# Patient Record
Sex: Male | Born: 1940 | Race: White | Hispanic: No | Marital: Married | Smoking: Former smoker
Health system: Southern US, Community
[De-identification: ages and names within clinical notes are randomized; demographics above are authoritative.]

## PROBLEM LIST (undated history)

## (undated) DIAGNOSIS — K219 Gastro-esophageal reflux disease without esophagitis: Secondary | ICD-10-CM

## (undated) DIAGNOSIS — I444 Left anterior fascicular block: Principal | ICD-10-CM

## (undated) DIAGNOSIS — E119 Type 2 diabetes mellitus without complications: Secondary | ICD-10-CM

## (undated) DIAGNOSIS — M1990 Unspecified osteoarthritis, unspecified site: Secondary | ICD-10-CM

## (undated) DIAGNOSIS — E7800 Pure hypercholesterolemia, unspecified: Secondary | ICD-10-CM

## (undated) DIAGNOSIS — M109 Gout, unspecified: Secondary | ICD-10-CM

## (undated) DIAGNOSIS — I10 Essential (primary) hypertension: Secondary | ICD-10-CM

## (undated) DIAGNOSIS — I2510 Atherosclerotic heart disease of native coronary artery without angina pectoris: Secondary | ICD-10-CM

## (undated) HISTORY — DX: Essential (primary) hypertension: I10

## (undated) HISTORY — PX: BACK SURGERY: SHX140

## (undated) HISTORY — PX: LUMBAR DISC SURGERY: SHX700

## (undated) HISTORY — DX: Gastro-esophageal reflux disease without esophagitis: K21.9

## (undated) HISTORY — DX: Pure hypercholesterolemia, unspecified: E78.00

## (undated) HISTORY — PX: INGUINAL HERNIA REPAIR: SUR1180

## (undated) HISTORY — PX: CARPAL TUNNEL RELEASE: SHX101

## (undated) HISTORY — DX: Left anterior fascicular block: I44.4

## (undated) HISTORY — PX: SHOULDER OPEN ROTATOR CUFF REPAIR: SHX2407

---

## 1999-02-27 ENCOUNTER — Observation Stay (HOSPITAL_COMMUNITY): Admission: RE | Admit: 1999-02-27 | Discharge: 1999-02-28 | Payer: Self-pay | Admitting: Orthopedic Surgery

## 2000-07-18 ENCOUNTER — Inpatient Hospital Stay (HOSPITAL_COMMUNITY): Admission: EM | Admit: 2000-07-18 | Discharge: 2000-07-19 | Payer: Self-pay | Admitting: Emergency Medicine

## 2000-07-18 ENCOUNTER — Encounter: Payer: Self-pay | Admitting: Emergency Medicine

## 2000-07-19 ENCOUNTER — Encounter: Payer: Self-pay | Admitting: Interventional Cardiology

## 2007-10-28 ENCOUNTER — Ambulatory Visit (HOSPITAL_COMMUNITY): Admission: RE | Admit: 2007-10-28 | Discharge: 2007-10-28 | Payer: Self-pay | Admitting: Surgery

## 2008-03-30 HISTORY — PX: CHOLECYSTECTOMY: SHX55

## 2010-08-15 NOTE — H&P (Signed)
[place]  Patient:    Steven Yates, Steven Yates                    MRN: [id number] Adm. Date:  [id number] Attending:  Lyn Records. Iii CC:         Darci Needle, M.D.  Brunilda Payor, M.D.   History and Physical  HISTORY OF PRESENT ILLNESS:  Mr. Dinneen is a 70 year old white gentleman with a history of gastroesophageal reflux and chest pain.  He is admitted with worsening chest pain over the past several days.  The patient has a history of chest pain in the past.  He had a heart catheterization by Dr. Katrinka Blazing in 1994.  He was found to have smooth and normal coronary arteries.  The patient has done fairly well over the years.  About one or two months ago, he started having exertional chest pain and exertional dyspnea.  These symptoms have worsened over the past several weeks.  Friday [place], [place] and had fairly significant episodes of exertional angina and exertional shortness of breath.  He had chest pain in the center of the chest radiating to his left shoulder and out his left arm. The pains resolved.  Last night, he awoke with similar episodes.  The pain persisted for an hour or so and he presented to the emergency room this morning.  He has been pain free since arriving at the emergency room.  The patient has been relatively active.  He works a lot out in his yard.  He has had some increasing shortness of breath and chest pain with his usual yard work.  CURRENT MEDICATIONS:  Prilosec 20 mg a day.  ALLERGIES:  He has no known drug allergies.  PAST MEDICAL HISTORY: 1. History of chest pain. 2. Gastroesophageal reflux.  SOCIAL HISTORY:  The patient smokes cigars occasionally.  He does not drink alcohol.  FAMILY HISTORY:  His brother has a history of coronary artery disease and is status post CABG.  REVIEW OF SYSTEMS:  Consistent with gastroesophageal reflux disease.  He has occasional episodes of  constipation.  PHYSICAL EXAMINATION:  GENERAL:  He is a middle-aged male in no acute distress.  He is alert and oriented x 3 and his mood and affect are normal.  VITAL SIGNS:  Temperature 98.3, blood pressure 152/79, heart rate 54.  HEENT:  2+ carotids.  There is no JVD.  NECK:  There is no thyromegaly and no lymphadenopathy.  LUNGS:  Clear to auscultation.  BACK:  Nontender.  HEART:  Regular rate.  S1, S2.  He has no murmurs, gallops, or rubs.  His PMI is non-displaced.  ABDOMEN:  Good bowel sounds.  He has no areas of tenderness and no hepatosplenomegaly.  EXTREMITIES:  He has no calf tenderness.  His pulses are 1-2+ and symmetric. There is no clubbing, cyanosis, or edema.  NEUROLOGIC:  Cranial nerves II-XII are intact and his motor and sensory functions are intact.  His gait was not assessed.  LABORATORY DATA:  His CPK is 187 with 4.0 MB.  His troponin is 0.01.  His CBC and his Chem-7 are within normal limits.  His EKG reveals sinus bradycardia with no ST or T wave changes.  IMPRESSION:  The patient presents with episodes of exertional chest pain. These symptoms are consistent with angina or possibly gastroesophageal reflux. He has had a normal heart catheterization back in 1994.  His EKG remains  normal.  PLAN:  At this point, we will admit him to the hospital and check serial CPKs. We will perform a stress Cardiolite study in the morning.  Dr. Katrinka Blazing will see him in the morning. DD:  07/18/00 TD:  07/19/00 Job: [id number]

## 2014-01-19 ENCOUNTER — Ambulatory Visit (INDEPENDENT_AMBULATORY_CARE_PROVIDER_SITE_OTHER): Payer: Medicare FFS | Admitting: Interventional Cardiology

## 2014-01-19 ENCOUNTER — Encounter: Payer: Self-pay | Admitting: Interventional Cardiology

## 2014-01-19 VITALS — BP 168/92 | HR 56 | Ht 71.0 in | Wt 208.8 lb

## 2014-01-19 DIAGNOSIS — R0789 Other chest pain: Secondary | ICD-10-CM

## 2014-01-19 DIAGNOSIS — R9431 Abnormal electrocardiogram [ECG] [EKG]: Secondary | ICD-10-CM

## 2014-01-19 DIAGNOSIS — I444 Left anterior fascicular block: Secondary | ICD-10-CM

## 2014-01-19 DIAGNOSIS — R0600 Dyspnea, unspecified: Secondary | ICD-10-CM | POA: Insufficient documentation

## 2014-01-19 DIAGNOSIS — R0609 Other forms of dyspnea: Secondary | ICD-10-CM

## 2014-01-19 DIAGNOSIS — K219 Gastro-esophageal reflux disease without esophagitis: Secondary | ICD-10-CM

## 2014-01-19 DIAGNOSIS — R079 Chest pain, unspecified: Secondary | ICD-10-CM

## 2014-01-19 HISTORY — DX: Left anterior fascicular block: I44.4

## 2014-01-19 HISTORY — DX: Gastro-esophageal reflux disease without esophagitis: K21.9

## 2014-01-19 NOTE — Progress Notes (Signed)
Patient ID: Steven Yates, male   DOB: Jun 25, 1940, 73 y.o.   MRN: [id number]   Date: 01/19/2014 Steven Yates, DOB Dec 21, 1940, MRN [id number] PCP: No primary provider on file.  Reason: Dyspnea on exertion  ASSESSMENT;  1. Dyspnea on exertion, progressive over the past 6 months. 2. Chronic intermittent episodes of chest pain with previous evaluations including a normal heart catheterization 1994 and a nonischemic myocardial perfusion study 2002 3. Abnormal EKG with left axis deviation compatible with left anterior hemiblock and QS pattern V1 and V2 either related to supple infarct or pseudo-infarct pattern from left anterior hemiblock. 4. Gastroesophageal reflux 5. Difficulty ambulating related to bilateral foot and ankle discomfort 6. Elevated blood pressure  PLAN:  1. Pharmacologic myocardial perfusion study 2. Patient states he is unable walk on a treadmill 3. Take an aspirin 81 mg daily 4. Consider CT of the lungs with contrast to rule out pulmonary embolism if cardiac evaluation is revealing.   SUBJECTIVE: [name] is a 73 y.o. male who is here for evaluation of dyspnea on exertion. Over the last 6 months he has noted that when he walks and his ER at and fields [place] farm, he develops dyspnea out of proportion to the level of activity. Walking up an incline is particularly bothersome. The dyspnea is not associated with discomfort, palpitations, or arm heaviness. He denies palpitations. He has not had associated orthopnea, PND, but has had right greater than left lower extremity edema. There is no history of pulmonary embolism or DVT. He denies palpitations. There is no claudication or leg discomfort with ambulation.  No Known Allergies  No current outpatient prescriptions on file prior to visit.   No current facility-administered medications on file prior to visit.    Past Medical History  Diagnosis Date  . Indigestion   . 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 Gastroesophageal reflux 01/19/2014    Past Surgical History  Procedure Laterality Date  . Rotator cuff repair    . Other surgical history  1966    Bilateral Hernia Repair  . Cardiac catheterization      Dr. Tamala Julian  . Cholecystectomy  2010    History   Social History  . Marital Status: Married    Spouse Name: N/A    Number of Children: N/A  . Years of Education: N/A   Occupational History  . Not on file.   Social History Main Topics  . Smoking status: Current Every Day Smoker    Types: Cigars  . Smokeless tobacco: Not on file  . Alcohol Use: No  . Drug Use: No  . Sexual Activity: Yes   Other Topics Concern  . Not on file   Social History Narrative  . No narrative on file    Family History  Problem Relation Age of Onset  . Emphysema Father   . Heart disease Mother   . Heart disease Brother   . Cancer Brother     ROS: No history of stroke, TIA, claudication, PND, melena, nausea, vomiting, abdominal pain, murmur, or documented heart disease to. Other systems negative for complaints.  OBJECTIVE: BP 168/92  Pulse 56  Ht 5\' 11"  (1.803 m)  Wt 208 lb 12.8 oz (94.711 kg)  BMI 29.13 kg/m2,  General: No acute distress, the patient and his stated age 37: normal without jaundice or pallor Neck: JVD flat. Carotids absent Chest: Clear Cardiac: Murmur: None. Gallop: S4. Rhythm:  Normal. Other: None Abdomen: Bruit: Absent. Pulsation: Absent Extremities: Edema: Absent. Pulses: 2+ and symmetric in the upper and lower extremities Neuro: Normal Psych: Normal/slightly anxious  ECG: Left anterior hemiblock with QS pattern V1 and V2 compatible with pseudoinfarction pattern versus infarct. Normal sinus rhythm.

## 2014-01-19 NOTE — Patient Instructions (Signed)
Your physician recommends that you continue on your current medications as directed. Please refer to the Current Medication list given to you today.  Your physician has requested that you have a lexiscan myoview. For further information please visit [email or web address]. Please follow instruction sheet, as given.  Follow up pending results

## 2014-01-26 ENCOUNTER — Encounter: Payer: Self-pay | Admitting: *Deleted

## 2014-01-26 ENCOUNTER — Encounter (HOSPITAL_COMMUNITY): Payer: Self-pay | Admitting: Pharmacy Technician

## 2014-01-26 ENCOUNTER — Ambulatory Visit (HOSPITAL_COMMUNITY): Payer: Medicare FFS | Attending: Cardiology | Admitting: Radiology

## 2014-01-26 ENCOUNTER — Telehealth: Payer: Self-pay | Admitting: *Deleted

## 2014-01-26 ENCOUNTER — Other Ambulatory Visit (INDEPENDENT_AMBULATORY_CARE_PROVIDER_SITE_OTHER): Payer: Medicare FFS | Admitting: *Deleted

## 2014-01-26 VITALS — BP 188/94 | Ht 71.0 in | Wt 206.0 lb

## 2014-01-26 DIAGNOSIS — R0600 Dyspnea, unspecified: Secondary | ICD-10-CM

## 2014-01-26 DIAGNOSIS — R079 Chest pain, unspecified: Secondary | ICD-10-CM

## 2014-01-26 DIAGNOSIS — R9439 Abnormal result of other cardiovascular function study: Secondary | ICD-10-CM | POA: Insufficient documentation

## 2014-01-26 DIAGNOSIS — R0609 Other forms of dyspnea: Secondary | ICD-10-CM

## 2014-01-26 DIAGNOSIS — Z5181 Encounter for therapeutic drug level monitoring: Secondary | ICD-10-CM

## 2014-01-26 DIAGNOSIS — I444 Left anterior fascicular block: Secondary | ICD-10-CM

## 2014-01-26 DIAGNOSIS — Z01812 Encounter for preprocedural laboratory examination: Secondary | ICD-10-CM

## 2014-01-26 DIAGNOSIS — R9431 Abnormal electrocardiogram [ECG] [EKG]: Secondary | ICD-10-CM

## 2014-01-26 LAB — BASIC METABOLIC PANEL
BUN: 14 mg/dL (ref 6–23)
CHLORIDE: 101 meq/L (ref 96–112)
CO2: 22 meq/L (ref 19–32)
CREATININE: 1 mg/dL (ref 0.4–1.5)
Calcium: 9.1 mg/dL (ref 8.4–10.5)
GFR: 78.18 mL/min (ref >60.00)
Glucose, Bld: 165 mg/dL — ABNORMAL HIGH (ref 70–99)
Potassium: 3.8 mEq/L (ref 3.5–5.1)
SODIUM: 135 meq/L (ref 135–145)

## 2014-01-26 LAB — CBC WITH DIFFERENTIAL/PLATELET
Basophils Absolute: 0 10*3/uL (ref 0.0–0.1)
Basophils Relative: 0.5 % (ref 0.0–3.0)
EOS ABS: 0.1 10*3/uL (ref 0.0–0.7)
Eosinophils Relative: 0.8 % (ref 0.0–5.0)
HCT: 42.8 % (ref 39.0–52.0)
HEMOGLOBIN: 14.2 g/dL (ref 13.0–17.0)
LYMPHS ABS: 2.2 10*3/uL (ref 0.7–4.0)
Lymphocytes Relative: 23.9 % (ref 12.0–46.0)
MCHC: 33.2 g/dL (ref 30.0–36.0)
MCV: 96.2 fl (ref 78.0–100.0)
MONO ABS: 0.6 10*3/uL (ref 0.1–1.0)
Monocytes Relative: 6.9 % (ref 3.0–12.0)
NEUTROS ABS: 6.2 10*3/uL (ref 1.4–7.7)
Neutrophils Relative %: 67.9 % (ref 43.0–77.0)
Platelets: 145 10*3/uL — ABNORMAL LOW (ref 150.0–400.0)
RBC: 4.45 Mil/uL (ref 4.22–5.81)
RDW: 13.5 % (ref 11.5–15.5)
WBC: 9.2 10*3/uL (ref 4.0–10.5)

## 2014-01-26 LAB — PROTIME-INR
INR: 1.1 ratio — ABNORMAL HIGH (ref 0.8–1.0)
Prothrombin Time: 11.9 s (ref 9.6–13.1)

## 2014-01-26 MED ORDER — REGADENOSON 0.4 MG/5ML IV SOLN
0.4000 mg | Freq: Once | INTRAVENOUS | Status: AC
  Administered 2014-01-26: 0.4 mg via INTRAVENOUS

## 2014-01-26 MED ORDER — TECHNETIUM TC 99M SESTAMIBI GENERIC - CARDIOLITE
30.0000 | Freq: Once | INTRAVENOUS | Status: AC | PRN
  Administered 2014-01-26: 30 via INTRAVENOUS

## 2014-01-26 MED ORDER — TECHNETIUM TC 99M SESTAMIBI GENERIC - CARDIOLITE
10.0000 | Freq: Once | INTRAVENOUS | Status: AC | PRN
  Administered 2014-01-26: 10 via INTRAVENOUS

## 2014-01-26 MED ORDER — ASPIRIN EC 81 MG PO TBEC: 81.0000 mg | DELAYED_RELEASE_TABLET | Freq: Every day | ORAL | Status: DC

## 2014-01-26 NOTE — Progress Notes (Addendum)
[facility] [place] [address] [place], [place] [address] [phone]    Cardiology Nuclear Med Study  Steven Yates is a 73 y.o. male     MRN : [id number]     DOB: [birth year]  Procedure Date: 01/26/2014  Nuclear Med Background Indication for Stress Test:  Evaluation for Ischemia and Abnormal EKG History:  '02 MPI: EF=61% and normal Cardiac Risk Factors: none  Symptoms:  Chest Pain and DOE   Nuclear Pre-Procedure Caffeine/Decaff Intake:  None NPO After: 7:00pm   Lungs:  clear O2 Sat: 97% on room air. IV 0.9% NS with Angio Cath:  22g  IV Site: R Hand  IV Started by:  Matilde Haymaker, RN  Chest Size (in):  46 Cup Size: n/a  Height: 5\' 11"  (1.803 m)  Weight:  206 lb (93.441 kg)  BMI:  Body mass index is 28.74 kg/(m^2). Tech Comments:  n/a    Nuclear Med Study 1 or 2 day study: 1 day  Stress Test Type:  Treadmill/Lexiscan  Reading MD: n/a  Order Authorizing Provider:  Mallie Mussel Smith,MD  Resting Radionuclide: Technetium 56m Sestamibi  Resting Radionuclide Dose: 11.0 mCi   Stress Radionuclide:  Technetium 18m Sestamibi  Stress Radionuclide Dose: 33.0 mCi           Stress Protocol Rest HR: 47 Stress HR: 68  Rest BP: 188/94 Stress BP: 201/98  Exercise Time (min): n/a METS: n/a           Dose of Adenosine (mg):  n/a Dose of Lexiscan: 0.4 mg  Dose of Atropine (mg): n/a Dose of Dobutamine: n/a mcg/kg/min (at max HR)  Stress Test Technologist: Matilde Haymaker, RN  Nuclear Technologist:  Earl Many, CNMT     Rest Procedure:  Myocardial perfusion imaging was performed at rest 45 minutes following the intravenous administration of Technetium 46m Sestamibi. Rest ECG: Normal sinus rhythm. Increased voltage with diffuse ST-T wave changes  Stress Procedure:  The patient received IV Lexiscan 0.4 mg over 15-seconds.  Technetium 29m Sestamibi injected at 30-seconds.  Quantitative spect images were obtained after a 45 minute delay. Dr. Harrington Challenger  consulted with images and hypertensive episode. Dr. Harrington Challenger spoke with patient and wife about images and heart catheterization on 01/30/14. Stress ECG: No significant change from baseline ECG  QPS Raw Data Images:  Normal; no motion artifact; normal heart/lung ratio. Stress Images:  Medium-sized area of moderately severe decreased uptake affecting the apical, apical septal segment, apical anterior segment, and the base anteroseptal segment. Rest Images:  Medium-sized area of mild decreased uptake affecting the apical, apical septal segment, apical anterior segment, and the base anteroseptal segment. Subtraction (SDS):  Quantitatively there is mild reversibility. Visually this appears more marked. Transient Ischemic Dilatation (Normal <1.22):  1.06 Lung/Heart Ratio (Normal <0.45):  0.30  Quantitative Gated Spect Images QGS EDV:  122 ml QGS ESV:  62 ml  Impression Exercise Capacity:  Lexiscan with no exercise. BP Response:  Hypertensive blood pressure response. Clinical Symptoms:  Patient had dyspnea and coughing. ECG Impression:  No significant ST segment change suggestive of ischemia. Comparison with Prior Nuclear Study: The study is compared with the report of the study from April, 2002  Overall Impression:  The study is abnormal. This is a moderate risk scan. There is suggestion of scar and ischemia in the anterior wall near the apex. The study was reviewed immediately by Dr. Harrington Challenger in the office. Catheterization has been scheduled. These findings represent a definite change from the  report of April, 2002.  LV Ejection Fraction: 49%.  LV Wall Motion:  There were no definite focal wall motion abnormalities.  Dola Argyle, MD

## 2014-01-26 NOTE — Telephone Encounter (Signed)
Patient with abnormal myoview. Reviewed by Dr. Harrington Challenger. Referred for left heart cath. Labs completed at this time. On cath schedule with Dr. Tamala Julian for Tue 11/3 at 9:00am.  Pt to arrive at 7:00am. ASA ordered by Dr. Harrington Challenger. Instruction letter printed and reviewed with patient and his wife. Patient verbalizes understanding and agreement.

## 2014-01-28 HISTORY — PX: CARDIAC CATHETERIZATION: SHX172

## 2014-01-30 ENCOUNTER — Other Ambulatory Visit: Payer: Self-pay | Admitting: Interventional Cardiology

## 2014-01-30 ENCOUNTER — Ambulatory Visit (HOSPITAL_COMMUNITY)
Admission: RE | Admit: 2014-01-30 | Discharge: 2014-01-30 | Disposition: A | Discharge status: Home / Self Care | Payer: Medicare FFS | Source: Ambulatory Visit | Attending: Interventional Cardiology | Admitting: Interventional Cardiology

## 2014-01-30 ENCOUNTER — Encounter (HOSPITAL_COMMUNITY)
Admission: RE | Disposition: A | Discharge status: Home / Self Care | Payer: Self-pay | Source: Ambulatory Visit | Attending: Interventional Cardiology

## 2014-01-30 DIAGNOSIS — R0600 Dyspnea, unspecified: Secondary | ICD-10-CM

## 2014-01-30 DIAGNOSIS — R0609 Other forms of dyspnea: Secondary | ICD-10-CM

## 2014-01-30 DIAGNOSIS — R030 Elevated blood-pressure reading, without diagnosis of hypertension: Secondary | ICD-10-CM | POA: Insufficient documentation

## 2014-01-30 DIAGNOSIS — I2510 Atherosclerotic heart disease of native coronary artery without angina pectoris: Secondary | ICD-10-CM | POA: Diagnosis not present

## 2014-01-30 DIAGNOSIS — F17210 Nicotine dependence, cigarettes, uncomplicated: Secondary | ICD-10-CM | POA: Insufficient documentation

## 2014-01-30 DIAGNOSIS — I2582 Chronic total occlusion of coronary artery: Secondary | ICD-10-CM | POA: Diagnosis not present

## 2014-01-30 DIAGNOSIS — I25119 Atherosclerotic heart disease of native coronary artery with unspecified angina pectoris: Secondary | ICD-10-CM

## 2014-01-30 DIAGNOSIS — R262 Difficulty in walking, not elsewhere classified: Secondary | ICD-10-CM | POA: Diagnosis not present

## 2014-01-30 DIAGNOSIS — K219 Gastro-esophageal reflux disease without esophagitis: Secondary | ICD-10-CM | POA: Diagnosis not present

## 2014-01-30 DIAGNOSIS — R9439 Abnormal result of other cardiovascular function study: Secondary | ICD-10-CM

## 2014-01-30 DIAGNOSIS — I25118 Atherosclerotic heart disease of native coronary artery with other forms of angina pectoris: Secondary | ICD-10-CM

## 2014-01-30 DIAGNOSIS — R079 Chest pain, unspecified: Secondary | ICD-10-CM | POA: Diagnosis present

## 2014-01-30 DIAGNOSIS — R0789 Other chest pain: Secondary | ICD-10-CM

## 2014-01-30 HISTORY — PX: PERCUTANEOUS CORONARY STENT INTERVENTION (PCI-S): SHX5485

## 2014-01-30 HISTORY — PX: LEFT HEART CATHETERIZATION WITH CORONARY ANGIOGRAM: SHX5451

## 2014-01-30 LAB — POCT ACTIVATED CLOTTING TIME
ACTIVATED CLOTTING TIME: 281 s
ACTIVATED CLOTTING TIME: 292 s

## 2014-01-30 SURGERY — LEFT HEART CATHETERIZATION WITH CORONARY ANGIOGRAM
Anesthesia: LOCAL

## 2014-01-30 MED ORDER — HEPARIN SODIUM (PORCINE) 1000 UNIT/ML IJ SOLN
INTRAMUSCULAR | Status: AC
  Filled 2014-01-30: qty 1

## 2014-01-30 MED ORDER — HEPARIN (PORCINE) IN NACL 2-0.9 UNIT/ML-% IJ SOLN
INTRAMUSCULAR | Status: AC
  Filled 2014-01-30: qty 1500

## 2014-01-30 MED ORDER — ASPIRIN 81 MG PO CHEW
81.0000 mg | CHEWABLE_TABLET | ORAL | Status: AC
  Administered 2014-01-30: 81 mg via ORAL

## 2014-01-30 MED ORDER — SODIUM CHLORIDE 0.9 % IJ SOLN: 3.0000 mL | Freq: Two times a day (BID) | INTRAMUSCULAR | Status: DC

## 2014-01-30 MED ORDER — SODIUM CHLORIDE 0.9 % IV SOLN: 250.0000 mL | INTRAVENOUS | Status: DC | PRN

## 2014-01-30 MED ORDER — MIDAZOLAM HCL 2 MG/2ML IJ SOLN
INTRAMUSCULAR | Status: AC
  Filled 2014-01-30: qty 2

## 2014-01-30 MED ORDER — SODIUM CHLORIDE 0.9 % IJ SOLN
3.0000 mL | INTRAMUSCULAR | Status: DC | PRN
  Administered 2014-01-30: 3 mL via INTRAVENOUS
  Filled 2014-01-30: qty 3

## 2014-01-30 MED ORDER — VERAPAMIL HCL 2.5 MG/ML IV SOLN
INTRAVENOUS | Status: AC
  Filled 2014-01-30: qty 2

## 2014-01-30 MED ORDER — LIDOCAINE HCL (PF) 1 % IJ SOLN
INTRAMUSCULAR | Status: AC
  Filled 2014-01-30: qty 30

## 2014-01-30 MED ORDER — ISOSORBIDE MONONITRATE ER 60 MG PO TB24: 60.0000 mg | ORAL_TABLET | Freq: Every day | ORAL | Status: DC

## 2014-01-30 MED ORDER — OXYCODONE-ACETAMINOPHEN 5-325 MG PO TABS: 1.0000 | ORAL_TABLET | ORAL | Status: DC | PRN

## 2014-01-30 MED ORDER — METOPROLOL SUCCINATE ER 50 MG PO TB24: 50.0000 mg | ORAL_TABLET | Freq: Every day | ORAL | Status: DC

## 2014-01-30 MED ORDER — SODIUM CHLORIDE 0.9 % IV SOLN: 1.0000 mL/kg/h | INTRAVENOUS | Status: DC

## 2014-01-30 MED ORDER — FENTANYL CITRATE 0.05 MG/ML IJ SOLN
INTRAMUSCULAR | Status: AC
  Filled 2014-01-30: qty 2

## 2014-01-30 MED ORDER — SODIUM CHLORIDE 0.9 % IV SOLN
INTRAVENOUS | Status: DC
Start: 2014-01-30 — End: 2014-01-30
  Administered 2014-01-30: 07:00:00 via INTRAVENOUS

## 2014-01-30 MED ORDER — NITROGLYCERIN 1 MG/10 ML FOR IR/CATH LAB
INTRA_ARTERIAL | Status: AC
  Filled 2014-01-30: qty 10

## 2014-01-30 MED ORDER — ASPIRIN 81 MG PO CHEW
CHEWABLE_TABLET | ORAL | Status: AC
  Filled 2014-01-30: qty 1

## 2014-01-30 NOTE — Discharge Instructions (Signed)
Radial Site Care °Refer to this sheet in the next few weeks. These instructions provide you with information on caring for yourself after your procedure. Your caregiver may also give you more specific instructions. Your treatment has been planned according to current medical practices, but problems sometimes occur. Call your caregiver if you have any problems or questions after your procedure. °HOME CARE INSTRUCTIONS °· You may shower the day after the procedure. Remove the bandage (dressing) and gently wash the site with plain soap and water. Gently pat the site dry. °· Do not apply powder or lotion to the site. °· Do not submerge the affected site in water for 3 to 5 days. °· Inspect the site at least twice daily. °· Do not flex or bend the affected arm for 24 hours. °· No lifting over 5 pounds (2.3 kg) for 5 days after your procedure. °· Do not drive home if you are discharged the same day of the procedure. Have someone else drive you. °· You may drive 24 hours after the procedure unless otherwise instructed by your caregiver. °· Do not operate machinery or power tools for 24 hours. °· A responsible adult should be with you for the first 24 hours after you arrive home. °What to expect: °· Any bruising will usually fade within 1 to 2 weeks. °· Blood that collects in the tissue (hematoma) may be painful to the touch. It should usually decrease in size and tenderness within 1 to 2 weeks. °SEEK IMMEDIATE MEDICAL CARE IF: °· You have unusual pain at the radial site. °· You have redness, warmth, swelling, or pain at the radial site. °· You have drainage (other than a small amount of blood on the dressing). °· You have chills. °· You have a fever or persistent symptoms for more than 72 hours. °· You have a fever and your symptoms suddenly get worse. °· Your arm becomes pale, cool, tingly, or numb. °· You have heavy bleeding from the site. Hold pressure on the site and call 911. °Document Released: 04/18/2010 Document  Revised: 06/08/2011 Document Reviewed: 04/18/2010 °ExitCare® Patient Information ©2015 [employer]. This information is not intended to replace advice given to you by your health care provider. Make sure you discuss any questions you have with your health care provider. ° °

## 2014-01-30 NOTE — CV Procedure (Signed)
Left Heart Catheterization with Coronary Angiography and PCI Report  DAILY CRATE  73 y.o.  male Apr 05, 1940  Procedure Date: 01/30/2014 Referring Physician: Valli Glance Blenda Bridegroom, M.D. Primary Cardiologist: history B Blenda Bridegroom, M.D.  INDICATIONS: Intermediate risk myocardial perfusion study, and moderate to severe exertional limitations due to dyspnea  PROCEDURE: 1. Left heart catheterization; 2. Coronary angiography; 3. Left ventriculography; 4. PTCA LAD total occlusion (unsuccessful)  CONSENT:  The risks, benefits, and details of the procedure were explained in detail to the patient. Risks including death, stroke, heart attack, kidney injury, allergy, limb ischemia, bleeding and radiation injury were discussed.  The patient verbalized understanding and wanted to proceed.  Informed written consent was obtained.  PROCEDURE TECHNIQUE:  After Xylocaine anesthesia a 5 French Slender sheath was placed in the right radial artery with an angiocath and the modified Seldinger technique.  Coronary angiography was done using a 5 F JR 4 and JL 3.5 cm diagnostic catheter.  Left ventriculography was done using the JR 4 catheter and hand injection.   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 There appeared to be a  micro-channel and there was left to left and right-to-left collaterals.  Because of the patient's symptoms we decided to attempt recanalization with wire probing. The patient was fully anticoagulated with heparin. A total of 12,000 units was administered. We then used an Ecologist guidewire within 6 [place] XB LAD to obtain guiding shots. We then directed the wire into the first diagonal that arises just proximal to the chronic occlusion. We then directed a 2.0x12 mm long Trek into the LAD over the sky wire proximal to the total occlusion. I then used a 3 g Miracle Brother Asahi  wire and probed the proximal cap of the total occlusion. Multiple passes were  made.  We will unable to establish any significant penetration into the occlusion. At this point the case was terminated with the idea being to refer the patient to the CTO team.  Hemostasis was achieved with a radial Wrist Band   CONTRAST:  Total of 190 cc.  COMPLICATIONS:  none   HEMODYNAMICS:  Aortic pressure 146/69 mmHg; LV pressure 148 over 14 mmHg; LVEDP 19 mmHg  ANGIOGRAPHIC DATA:   The left main coronary artery is widely patent.  The left anterior descending artery is moderately calcified in the proximal segment. There is 50% ostial narrowing. After the large and of the first diagonal there is total occlusion. There is a laterally directed nipple in the total occlusion and also the appearance of a microchannel that communicates approximately 8 mm with the lumen of the vessel. The LAD is collateralized from left to left and right-to-left collaterals. The totally occluded LAD accounts for an abnormality noted on scan and for the patient's dyspnea..  The left circumflex artery is patent and gives origin two small to moderate sized obtuse marginal branches. No significant obstruction is noted.  The ramus intermedius is a large vessel that branches on the lateral wall. Proximal tortuosity is noted. Luminal irregularities are noted. No significant obstruction is noted.  The right coronary artery is dominant vessel. It gives origin to a large PDA and 2 small left ventricular branches. Septal perforator collaterals to the LAD and noted although LAD filling is only faint.[name]  PCI RESULTS: attempted antegrade crossing of the proximal LAD total occlusion using a balloon catheter for support and a 3 g Miracle Brother wire was unsuccessful.  LEFT VENTRICULOGRAM:  Left ventricular  angiogram was done in the 30 RAO projection and revealed normal cavity size with EF 55-60%.   IMPRESSIONS:  1. 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 Intermediate risk  myocardial perfusion study is also noted. 2. Unsuccessful antegrade wiring of the LAD using conventional technique. 3. Widely patent circumflex, ramus, and RCA. Circumflex and ramus supply collaterals to the LAD as does the right coronary. 4. Normal LV function   RECOMMENDATION:  Start Imdur 60 mg daily Start metoprolol Succinate50 mg daily Nitroglycerin if dyspnea occurs spontaneously and is similar to that experienced during physical activity Referred to the CTO Team for consideration of advanced recanalization therapy to alleviate this patient's symptoms. [name]

## 2014-01-30 NOTE — Interval H&P Note (Signed)
Cath Lab Visit (complete for each Cath Lab visit)  Clinical Evaluation Leading to the Procedure:   ACS: No.  Non-ACS:    Anginal Classification: CCS III  Anti-ischemic medical therapy: Minimal Therapy (1 class of medications)  Non-Invasive Test Results: Intermediate-risk stress test findings: cardiac mortality 1-3%/year  Prior CABG: No previous CABG      History and Physical Interval Note:  01/30/2014 10:27 AM  Steven Yates  has presented today for surgery, with the diagnosis of abnormal myoview  The various methods of treatment have been discussed with the patient and family. After consideration of risks, benefits and other options for treatment, the patient has consented to  Procedure(s): LEFT HEART CATHETERIZATION WITH CORONARY ANGIOGRAM (N/A) as a surgical intervention .  The patient's history has been reviewed, patient examined, no change in status, stable for surgery.  I have reviewed the patient's chart and labs.  Questions were answered to the patient's satisfaction.     Sinclair Grooms

## 2014-01-30 NOTE — H&P (View-Only) (Signed)
Patient ID: Steven Yates, male   DOB: 1940/08/09, 73 y.o.   MRN: [id number]   Date: 01/19/2014 ID: Steven Yates, DOB Oct 17, 1940, MRN [id number] PCP: No primary provider on file.  Reason: Dyspnea on exertion  ASSESSMENT;  1. Dyspnea on exertion, progressive over the past 6 months. 2. Chronic intermittent episodes of chest pain with previous evaluations including a normal heart catheterization 1994 and a nonischemic myocardial perfusion study 2002 3. Abnormal EKG with left axis deviation compatible with left anterior hemiblock and QS pattern V1 and V2 either related to supple infarct or pseudo-infarct pattern from left anterior hemiblock. 4. Gastroesophageal reflux 5. Difficulty ambulating related to bilateral foot and ankle discomfort 6. Elevated blood pressure  PLAN:  1. Pharmacologic myocardial perfusion study 2. Patient states he is unable walk on a treadmill 3. Take an aspirin 81 mg daily 4. Consider CT of the lungs with contrast to rule out pulmonary embolism if cardiac evaluation is revealing.   SUBJECTIVE: Steven Yates is a 73 y.o. male who is here for evaluation of dyspnea on exertion. Over the last 6 months he has noted that when he walks and his ER at and fields [place] his [place], he develops dyspnea out of proportion to the level of activity. Walking up an incline is particularly bothersome. The dyspnea is not associated with discomfort, palpitations, or arm heaviness. He denies palpitations. He has not had associated orthopnea, PND, but has had right greater than left lower extremity edema. There is no history of pulmonary embolism or DVT. He denies palpitations. There is no claudication or leg discomfort with ambulation.  No Known Allergies  No current outpatient prescriptions on file prior to visit.   No current facility-administered medications on file prior to visit.    Past Medical History  Diagnosis Date  . Indigestion   . 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 Gastroesophageal reflux 01/19/2014    Past Surgical History  Procedure Laterality Date  . Rotator cuff repair    . Other surgical history  1966    Bilateral Hernia Repair  . Cardiac catheterization      Dr. Tamala Julian  . Cholecystectomy  2010    History   Social History  . Marital Status: Married    Spouse Name: N/A    Number of Children: N/A  . Years of Education: N/A   Occupational History  . Not on file.   Social History Main Topics  . Smoking status: Current Every Day Smoker    Types: Cigars  . Smokeless tobacco: Not on file  . Alcohol Use: No  . Drug Use: No  . Sexual Activity: Yes   Other Topics Concern  . Not on file   Social History Narrative  . No narrative on file    Family History  Problem Relation Age of Onset  . Emphysema Father   . Heart disease Mother   . Heart disease Brother   . Cancer Brother     ROS: No history of stroke, TIA, claudication, PND, melena, nausea, vomiting, abdominal pain, murmur, or documented heart disease to. Other systems negative for complaints.  OBJECTIVE: BP 168/92  Pulse 56  Ht 5\' 11"  (1.803 m)  Wt 208 lb 12.8 oz (94.711 kg)  BMI 29.13 kg/m2,  General: No acute distress, the patient and his stated age 58: normal without jaundice or pallor Neck: JVD flat. Carotids absent Chest: Clear Cardiac: Murmur: None. Gallop: S4. Rhythm:  Normal. Other: None Abdomen: Bruit: Absent. Pulsation: Absent Extremities: Edema: Absent. Pulses: 2+ and symmetric in the upper and lower extremities Neuro: Normal Psych: Normal/slightly anxious  ECG: Left anterior hemiblock with QS pattern V1 and V2 compatible with pseudoinfarction pattern versus infarct. Normal sinus rhythm.

## 2014-02-02 ENCOUNTER — Telehealth: Payer: Self-pay | Admitting: Interventional Cardiology

## 2014-02-02 NOTE — Telephone Encounter (Signed)
New message     Pt had a cath on Tuesday.   He has had a headache since then.  Could it be coming from the cath?

## 2014-02-02 NOTE — Telephone Encounter (Signed)
Returned pt wife call. She reports that pt has been having an ongoing headache since his cardiac cath on 11/3.pt was started in Isosorbide and Metoprolol. Pt does not have a fever, cough, chest pain, sob, swelling. Adv her that it is common to have  A headache when Isosorbide is taken.adv her that he should take Tylenol as needed for his headache.adv her that pt is adjusting to the medication, the headaches should last more than a 1-2 weeks.pt is to call the office if cardiac symptoms develop, or if he is unable to tolerate the headaches.pt wife verbalized understanding.

## 2014-02-06 ENCOUNTER — Telehealth: Payer: Self-pay | Admitting: Interventional Cardiology

## 2014-02-06 NOTE — Telephone Encounter (Signed)
returned pt wife call.adv her that Dr.Smith is currently out of the office. I will ask anothe r physician if they are willing to provide a jury excuse note for the pt.I will call back with an update. pt wife verbalized understanding

## 2014-02-06 NOTE — Telephone Encounter (Signed)
New message      Pt had jury duty today.  He is too sick to go.  Patient saw Dr Tamala Julian yesterday.  Please fax a note to [phone] attn: Cleotis Lema.  They need the note this am or they will issue a warrant for the patient.

## 2014-02-06 NOTE — Telephone Encounter (Signed)
pt wife aware  that Dr.Skains will provide a letter to excuse pt for jury duty. letter faxed to fax # provide by pt.fax # [phone] attn: Marcella Dubs.pt wife was thankful and verbalized understanding.

## 2014-03-01 NOTE — Progress Notes (Signed)
Patient ID: Steven Yates, male   DOB: 06/22/1940, 73 y.o.   MRN: [id number]    [address], [address] [place],   [address] Phone: [phone] Fax:  [phone]  Date:  03/02/2014   ID:  Steven Yates, DOB January 29, 1941, MRN [id number]  PCP:  Elba Barman, MD   ASSESSMENT:  1. 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 Improved on beta blocker therapy 2. Hypertension, essential 3. Mild untreated hyperlipidemia 4. Multiple medication intolerances  PLAN:  1. Start amlopine 5 mg daily 2. CTO consult with JV 3. F/U with me 3 months   SUBJECTIVE: Steven Yates is a 73 y.o. male who returns today 2-gauge clinical symptoms after being started on anti-ischemic therapy. He underwent coronary angiography after having an abnormal myocardial perfusion study. The perfusion study was done because of exertional fatigue and dyspnea. Catheterization demonstrated right total occlusion of the proximal LAD with right to left collaterals. Since starting medication mild improvement but unable to tolerate imdur   Wt Readings from Last 3 Encounters:  03/02/14 205 lb 12.8 oz (93.35 kg)  01/30/14 208 lb (94.348 kg)  01/26/14 206 lb (93.441 kg)     Past Medical History  Diagnosis Date  . Indigestion   . 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 Gastroesophageal reflux 01/19/2014    Current Outpatient Prescriptions  Medication Sig Dispense Refill  . aspirin EC 81 MG tablet Take 1 tablet (81 mg total) by mouth daily. 90 tablet 3  . atorvastatin (LIPITOR) 40 MG tablet Take 40 mg by mouth daily.     . metoprolol succinate (TOPROL-XL) 50 MG 24 hr tablet TAKE 1 TABLET BY MOUTH DAILY WITH OR IMMEDIATELY FOLLOWING A MEAL 90 tablet 3  . omeprazole (PRILOSEC) 40 MG capsule Take 40 mg by mouth daily.      No current facility-administered medications for this visit.    Allergies:   No Known  Allergies  Social History:  The patient  reports that he has been smoking Cigars.  He does not have any smokeless tobacco history on file. He reports that he does not drink alcohol or use illicit drugs.   ROS:  Please see the history of present illness.   No cath site problems. Not depressed   All other systems reviewed and negative.   OBJECTIVE: VS:  BP 148/90 mmHg  Pulse 49  Ht 5\' 11"  (1.803 m)  Wt 205 lb 12.8 oz (93.35 kg)  BMI 28.72 kg/m2 Well nourished, well developed, in no acute distress, healthy HEENT: normal Neck: JVD flat. Carotid bruit absent  Cardiac:  normal S1, S2; RRR; no murmur Lungs:  clear to auscultation bilaterally, no wheezing, rhonchi or rales Abd: soft, nontender, no hepatomegaly Ext: Edema none. Pulses 2+ Skin: warm and dry Neuro:  CNs 2-12 intact, no focal abnormalities noted  EKG:  none       Signed, Illene Labrador III, MD 03/02/2014 8:18 AM

## 2014-03-02 ENCOUNTER — Other Ambulatory Visit: Payer: Self-pay | Admitting: Interventional Cardiology

## 2014-03-02 ENCOUNTER — Encounter: Payer: Self-pay | Admitting: Interventional Cardiology

## 2014-03-02 ENCOUNTER — Ambulatory Visit (INDEPENDENT_AMBULATORY_CARE_PROVIDER_SITE_OTHER): Payer: Medicare FFS | Admitting: Interventional Cardiology

## 2014-03-02 VITALS — BP 148/90 | HR 49 | Ht 71.0 in | Wt 205.8 lb

## 2014-03-02 DIAGNOSIS — I444 Left anterior fascicular block: Secondary | ICD-10-CM

## 2014-03-02 DIAGNOSIS — R9439 Abnormal result of other cardiovascular function study: Secondary | ICD-10-CM

## 2014-03-02 DIAGNOSIS — I25118 Atherosclerotic heart disease of native coronary artery with other forms of angina pectoris: Secondary | ICD-10-CM

## 2014-03-02 DIAGNOSIS — R931 Abnormal findings on diagnostic imaging of heart and coronary circulation: Secondary | ICD-10-CM

## 2014-03-02 DIAGNOSIS — R0609 Other forms of dyspnea: Secondary | ICD-10-CM

## 2014-03-02 DIAGNOSIS — R0600 Dyspnea, unspecified: Secondary | ICD-10-CM

## 2014-03-02 MED ORDER — AMLODIPINE BESYLATE 5 MG PO TABS: 5.0000 mg | ORAL_TABLET | Freq: Every day | ORAL | Status: DC

## 2014-03-02 NOTE — Patient Instructions (Signed)
Your physician has recommended you make the following change in your medication:  1) START Amlodipine 5mg  daily. An Rx has been sent to your pharmacy  You have been referred to Dr.Varanasi for a consult for CTO evaluation  Your physician recommends that you schedule a follow-up appointment in: 3 months with Dr.Smith

## 2014-03-05 ENCOUNTER — Encounter: Payer: Self-pay | Admitting: Podiatry

## 2014-03-05 ENCOUNTER — Ambulatory Visit (INDEPENDENT_AMBULATORY_CARE_PROVIDER_SITE_OTHER): Payer: Medicare FFS | Admitting: Podiatry

## 2014-03-05 VITALS — BP 186/83 | HR 51 | Ht 71.0 in | Wt 205.0 lb

## 2014-03-05 DIAGNOSIS — M722 Plantar fascial fibromatosis: Secondary | ICD-10-CM

## 2014-03-05 DIAGNOSIS — M21969 Unspecified acquired deformity of unspecified lower leg: Secondary | ICD-10-CM

## 2014-03-05 NOTE — Progress Notes (Signed)
Subjective: Left heel pain 4-6 weeks. 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  Now the left heel pain is really bad. Especially in the morning when getting out of bed, it is really bad.   Objective: Pedal pulses are all palpable. Neurovascular status are within normal. No abnormal skin lesions. Positive of hypermobile first ray bilateral. Positive of bunion bilateral R. Positive of plantar heel pain bilateral. Tight Achilles tendon on right.  Assessment: Plantar fasciitis bilateral. Hypermobile first ray bilateral. Hallux valgus with bunion right. Ankle Equinus right. 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  Plan: Reviewed findings and available treatment options. Injection given on both heels. Injection consisted of mixture of 4 mg Dexamethasone, 4 mg Triamcinolone, and 1 cc of 0.5% Marcaine plain.  Patient tolerated well without difficulty.  Night Splint dispensed with instruction bilateral. Patient will return for custom orthotics.

## 2014-03-05 NOTE — Patient Instructions (Signed)
Seen for bilateral heel pain. Cortisone injection given on both heels. Night Splint dispensed for both feet.  Return for Administrator.

## 2014-03-08 ENCOUNTER — Encounter (HOSPITAL_COMMUNITY): Payer: Self-pay | Admitting: Interventional Cardiology

## 2014-03-09 ENCOUNTER — Telehealth: Payer: Self-pay | Admitting: Interventional Cardiology

## 2014-03-09 MED ORDER — METOPROLOL SUCCINATE ER 25 MG PO TB24: 25.0000 mg | ORAL_TABLET | Freq: Every day | ORAL | Status: DC

## 2014-03-09 NOTE — Telephone Encounter (Signed)
Pt reports his bp was 146/42 45bpm. Pt adv that Dr.Smith is currently out of the office. I will talk with another physician in the office and call back with their recommendation. Pt verbalized understanding  Pt given Jana Half recommendation to reduce Metoprolol to 25mg  daily. Pt to call the office next week if symptoms do not improve. Pt verbalized understanding  Pt adv that scheduling is aware that he needs a consult appt with Dr.Varanasi and they will call him to schedule

## 2014-03-09 NOTE — Telephone Encounter (Signed)
Returned pt call. Pt reports that he has been having dizzy spells since starting Amlodipine.pt denies any other symptoms.pt does not have a bp machine at home and does not measure his bp regularly. Pt reports that he has been taking all medications as prescribed. Asked pt if he would be able to go to his local pharmacy to get his bp checked.pt will go to his local [facility], adv pt I will call him back @ 4:30 for an update on bp reading.

## 2014-03-09 NOTE — Telephone Encounter (Signed)
New Msg    Pt would like a call back states he needs to discuss his medications. Pt having dizzy spells and tripping over things. [phone] pt number

## 2014-03-14 NOTE — Telephone Encounter (Signed)
I would agree with the recommendation by Mr. Rosalyn Gess. How is the patient doing?

## 2014-03-15 NOTE — Telephone Encounter (Signed)
Pt adv that Dr.Smith is agreement to the decrease and of Metoprolol to 25mg  daily.pt reports that he is doing well with the change, no reoccurrence of dizziness.  Per Dr.Smith pt needs a consult for CTO.  Dr.Jordan has the first availability for an office consult. Dr.Jordan nurse Malachy Mood will f/u with pt for a work in appt on 12/29 or 12/30  Pt is agreeable and thankful

## 2014-03-16 ENCOUNTER — Other Ambulatory Visit: Payer: Self-pay

## 2014-03-16 DIAGNOSIS — I25118 Atherosclerotic heart disease of native coronary artery with other forms of angina pectoris: Secondary | ICD-10-CM

## 2014-03-16 DIAGNOSIS — I444 Left anterior fascicular block: Secondary | ICD-10-CM

## 2014-03-16 NOTE — Telephone Encounter (Signed)
Spoke to patient CTO scheduled 04/04/14 at 12:00 noon.Appointment scheduled with Dr.Jordan 04/02/14 at 4:30 pm.Patient will have pre cath lab,cxr and receive CTO instructions at office visit.

## 2014-03-20 ENCOUNTER — Encounter (HOSPITAL_COMMUNITY): Payer: Self-pay | Admitting: Pharmacy Technician

## 2014-04-02 ENCOUNTER — Other Ambulatory Visit: Payer: Self-pay | Admitting: Cardiology

## 2014-04-02 ENCOUNTER — Ambulatory Visit (INDEPENDENT_AMBULATORY_CARE_PROVIDER_SITE_OTHER): Payer: PPO | Admitting: Cardiology

## 2014-04-02 ENCOUNTER — Encounter: Payer: Self-pay | Admitting: Cardiology

## 2014-04-02 VITALS — BP 156/84 | HR 60 | Ht 70.0 in | Wt 207.5 lb

## 2014-04-02 DIAGNOSIS — I209 Angina pectoris, unspecified: Secondary | ICD-10-CM

## 2014-04-02 DIAGNOSIS — R9439 Abnormal result of other cardiovascular function study: Secondary | ICD-10-CM

## 2014-04-02 DIAGNOSIS — R0600 Dyspnea, unspecified: Secondary | ICD-10-CM

## 2014-04-02 DIAGNOSIS — R931 Abnormal findings on diagnostic imaging of heart and coronary circulation: Secondary | ICD-10-CM | POA: Diagnosis not present

## 2014-04-02 DIAGNOSIS — I25118 Atherosclerotic heart disease of native coronary artery with other forms of angina pectoris: Secondary | ICD-10-CM

## 2014-04-02 DIAGNOSIS — R0609 Other forms of dyspnea: Secondary | ICD-10-CM | POA: Diagnosis not present

## 2014-04-02 MED ORDER — PANTOPRAZOLE SODIUM 40 MG PO TBEC: 40.0000 mg | DELAYED_RELEASE_TABLET | Freq: Every day | ORAL | Status: DC

## 2014-04-02 MED ORDER — CLOPIDOGREL BISULFATE 75 MG PO TABS: 75.0000 mg | ORAL_TABLET | Freq: Every day | ORAL | Status: DC

## 2014-04-02 NOTE — Progress Notes (Signed)
Steven Yates Date of Birth: Jul 09, 1940 Medical Record #[id number]  History of Present Illness: Steven Yates is seen at the request of Dr. Tamala Julian for consideration of CTO PCI. He is a pleasant 74 yo WM with symptoms of DOE and chest pain for probably 6 months. He also complains of excessive fatigue on exertion. He underwent a stress Myoview which was intermediate risk with anterior wall scar and ischemia and EF 49%. Cardiac cath showed occlusion of the LAD with right to left and left to left collaterals. Attempt was made at PCI but the lesion could not be crossed with a Miracle brothers wire with balloon back up. The patient remains symptomatic despite 2 antianginal drugs.     Medication List       This list is accurate as of: 04/02/14 10:09 PM.  Always use your most recent med list.               amLODipine 5 MG tablet  Commonly known as:  NORVASC  Take 1 tablet (5 mg total) by mouth daily.     aspirin EC 81 MG tablet  Take 1 tablet (81 mg total) by mouth daily.     atorvastatin 40 MG tablet  Commonly known as:  LIPITOR  Take 40 mg by mouth daily.     clopidogrel 75 MG tablet  Commonly known as:  PLAVIX  Take 1 tablet (75 mg total) by mouth daily.     metoprolol succinate 25 MG 24 hr tablet  Commonly known as:  TOPROL-XL  Take 1 tablet (25 mg total) by mouth daily. Take with or immediately following a meal.     NITROSTAT 0.4 MG SL tablet  Generic drug:  nitroGLYCERIN  Place 0.4 mg under the tongue every 5 (five) minutes as needed for chest pain.     pantoprazole 40 MG tablet  Commonly known as:  PROTONIX  Take 1 tablet (40 mg total) by mouth daily.        No Known Allergies  Past Medical History  Diagnosis Date  . Indigestion   . 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 Gastroesophageal reflux 01/19/2014  . HTN (hypertension)   . Hypercholesterolemia     Past Surgical History  Procedure Laterality Date  .  Rotator cuff repair    . Other surgical history  1966    Bilateral Hernia Repair  . Cardiac catheterization      Dr. Tamala Julian  . Cholecystectomy  2010  . Left heart catheterization with coronary angiogram N/A 01/30/2014    Procedure: LEFT HEART CATHETERIZATION WITH CORONARY ANGIOGRAM;  Surgeon: Sinclair Grooms, MD;  Location: [facility] CATH LAB;  Service: Cardiovascular;  Laterality: N/A;  . Percutaneous coronary stent intervention (pci-s)  01/30/2014    Procedure: PERCUTANEOUS CORONARY STENT INTERVENTION (PCI-S);  Surgeon: Sinclair Grooms, MD;  Location: [facility] CATH LAB;  Service: Cardiovascular;;    History   Social History  . Marital Status: Married    Spouse Name: N/A    Number of Children: 2  . Years of Education: N/A   Occupational History  . [employer]    Social History Main Topics  . Smoking status: Current Every Day Smoker    Types: Cigars  . Smokeless tobacco: None  . Alcohol Use: No  . Drug Use: No  . Sexual Activity: Yes   Other Topics Concern  . None   Social History Narrative  Family History  Problem Relation Age of Onset  . Emphysema Father   . Heart disease Mother   . Heart disease Brother   . Cancer Brother     Review of Systems: The review of systems is positive for symptoms of BPH with frequent urination.  All other systems were reviewed and are negative.  Physical Exam: BP 156/84 mmHg  Pulse 60  Ht 5\' 10"  (1.778 m)  Wt 207 lb 8 oz (94.121 kg)  BMI 29.77 kg/m2 Filed Weights   04/02/14 1622  Weight: 207 lb 8 oz (94.121 kg)  GENERAL:  Well appearing WM in NAD. HEENT:  PERRL, EOMI, sclera are clear. Oropharynx is clear. NECK:  No jugular venous distention, carotid upstroke brisk and symmetric, no bruits, no thyromegaly or adenopathy LUNGS:  Clear to auscultation bilaterally CHEST:  Unremarkable HEART:  RRR,  PMI not displaced or sustained,S1 and S2 within normal limits, no S3, no S4: no clicks, no rubs, no murmurs ABD:  Soft, nontender.  BS +, no masses or bruits. No hepatomegaly, no splenomegaly EXT:  2 + pulses throughout, no edema, no cyanosis no clubbing SKIN:  Warm and dry.  No rashes NEURO:  Alert and oriented x 3. Cranial nerves II through XII intact. PSYCH:  Cognitively intact    LABORATORY DATA: Lab Results  Component Value Date   WBC 9.2 01/26/2014   HGB 14.2 01/26/2014   HCT 42.8 01/26/2014   PLT 145.0* 01/26/2014   GLUCOSE 165* 01/26/2014   NA 135 01/26/2014   K 3.8 01/26/2014   CL 101 01/26/2014   CREATININE 1.0 01/26/2014   BUN 14 01/26/2014   CO2 22 01/26/2014   INR 1.1* 01/26/2014     Assessment / Plan: 1. CAD with CTO of the LAD. Still with class 3 angina despite optimal anginal therapy. Intermediate risk myoview. I have personally reviewed his cath data. I think his LAD is a reasonable candidate for attempt at CTO PCI. It is a fairly short occlusion with mild calcification. We discussed CTO PCI procedure including bilateral groin access. Given the length of procedure I would recommend a foley catheter. Will load with Plavix. Risk of the procedure were discussed at length including MI, CVA, perforation, need for emergent surgery, death, and bleeding. We also discussed that typically higher radiation and contrast use are in order but that this is monitored carefully. After discussion with the patient and his wife he is agreeable to proceed.  2. Dyslipidemia. On statin. 3. HTN

## 2014-04-02 NOTE — Patient Instructions (Signed)
Start Plavix- take 4 tablets (300 mg)  today then 75 mg daily  Stop Prilosec   Take Protonix 40 mg daily instead

## 2014-04-03 ENCOUNTER — Ambulatory Visit
Admission: RE | Admit: 2014-04-03 | Discharge: 2014-04-03 | Disposition: A | Discharge status: Home / Self Care | Payer: PPO | Source: Ambulatory Visit | Attending: Cardiology | Admitting: Cardiology

## 2014-04-03 DIAGNOSIS — R0600 Dyspnea, unspecified: Secondary | ICD-10-CM

## 2014-04-03 DIAGNOSIS — R9439 Abnormal result of other cardiovascular function study: Secondary | ICD-10-CM

## 2014-04-03 DIAGNOSIS — R0609 Other forms of dyspnea: Secondary | ICD-10-CM

## 2014-04-03 DIAGNOSIS — I25118 Atherosclerotic heart disease of native coronary artery with other forms of angina pectoris: Secondary | ICD-10-CM

## 2014-04-03 LAB — PROTIME-INR
INR: 1.07 (ref <1.50)
PROTHROMBIN TIME: 13.9 s (ref 11.6–15.2)

## 2014-04-04 ENCOUNTER — Encounter (HOSPITAL_COMMUNITY)
Admission: RE | Disposition: A | Discharge status: Home / Self Care | Payer: Self-pay | Source: Ambulatory Visit | Attending: Cardiology

## 2014-04-04 ENCOUNTER — Ambulatory Visit (HOSPITAL_COMMUNITY)
Admission: RE | Admit: 2014-04-04 | Discharge: 2014-04-05 | Disposition: A | Discharge status: Home / Self Care | Payer: PPO | Source: Ambulatory Visit | Attending: Cardiology | Admitting: Cardiology

## 2014-04-04 ENCOUNTER — Encounter (HOSPITAL_COMMUNITY): Payer: Self-pay | Admitting: General Practice

## 2014-04-04 DIAGNOSIS — F17210 Nicotine dependence, cigarettes, uncomplicated: Secondary | ICD-10-CM | POA: Diagnosis not present

## 2014-04-04 DIAGNOSIS — K219 Gastro-esophageal reflux disease without esophagitis: Secondary | ICD-10-CM | POA: Insufficient documentation

## 2014-04-04 DIAGNOSIS — E780 Pure hypercholesterolemia: Secondary | ICD-10-CM | POA: Diagnosis not present

## 2014-04-04 DIAGNOSIS — I25119 Atherosclerotic heart disease of native coronary artery with unspecified angina pectoris: Secondary | ICD-10-CM | POA: Diagnosis present

## 2014-04-04 DIAGNOSIS — I2582 Chronic total occlusion of coronary artery: Secondary | ICD-10-CM | POA: Insufficient documentation

## 2014-04-04 DIAGNOSIS — I10 Essential (primary) hypertension: Secondary | ICD-10-CM | POA: Diagnosis not present

## 2014-04-04 DIAGNOSIS — R9439 Abnormal result of other cardiovascular function study: Secondary | ICD-10-CM | POA: Diagnosis not present

## 2014-04-04 DIAGNOSIS — I25118 Atherosclerotic heart disease of native coronary artery with other forms of angina pectoris: Secondary | ICD-10-CM

## 2014-04-04 DIAGNOSIS — E785 Hyperlipidemia, unspecified: Secondary | ICD-10-CM | POA: Diagnosis not present

## 2014-04-04 DIAGNOSIS — Z7982 Long term (current) use of aspirin: Secondary | ICD-10-CM | POA: Insufficient documentation

## 2014-04-04 DIAGNOSIS — I209 Angina pectoris, unspecified: Secondary | ICD-10-CM | POA: Diagnosis present

## 2014-04-04 HISTORY — PX: CORONARY ANGIOPLASTY WITH STENT PLACEMENT: SHX49

## 2014-04-04 HISTORY — PX: CARDIAC CATHETERIZATION: SHX172

## 2014-04-04 HISTORY — DX: Atherosclerotic heart disease of native coronary artery without angina pectoris: I25.10

## 2014-04-04 LAB — CBC WITH DIFFERENTIAL/PLATELET
BASOS PCT: 0 % (ref 0–1)
Basophils Absolute: 0 10*3/uL (ref 0.0–0.1)
EOS ABS: 0.1 10*3/uL (ref 0.0–0.7)
EOS PCT: 1 % (ref 0–5)
HEMATOCRIT: 41.1 % (ref 39.0–52.0)
HEMOGLOBIN: 14.6 g/dL (ref 13.0–17.0)
LYMPHS ABS: 2.7 10*3/uL (ref 0.7–4.0)
Lymphocytes Relative: 31 % (ref 12–46)
MCH: 33.2 pg (ref 26.0–34.0)
MCHC: 35.5 g/dL (ref 30.0–36.0)
MCV: 93.4 fL (ref 78.0–100.0)
MPV: 9.3 fL — ABNORMAL LOW (ref 9.4–12.4)
Monocytes Absolute: 0.8 10*3/uL (ref 0.1–1.0)
Monocytes Relative: 9 % (ref 3–12)
NEUTROS ABS: 5.2 10*3/uL (ref 1.7–7.7)
NEUTROS PCT: 59 % (ref 43–77)
Platelets: 143 10*3/uL — ABNORMAL LOW (ref 150–400)
RBC: 4.4 MIL/uL (ref 4.22–5.81)
RDW: 14.3 % (ref 11.5–15.5)
WBC: 8.8 10*3/uL (ref 4.0–10.5)

## 2014-04-04 LAB — POCT ACTIVATED CLOTTING TIME
ACTIVATED CLOTTING TIME: 257 s
Activated Clotting Time: 626 seconds

## 2014-04-04 LAB — BASIC METABOLIC PANEL
BUN: 14 mg/dL (ref 6–23)
CALCIUM: 9.3 mg/dL (ref 8.4–10.5)
CHLORIDE: 99 meq/L (ref 96–112)
CO2: 29 mEq/L (ref 19–32)
Creat: 0.97 mg/dL (ref 0.50–1.35)
Glucose, Bld: 203 mg/dL — ABNORMAL HIGH (ref 70–99)
Potassium: 4.5 mEq/L (ref 3.5–5.3)
Sodium: 135 mEq/L (ref 135–145)

## 2014-04-04 SURGERY — CORONARY CTO INTERVENTION

## 2014-04-04 MED ORDER — NITROGLYCERIN 0.4 MG SL SUBL: 0.4000 mg | SUBLINGUAL_TABLET | SUBLINGUAL | Status: DC | PRN

## 2014-04-04 MED ORDER — HEPARIN SODIUM (PORCINE) 1000 UNIT/ML IJ SOLN
INTRAMUSCULAR | Status: AC
  Filled 2014-04-04: qty 1

## 2014-04-04 MED ORDER — NITROGLYCERIN 1 MG/10 ML FOR IR/CATH LAB
INTRA_ARTERIAL | Status: AC
  Filled 2014-04-04: qty 10

## 2014-04-04 MED ORDER — HEPARIN (PORCINE) IN NACL 2-0.9 UNIT/ML-% IJ SOLN
INTRAMUSCULAR | Status: AC
  Filled 2014-04-04: qty 1500

## 2014-04-04 MED ORDER — PANTOPRAZOLE SODIUM 40 MG PO TBEC
40.0000 mg | DELAYED_RELEASE_TABLET | Freq: Every day | ORAL | Status: DC
  Administered 2014-04-05: 40 mg via ORAL
  Filled 2014-04-04: qty 1

## 2014-04-04 MED ORDER — MIDAZOLAM HCL 2 MG/2ML IJ SOLN
INTRAMUSCULAR | Status: AC
  Filled 2014-04-04: qty 2

## 2014-04-04 MED ORDER — METOPROLOL SUCCINATE 12.5 MG HALF TABLET
12.5000 mg | ORAL_TABLET | Freq: Every day | ORAL | Status: DC
  Administered 2014-04-05: 12.5 mg via ORAL
  Filled 2014-04-04: qty 1

## 2014-04-04 MED ORDER — SODIUM CHLORIDE 0.9 % IV SOLN: 250.0000 mL | INTRAVENOUS | Status: DC | PRN

## 2014-04-04 MED ORDER — DIAZEPAM 5 MG PO TABS
5.0000 mg | ORAL_TABLET | Freq: Once | ORAL | Status: AC
  Administered 2014-04-04: 5 mg via ORAL

## 2014-04-04 MED ORDER — ATORVASTATIN CALCIUM 40 MG PO TABS
40.0000 mg | ORAL_TABLET | Freq: Every day | ORAL | Status: DC
  Administered 2014-04-05: 09:00:00 40 mg via ORAL
  Filled 2014-04-04: qty 1

## 2014-04-04 MED ORDER — ASPIRIN 81 MG PO CHEW
81.0000 mg | CHEWABLE_TABLET | Freq: Every day | ORAL | Status: DC
  Administered 2014-04-05: 09:00:00 81 mg via ORAL
  Filled 2014-04-04 (×2): qty 1

## 2014-04-04 MED ORDER — DIAZEPAM 5 MG/ML IJ SOLN: 5.0000 mg | Freq: Once | INTRAMUSCULAR | Status: AC

## 2014-04-04 MED ORDER — DIAZEPAM 5 MG PO TABS
ORAL_TABLET | ORAL | Status: AC
  Filled 2014-04-04: qty 1

## 2014-04-04 MED ORDER — ZOLPIDEM TARTRATE 5 MG PO TABS
5.0000 mg | ORAL_TABLET | Freq: Once | ORAL | Status: AC
  Administered 2014-04-04: 5 mg via ORAL
  Filled 2014-04-04: qty 1

## 2014-04-04 MED ORDER — SODIUM CHLORIDE 0.9 % IV SOLN: 1.0000 mL/kg/h | INTRAVENOUS | Status: AC

## 2014-04-04 MED ORDER — SODIUM CHLORIDE 0.9 % IJ SOLN: 3.0000 mL | INTRAMUSCULAR | Status: DC | PRN

## 2014-04-04 MED ORDER — SODIUM CHLORIDE 0.9 % IV SOLN
INTRAVENOUS | Status: DC
  Administered 2014-04-04: 11:00:00 via INTRAVENOUS

## 2014-04-04 MED ORDER — ASPIRIN 81 MG PO CHEW
81.0000 mg | CHEWABLE_TABLET | ORAL | Status: DC
  Filled 2014-04-04: qty 1

## 2014-04-04 MED ORDER — ACETAMINOPHEN 325 MG PO TABS: 650.0000 mg | ORAL_TABLET | ORAL | Status: DC | PRN

## 2014-04-04 MED ORDER — CLOPIDOGREL BISULFATE 75 MG PO TABS
75.0000 mg | ORAL_TABLET | ORAL | Status: DC
  Filled 2014-04-04: qty 1

## 2014-04-04 MED ORDER — CLOPIDOGREL BISULFATE 75 MG PO TABS
75.0000 mg | ORAL_TABLET | Freq: Every day | ORAL | Status: DC
  Administered 2014-04-05: 09:00:00 75 mg via ORAL
  Filled 2014-04-04: qty 1

## 2014-04-04 MED ORDER — FENTANYL CITRATE 0.05 MG/ML IJ SOLN
INTRAMUSCULAR | Status: AC
  Filled 2014-04-04: qty 2

## 2014-04-04 MED ORDER — LIDOCAINE HCL (PF) 1 % IJ SOLN
INTRAMUSCULAR | Status: AC
  Filled 2014-04-04: qty 60

## 2014-04-04 MED ORDER — ONDANSETRON HCL 4 MG/2ML IJ SOLN: 4.0000 mg | Freq: Four times a day (QID) | INTRAMUSCULAR | Status: DC | PRN

## 2014-04-04 MED ORDER — SODIUM CHLORIDE 0.9 % IJ SOLN: 3.0000 mL | Freq: Two times a day (BID) | INTRAMUSCULAR | Status: DC

## 2014-04-04 MED ORDER — AMLODIPINE BESYLATE 5 MG PO TABS
5.0000 mg | ORAL_TABLET | Freq: Every day | ORAL | Status: DC
  Administered 2014-04-05: 09:00:00 5 mg via ORAL
  Filled 2014-04-04: qty 1

## 2014-04-04 NOTE — H&P (View-Only) (Signed)
Vernon Prey Date of Birth: 02/03/1941 Medical Record #[id number]  History of Present Illness: Mr. Knoth is seen at the request of Dr. Tamala Julian for consideration of CTO PCI. He is a pleasant 74 yo WM with symptoms of DOE and chest pain for probably 6 months. He also complains of excessive fatigue on exertion. He underwent a stress Myoview which was intermediate risk with anterior wall scar and ischemia and EF 49%. Cardiac cath showed occlusion of the LAD with right to left and left to left collaterals. Attempt was made at PCI but the lesion could not be crossed with a Miracle brothers wire with balloon back up. The patient remains symptomatic despite 2 antianginal drugs.     Medication List       This list is accurate as of: 04/02/14 10:09 PM.  Always use your most recent med list.               amLODipine 5 MG tablet  Commonly known as:  NORVASC  Take 1 tablet (5 mg total) by mouth daily.     aspirin EC 81 MG tablet  Take 1 tablet (81 mg total) by mouth daily.     atorvastatin 40 MG tablet  Commonly known as:  LIPITOR  Take 40 mg by mouth daily.     clopidogrel 75 MG tablet  Commonly known as:  PLAVIX  Take 1 tablet (75 mg total) by mouth daily.     metoprolol succinate 25 MG 24 hr tablet  Commonly known as:  TOPROL-XL  Take 1 tablet (25 mg total) by mouth daily. Take with or immediately following a meal.     NITROSTAT 0.4 MG SL tablet  Generic drug:  nitroGLYCERIN  Place 0.4 mg under the tongue every 5 (five) minutes as needed for chest pain.     pantoprazole 40 MG tablet  Commonly known as:  PROTONIX  Take 1 tablet (40 mg total) by mouth daily.        No Known Allergies  Past Medical History  Diagnosis Date  . Indigestion   . 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 Gastroesophageal reflux 01/19/2014  . HTN (hypertension)   . Hypercholesterolemia     Past Surgical History  Procedure Laterality Date  .  Rotator cuff repair    . Other surgical history  1966    Bilateral Hernia Repair  . Cardiac catheterization      Dr. Tamala Julian  . Cholecystectomy  2010  . Left heart catheterization with coronary angiogram N/A 01/30/2014    Procedure: LEFT HEART CATHETERIZATION WITH CORONARY ANGIOGRAM;  Surgeon: Sinclair Grooms, MD;  Location: [facility] CATH LAB;  Service: Cardiovascular;  Laterality: N/A;  . Percutaneous coronary stent intervention (pci-s)  01/30/2014    Procedure: PERCUTANEOUS CORONARY STENT INTERVENTION (PCI-S);  Surgeon: Sinclair Grooms, MD;  Location: [facility] CATH LAB;  Service: Cardiovascular;;    History   Social History  . Marital Status: Married    Spouse Name: N/A    Number of Children: 2  . Years of Education: N/A   Occupational History  . [employer] company    Social History Main Topics  . Smoking status: Current Every Day Smoker    Types: Cigars  . Smokeless tobacco: None  . Alcohol Use: No  . Drug Use: No  . Sexual Activity: Yes   Other Topics Concern  . None   Social History Narrative  Family History  Problem Relation Age of Onset  . Emphysema Father   . Heart disease Mother   . Heart disease Brother   . Cancer Brother     Review of Systems: The review of systems is positive for symptoms of BPH with frequent urination.  All other systems were reviewed and are negative.  Physical Exam: BP 156/84 mmHg  Pulse 60  Ht 5\' 10"  (1.778 m)  Wt 207 lb 8 oz (94.121 kg)  BMI 29.77 kg/m2 Filed Weights   04/02/14 1622  Weight: 207 lb 8 oz (94.121 kg)  GENERAL:  Well appearing WM in NAD. HEENT:  PERRL, EOMI, sclera are clear. Oropharynx is clear. NECK:  No jugular venous distention, carotid upstroke brisk and symmetric, no bruits, no thyromegaly or adenopathy LUNGS:  Clear to auscultation bilaterally CHEST:  Unremarkable HEART:  RRR,  PMI not displaced or sustained,S1 and S2 within normal limits, no S3, no S4: no clicks, no rubs, no murmurs ABD:  Soft, nontender.  BS +, no masses or bruits. No hepatomegaly, no splenomegaly EXT:  2 + pulses throughout, no edema, no cyanosis no clubbing SKIN:  Warm and dry.  No rashes NEURO:  Alert and oriented x 3. Cranial nerves II through XII intact. PSYCH:  Cognitively intact    LABORATORY DATA: Lab Results  Component Value Date   WBC 9.2 01/26/2014   HGB 14.2 01/26/2014   HCT 42.8 01/26/2014   PLT 145.0* 01/26/2014   GLUCOSE 165* 01/26/2014   NA 135 01/26/2014   K 3.8 01/26/2014   CL 101 01/26/2014   CREATININE 1.0 01/26/2014   BUN 14 01/26/2014   CO2 22 01/26/2014   INR 1.1* 01/26/2014     Assessment / Plan: 1. CAD with CTO of the LAD. Still with class 3 angina despite optimal anginal therapy. Intermediate risk myoview. I have personally reviewed his cath data. I think his LAD is a reasonable candidate for attempt at CTO PCI. It is a fairly short occlusion with mild calcification. We discussed CTO PCI procedure including bilateral groin access. Given the length of procedure I would recommend a foley catheter. Will load with Plavix. Risk of the procedure were discussed at length including MI, CVA, perforation, need for emergent surgery, death, and bleeding. We also discussed that typically higher radiation and contrast use are in order but that this is monitored carefully. After discussion with the patient and his wife he is agreeable to proceed.  2. Dyslipidemia. On statin. 3. HTN

## 2014-04-04 NOTE — Interval H&P Note (Signed)
History and Physical Interval Note:  04/04/2014 3:02 PM  Vernon Prey  has presented today for surgery, with the diagnosis of cad  The various methods of treatment have been discussed with the patient and family. After consideration of risks, benefits and other options for treatment, the patient has consented to  Procedure(s): PERCUTANEOUS CORONARY STENT INTERVENTION (PCI-S) (N/A) as a surgical intervention .  The patient's history has been reviewed, patient examined, no change in status, stable for surgery.  I have reviewed the patient's chart and labs.  Questions were answered to the patient's satisfaction.    Cath Lab Visit (complete for each Cath Lab visit)  Clinical Evaluation Leading to the Procedure:   ACS: No.  Non-ACS:    Anginal Classification: CCS III  Anti-ischemic medical therapy: Maximal Therapy (2 or more classes of medications)  Non-Invasive Test Results: Intermediate-risk stress test findings: cardiac mortality 1-3%/year  Prior CABG: No previous CABG       Collier Salina [facility] 04/04/2014 3:02 PM

## 2014-04-04 NOTE — CV Procedure (Signed)
    CARDIAC CATH NOTE  Name: Steven Yates MRN: [id number] DOB: June 10, 1940  Procedure: CTO PCI and stenting of the LAD  Indication: 74 yo WM with CTO of the LAD following the first diagonal. He has refractory class 3 angina despite optimal medical therapy. Stress myoview study was intermediate risk with anterior wall ischemia.   Procedural Details: The both groins were prepped, draped, and anesthetized with 1% lidocaine. Using the modified Seldinger technique, an 8 Fr long sheath was introduced into the right femoral artery.  A 6 Fr sheath was introduced into the left femoral artery. Weight-based heparin was given for anticoagulation. Once a therapeutic ACT was achieved, a 6 [place] LA 0.75 guide catheter was inserted into the RCA.  An 8 Fr CLS 4 guide was inserted in the LCA. The LAD occlusion was immediately following the first diagonal. A Turnpike catheter was brought down to the proximal cap with a prowater wire. A Fielder XT coronary guidewire was used to cross the lesion in the subintimal space. The Turnpike catheter was exchanged for a CrossBoss catheter which was used to cross the lesion in the subintimal space. A Miracle Bros 6 wire was then placed through the CrossBoss and entered the true lumen distally. The CrossBoss was removed and the lesion was predilated with a 2.5 mm balloon.  The lesion was then stented with a 2.5 x 38 mm Promus  Stent covering the distal portion.  A second 3.0 x 16 mm Promus stent was used in an overlapping fashion for the proximal portion. The stent was postdilated with a 3.25 mm noncompliant balloon proximally.  Following PCI, there was 0% residual stenosis and TIMI-3 flow. Final angiography confirmed an excellent result. The patient tolerated the procedure well. There were no immediate procedural complications. Bilateral Femoral hemostasis was achieved with Angioseal devices x 2 with good hemostasis. The patient was transferred to the post catheterization recovery  area for further monitoring. 155 cc of contrast was used.  Lesion Data: Vessel: LAD Percent stenosis (pre): 100% CTO TIMI-flow (pre):  0 Stent:  3.0 x 16 and 2.5 x 38 mm Promus stents.  Percent stenosis (post): 0% TIMI-flow (post): 3  Conclusions: Successful CTO PCI of the LAD with DES x 2.   Recommendations: DAPT for at least one year. Anticipate DC in am if stable.   Peter Martinique, [place]  04/04/2014, 4:32 PM

## 2014-04-05 ENCOUNTER — Telehealth: Payer: Self-pay

## 2014-04-05 DIAGNOSIS — I2582 Chronic total occlusion of coronary artery: Secondary | ICD-10-CM | POA: Diagnosis not present

## 2014-04-05 DIAGNOSIS — I209 Angina pectoris, unspecified: Secondary | ICD-10-CM

## 2014-04-05 DIAGNOSIS — R931 Abnormal findings on diagnostic imaging of heart and coronary circulation: Secondary | ICD-10-CM

## 2014-04-05 DIAGNOSIS — I25118 Atherosclerotic heart disease of native coronary artery with other forms of angina pectoris: Secondary | ICD-10-CM

## 2014-04-05 DIAGNOSIS — R9439 Abnormal result of other cardiovascular function study: Secondary | ICD-10-CM | POA: Diagnosis not present

## 2014-04-05 DIAGNOSIS — K219 Gastro-esophageal reflux disease without esophagitis: Secondary | ICD-10-CM | POA: Diagnosis not present

## 2014-04-05 DIAGNOSIS — I10 Essential (primary) hypertension: Secondary | ICD-10-CM | POA: Diagnosis not present

## 2014-04-05 LAB — BASIC METABOLIC PANEL
ANION GAP: 6 (ref 5–15)
BUN: 13 mg/dL (ref 6–23)
CALCIUM: 9.1 mg/dL (ref 8.4–10.5)
CHLORIDE: 102 meq/L (ref 96–112)
CO2: 28 mmol/L (ref 19–32)
Creatinine, Ser: 0.95 mg/dL (ref 0.50–1.35)
GFR calc non Af Amer: 82 mL/min — ABNORMAL LOW (ref >90)
Glucose, Bld: 196 mg/dL — ABNORMAL HIGH (ref 70–99)
Potassium: 4.2 mmol/L (ref 3.5–5.1)
Sodium: 136 mmol/L (ref 135–145)

## 2014-04-05 LAB — CBC
HCT: 39.9 % (ref 39.0–52.0)
HEMOGLOBIN: 13.9 g/dL (ref 13.0–17.0)
MCH: 32.6 pg (ref 26.0–34.0)
MCHC: 34.8 g/dL (ref 30.0–36.0)
MCV: 93.7 fL (ref 78.0–100.0)
PLATELETS: 133 10*3/uL — AB (ref 150–400)
RBC: 4.26 MIL/uL (ref 4.22–5.81)
RDW: 13.5 % (ref 11.5–15.5)
WBC: 10.6 10*3/uL — ABNORMAL HIGH (ref 4.0–10.5)

## 2014-04-05 MED ORDER — METOPROLOL SUCCINATE ER 25 MG PO TB24: 12.5000 mg | ORAL_TABLET | Freq: Every day | ORAL | Status: DC

## 2014-04-05 NOTE — Progress Notes (Signed)
CARDIAC REHAB PHASE I   PRE:  Rate/Rhythm: 10 SR  BP:  Supine: 169/75  Sitting:   Standing:    SaO2:   MODE:  Ambulation: 1000 ft   POST:  Rate/Rhythm: 83 SR  BP:  Supine:   Sitting: 171/102 recheck 159/87  Standing:    SaO2:  0755-0910 Pt tolerated ambulation well without c/o of cp or SOB.  BP elevated before and after walk, but worse after walk. Rechecked BP after pt rested 159/87. Completed stent discharge education with pt. He voices understanding. Pt agrees to [place]. [facility] in [place], will send referral.We discussed smoking cessation. Pt seems committed to quitting. I gave him tips for quitting, coaching contact number and quit smart class. He seems committed to making lifestyle changes.  Rodney Langton RN 04/05/2014 9:11 AM

## 2014-04-05 NOTE — Discharge Summary (Signed)
Physician Discharge Summary     Cardiologist:  Tamala Julian Patient ID: COBI DELPH MRN: [id number] DOB/AGE: 72/05/1940 72 y.o.  Admit date: 04/04/2014 Discharge date: 04/05/2014  Admission Diagnoses:  Abnormal nuclear stress test, Angina pectoris  Discharge Diagnoses:  Active Problems:   Abnormal nuclear stress test   CAD (coronary artery disease), native coronary artery   Angina pectoris   Discharged Condition: stable  Hospital Course:   Mr. Gibler was seen by Dr. Patsy Lager at the request of Dr. Tamala Julian for consideration of CTO PCI. He is a pleasant 74 yo WMWM with symptoms of DOE and chest pain for probably 6 months. He also complains of excessive fatigue on exertion. He underwent a stress Myoview which was intermediate risk with anterior wall scar and ischemia and EF 49%. Cardiac cath showed occlusion of the LAD with right to left and left to left collaterals. Attempt was made at PCI but the lesion could not be crossed with a Miracle brothers wire with balloon back up.  The patient was brought back for CTO and underwent stenting of the LAD after the first diag with two Promus stents. ASA, plavix, metoprolol 12.5bid, lipitor 40, amlodipine 5. Left groin oozed for awhile last night. It is soft and nontender.  BP not well controlled but he has not had any meds. Follow up in the office.   He ambulated very well with CR. Smoking cessation was discussed.  The patient was seen by Dr. Tamala Julian who felt he was stable for DC home.   Consults: None  Significant Diagnostic Studies:  CARDIAC CATH NOTE  Name: SHAUGHN THOMLEY MRN: [id number] DOB: Apr 07, 1940  Procedure: CTO PCI and stenting of the LAD  Indication: 74 yo WM with CTO of the LAD following the first diagonal. He has refractory class 3 angina despite optimal medical therapy. Stress myoview study was intermediate risk with anterior wall ischemia.   Procedural Details: The both groins were prepped, draped, and anesthetized with 1%  lidocaine. Using the modified Seldinger technique, an 8 Fr long sheath was introduced into the right femoral artery. A 6 Fr sheath was introduced into the left femoral artery. Weight-based heparin was given for anticoagulation. Once a therapeutic ACT was achieved, a 6 [place] LA 0.75 guide catheter was inserted into the RCA. An 8 Fr CLS 4 guide was inserted in the LCA. The LAD occlusion was immediately following the first diagonal. A Turnpike catheter was brought down to the proximal cap with a prowater wire. A Fielder XT coronary guidewire was used to cross the lesion in the subintimal space. The Turnpike catheter was exchanged for a CrossBoss catheter which was used to cross the lesion in the subintimal space. A Miracle Bros 6 wire was then placed through the CrossBoss and entered the true lumen distally. The CrossBoss was removed and the lesion was predilated with a 2.5 mm balloon. The lesion was then stented with a 2.5 x 38 mm Promus Stent covering the distal portion. A second 3.0 x 16 mm Promus stent was used in an overlapping fashion for the proximal portion. The stent was postdilated with a 3.25 mm noncompliant balloon proximally. Following PCI, there was 0% residual stenosis and TIMI-3 flow. Final angiography confirmed an excellent result. The patient tolerated the procedure well. There were no immediate procedural complications. Bilateral Femoral hemostasis was achieved with Angioseal devices x 2 with good hemostasis. The patient was transferred to the post catheterization recovery area for further monitoring. 155 cc of contrast was used.  Lesion Data:  Vessel: LAD Percent stenosis (pre): 100% CTO TIMI-flow (pre): 0 Stent: 3.0 x 16 and 2.5 x 38 mm Promus stents.  Percent stenosis (post): 0% TIMI-flow (post): 3  Conclusions: Successful CTO PCI of the LAD with DES x 2.   Recommendations: DAPT for at least one year. Anticipate DC in am if stable.   Peter Martinique,  [place]  04/04/2014,  Treatments: See above  Discharge Exam: Blood pressure 169/75, pulse 67, temperature 97.6 F (36.4 C), temperature source Oral, resp. rate 20, height 5\' 10"  (1.778 m), weight 207 lb 7.3 oz (94.1 kg), SpO2 95 %.   Disposition: 01-Home or Self Care      Discharge Instructions    Amb Referral to Cardiac Rehabilitation    Complete by:  As directed      Diet - low sodium heart healthy    Complete by:  As directed      Discharge instructions    Complete by:  As directed   No lifting more than a half gallon of milk or driving for three days.     Increase activity slowly    Complete by:  As directed             Medication List    TAKE these medications        amLODipine 5 MG tablet  Commonly known as:  NORVASC  Take 1 tablet (5 mg total) by mouth daily.     aspirin EC 81 MG tablet  Take 1 tablet (81 mg total) by mouth daily.     atorvastatin 40 MG tablet  Commonly known as:  LIPITOR  Take 40 mg by mouth daily.     clopidogrel 75 MG tablet  Commonly known as:  PLAVIX  Take 1 tablet (75 mg total) by mouth daily.     metoprolol succinate 25 MG 24 hr tablet  Commonly known as:  TOPROL-XL  Take 0.5 tablets (12.5 mg total) by mouth daily. Take with or immediately following a meal.     NITROSTAT 0.4 MG SL tablet  Generic drug:  nitroGLYCERIN  Place 0.4 mg under the tongue every 5 (five) minutes as needed for chest pain.     pantoprazole 40 MG tablet  Commonly known as:  PROTONIX  Take 1 tablet (40 mg total) by mouth daily.       Follow-up Information    Follow up with Peter Martinique, MD On 04/19/2014.   Specialty:  Cardiology   Why:  2:45 PM   Contact information:   [place] [address] [phone]      Greater than 30 minutes was spent completing the patient's discharge.    SignedTarri Fuller, [place] 04/05/2014, 9:39 AM

## 2014-04-05 NOTE — Progress Notes (Signed)
    Subjective: Did not get much sleep.  No other complaints.  Objective: Vital signs in last 24 hours: Temp:  [97.5 F (36.4 C)-99.4 F (37.4 C)] 97.5 F (36.4 C) (01/07 0618) Pulse Rate:  [51-62] 60 (01/07 0618) Resp:  [16-20] 20 (01/07 0618) BP: (131-186)/(53-131) 156/79 mmHg (01/07 0618) SpO2:  [97 %-100 %] 97 % (01/07 0618) Weight:  [207 lb (93.895 kg)-207 lb 7.3 oz (94.1 kg)] 207 lb 7.3 oz (94.1 kg) (01/07 0100) Last BM Date: 04/04/14  Intake/Output from previous day: 01/06 0701 - 01/07 0700 In: 1314.4 [P.O.:240; I.V.:1074.4] Out: 1300 [Urine:1300] Intake/Output this shift:    Medications Current Facility-Administered Medications  Medication Dose Route Frequency Provider Last Rate Last Dose  . acetaminophen (TYLENOL) tablet 650 mg  650 mg Oral Q4H PRN Peter M Martinique, MD      . amLODipine (NORVASC) tablet 5 mg  5 mg Oral Daily Peter M Martinique, MD      . aspirin chewable tablet 81 mg  81 mg Oral Daily Peter M Martinique, MD      . atorvastatin (LIPITOR) tablet 40 mg  40 mg Oral Daily Peter M Martinique, MD      . clopidogrel (PLAVIX) tablet 75 mg  75 mg Oral Q breakfast Peter M Martinique, MD      . metoprolol succinate (TOPROL-XL) 24 hr tablet 12.5 mg  12.5 mg Oral Daily Peter M Martinique, MD      . nitroGLYCERIN (NITROSTAT) SL tablet 0.4 mg  0.4 mg Sublingual Q5 min PRN Peter M Martinique, MD      . ondansetron [facility]) injection 4 mg  4 mg Intravenous Q6H PRN Peter M Martinique, MD      . pantoprazole (PROTONIX) EC tablet 40 mg  40 mg Oral Daily Peter M Martinique, MD        PE: General appearance: alert, cooperative and no distress Lungs: clear to auscultation bilaterally Heart: regular rate and rhythm, S1, S2 normal, no murmur, click, rub or gallop Extremities: No LEE Pulses: 2+ and symmetric Skin: Warm and dry.  Right and left groins:  Both are soft.  Mild ecchymosis.  Nontender Neurologic: Grossly normal  Lab Results:   Recent Labs  04/03/14 0912 04/05/14 0525  WBC 8.8 10.6*    HGB 14.6 13.9  HCT 41.1 39.9  PLT 143* 133*   BMET  Recent Labs  04/03/14 0917 04/05/14 0525  NA 135 136  K 4.5 4.2  CL 99 102  CO2 29 28  GLUCOSE 203* 196*  BUN 14 13  CREATININE 0.97 0.95  CALCIUM 9.3 9.1   PT/INR  Recent Labs  04/03/14 0906  LABPROT 13.9  INR 1.07    Assessment/Plan   Active Problems:   Abnormal nuclear stress test   CAD (coronary artery disease), native coronary artery   Angina pectoris   Plan:   SP LHC and CTO and stenting of the LAD after the first diag with two Promus stents.  ASA, plavix, metoprolol 12.5bid, lipitor 40, amlodipine 5.  Left groin oozed for awhile last night.  It is soft and nontender.  Ambulate and DC home.  BP not well controlled but he has not had any meds.  Follow up in the office.    LOS: 1 day    Laasia Arcos PA-C 04/05/2014 7:16 AM

## 2014-04-05 NOTE — Progress Notes (Addendum)
Patient post cath bilateral groin sites level 1 soft mild bruising. Left groin oozed for awhile pressure held and patient kept on bedrest thru the night. No bleeding yet noticed this morning.

## 2014-04-05 NOTE — Telephone Encounter (Signed)
Received call from patient he stated he was discharged from hospital this morning.Stated he has had to change band aid twice.Stated left groin cath site slightly bleeding bright red blood.Spoke to DOD Dr.Kelly he advised to lie flat and have someone apply constant pressure to left groin for 20 to 30 mins.Advised to go to ER if continues to bleed.

## 2014-04-06 ENCOUNTER — Ambulatory Visit (INDEPENDENT_AMBULATORY_CARE_PROVIDER_SITE_OTHER): Payer: PPO | Admitting: Interventional Cardiology

## 2014-04-06 ENCOUNTER — Encounter: Payer: Self-pay | Admitting: Interventional Cardiology

## 2014-04-06 VITALS — BP 148/92 | HR 63 | Ht 70.0 in | Wt 203.0 lb

## 2014-04-06 DIAGNOSIS — R0600 Dyspnea, unspecified: Secondary | ICD-10-CM

## 2014-04-06 DIAGNOSIS — R0609 Other forms of dyspnea: Secondary | ICD-10-CM

## 2014-04-06 DIAGNOSIS — S301XXA Contusion of abdominal wall, initial encounter: Secondary | ICD-10-CM

## 2014-04-06 DIAGNOSIS — I25118 Atherosclerotic heart disease of native coronary artery with other forms of angina pectoris: Secondary | ICD-10-CM

## 2014-04-06 MED ORDER — METOPROLOL SUCCINATE ER 25 MG PO TB24: 25.0000 mg | ORAL_TABLET | Freq: Every day | ORAL | Status: DC

## 2014-04-06 NOTE — Patient Instructions (Addendum)
Your physician has recommended you make the following change in your medication:  1) INCREASE Metoprolol back to 25mg  daily Take all other medications as prescribed  Minimize activity for the next 24 hours. Avoid stairs, Try to keep the bandage off, so that the wound can begin healing.  You have a follow up appointment scheduled on 05/08/14 @ 11:45am

## 2014-04-06 NOTE — Progress Notes (Signed)
Patient ID: Steven Yates, male   DOB: [birth year], 74 y.o.   MRN: [id number]    [address], [address] [place], [place]  [address] Phone: [phone] Fax:  [phone]  Date:  04/06/2014   ID:  Steven Yates, DOB 30-May-1940, MRN [id number]  PCP:  Elba Barman, MD   ASSESSMENT:  1. Oozing left femoral cath site with ecchymosis. Right cath site ecchymosis. Minimal if any hematoma at either site 2. CAD status post LAD CTO recanalization by Dr. Martinique 3. Essential hypertension, poorly controlled  PLAN:  1. Minimize physical activity for another 24 hours 2. Clinical follow-up with me in 4 weeks 3. Increase metoprolol succinate back to 25 mg daily   SUBJECTIVE: Steven Yates is a 74 y.o. male who came in with his wife today who is being follow-up for atrial fibrillation. He underwent LAD CTO on Wednesday with success. Last evening he noticed oozing from the left groin cath site. There is no significant swelling. He does note bruising in both the left and right femoral region. He denies angina. He denies dyspnea.   Wt Readings from Last 3 Encounters:  04/06/14 203 lb (92.08 kg)  04/05/14 207 lb 7.3 oz (94.1 kg)  04/02/14 207 lb 8 oz (94.121 kg)     Past Medical History  Diagnosis Date  . 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 Gastroesophageal reflux 01/19/2014  . HTN (hypertension)   . Hypercholesterolemia   . Coronary artery disease     Current Outpatient Prescriptions  Medication Sig Dispense Refill  . amLODipine (NORVASC) 5 MG tablet Take 1 tablet (5 mg total) by mouth daily. 30 tablet 11  . aspirin EC 81 MG tablet Take 1 tablet (81 mg total) by mouth daily. 90 tablet 3  . atorvastatin (LIPITOR) 40 MG tablet Take 40 mg by mouth daily.     . clopidogrel (PLAVIX) 75 MG tablet Take 1 tablet (75 mg total) by mouth daily. 90 tablet 3  . metoprolol succinate (TOPROL-XL) 25 MG 24 hr tablet Take 1 tablet (25 mg  total) by mouth daily. Take with or immediately following a meal.    . NITROSTAT 0.4 MG SL tablet Place 0.4 mg under the tongue every 5 (five) minutes as needed for chest pain.   0  . pantoprazole (PROTONIX) 40 MG tablet Take 1 tablet (40 mg total) by mouth daily. 30 tablet 11   No current facility-administered medications for this visit.    Allergies:   No Known Allergies  Social History:  The patient  reports that he has been smoking Cigars.  He has never used smokeless tobacco. He reports that he does not drink alcohol or use illicit drugs.   ROS:  Please see the history of present illness.   No bleeding or other complaints   All other systems reviewed and negative.   OBJECTIVE: VS:  BP 148/92 mmHg  Pulse 63  Ht 5\' 10"  (1.778 m)  Wt 203 lb (92.08 kg)  BMI 29.13 kg/m2 Well nourished, well developed, in no acute distress, anxious appearing HEENT: normal Neck: JVD flat. Carotid bruit absent  Cardiac:  normal S1, S2; RRR; no murmur Lungs:  clear to auscultation bilaterally, no wheezing, rhonchi or rales Abd: soft, nontender, no hepatomegaly Ext: Edema absent. Pulses 2+. Large bilateral ecchymoses. Losing from the left cath site arteriotomy entry. No significant hematoma in either groin. Skin: warm and dry Neuro:  CNs 2-12  intact, no focal abnormalities noted  EKG:  Not performed       Signed, Illene Labrador III, MD 04/06/2014 9:27 AM

## 2014-04-11 ENCOUNTER — Telehealth: Payer: Self-pay

## 2014-04-11 NOTE — Telephone Encounter (Signed)
The patient is taking 5 mg of amlodipine daily. I went over the medications with him. Please ignore this question from the patient, as he had confusion about the medications.

## 2014-04-11 NOTE — Telephone Encounter (Signed)
Pt called in. He needs clarification of his medications. Amlodipine was prescribed by Dr.Smith on 03/03/14. Pt sts that he was nos aware, he never started this medication and would like to know if Dr.Smith still thinks he needs it. Adv him I will fwd a message to Dr.Smith and call back with his recommendation

## 2014-04-19 ENCOUNTER — Ambulatory Visit: Payer: Medicare FFS | Admitting: Cardiology

## 2014-05-08 ENCOUNTER — Encounter: Payer: Self-pay | Admitting: Interventional Cardiology

## 2014-05-08 ENCOUNTER — Ambulatory Visit (INDEPENDENT_AMBULATORY_CARE_PROVIDER_SITE_OTHER): Payer: PPO | Admitting: Interventional Cardiology

## 2014-05-08 VITALS — BP 158/86 | HR 56 | Ht 70.0 in | Wt 208.0 lb

## 2014-05-08 DIAGNOSIS — I444 Left anterior fascicular block: Secondary | ICD-10-CM

## 2014-05-08 DIAGNOSIS — I209 Angina pectoris, unspecified: Secondary | ICD-10-CM

## 2014-05-08 DIAGNOSIS — I25118 Atherosclerotic heart disease of native coronary artery with other forms of angina pectoris: Secondary | ICD-10-CM

## 2014-05-08 DIAGNOSIS — R0609 Other forms of dyspnea: Secondary | ICD-10-CM

## 2014-05-08 DIAGNOSIS — R0600 Dyspnea, unspecified: Secondary | ICD-10-CM

## 2014-05-08 MED ORDER — AMLODIPINE BESYLATE 10 MG PO TABS
10.0000 mg | ORAL_TABLET | Freq: Every day | ORAL | Status: DC
Start: 2014-05-08 — End: 2014-09-07

## 2014-05-08 NOTE — Patient Instructions (Signed)
Your physician has recommended you make the following change in your medication:  1) INCREASE Amlodipine to 10mg  daily. An Rx has been sent to your pharmacy  Your physician discussed the importance of regular exercise and recommended that you start or continue a regular exercise program for good health.  Limit your sodium intake to no more than 2,000 mg daily   Your physician recommends that you schedule a follow-up appointment in: 3 months with Dr.Smith

## 2014-05-08 NOTE — Progress Notes (Signed)
Cardiology Office Note   Date:  05/08/2014   ID:  Steven Yates, DOB 1940/06/01, MRN [id number]  PCP:  Elba Barman, MD  Cardiologist:   Sinclair Grooms, MD   No chief complaint on file.     History of Present Illness: Steven Yates is a 74 y.o. male who presents for  breathing has improved.  Stabbing chest pain. Diet is unlimited. He smokes cigars. He is not being physically active. He has not needed to use nitroglycerin.   Past Medical History  Diagnosis Date  . 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 Gastroesophageal reflux 01/19/2014  . HTN (hypertension)   . Hypercholesterolemia   . Coronary artery disease     Past Surgical History  Procedure Laterality Date  . Shoulder open rotator cuff repair Right 1990's  . Cholecystectomy  2010  . Left heart catheterization with coronary angiogram N/A 01/30/2014    Procedure: LEFT HEART CATHETERIZATION WITH CORONARY ANGIOGRAM;  Surgeon: Sinclair Grooms, MD;  Location: [facility] CATH LAB;  Service: Cardiovascular;  Laterality: N/A;  . Percutaneous coronary stent intervention (pci-s)  01/30/2014    Procedure: PERCUTANEOUS CORONARY STENT INTERVENTION (PCI-S);  Surgeon: Sinclair Grooms, MD;  Location: [facility] CATH LAB;  Service: Cardiovascular;;  . Inguinal hernia repair Bilateral 1966?  [name] Back surgery    . Lumbar disc surgery  1980's X 2    "ruptured disc"  . Cardiac catheterization  01/2014    Dr. Tamala Julian  . Coronary angioplasty with stent placement  04/04/2014    "2"  . Cardiac catheterization  04/04/2014    Procedure: CORONARY/BYPASS GRAFT CTO INTERVENTION;  Surgeon: Peter M Martinique, MD;  Location: [facility] CATH LAB;  Service: Cardiovascular;;     Current Outpatient Prescriptions  Medication Sig Dispense Refill  . amLODipine (NORVASC) 5 MG tablet Take 1 tablet (5 mg total) by mouth daily. 30 tablet 11  . aspirin EC 81 MG tablet Take 1 tablet (81 mg total) by mouth daily. 90 tablet  3  . atorvastatin (LIPITOR) 40 MG tablet Take 40 mg by mouth daily.     . clopidogrel (PLAVIX) 75 MG tablet Take 1 tablet (75 mg total) by mouth daily. 90 tablet 3  . metoprolol succinate (TOPROL-XL) 25 MG 24 hr tablet Take 1 tablet (25 mg total) by mouth daily. Take with or immediately following a meal.    . NITROSTAT 0.4 MG SL tablet Place 0.4 mg under the tongue every 5 (five) minutes as needed for chest pain.   0  . pantoprazole (PROTONIX) 40 MG tablet Take 1 tablet (40 mg total) by mouth daily. 30 tablet 11   No current facility-administered medications for this visit.    Allergies:   Review of patient's allergies indicates no known allergies.    Social History:  The patient  reports that he has been smoking Cigars.  He has never used smokeless tobacco. He reports that he does not drink alcohol or use illicit drugs.   Family History:  The patient's family history includes Cancer in his brother; Emphysema in his father; Heart disease in his brother and mother.    ROS:  Please see the history of present illness.   Otherwise, review of systems are positive for none.   All other systems are reviewed and negative.    PHYSICAL EXAM: VS:  BP 158/86 mmHg  Pulse 56  Ht 5\' 10"  (1.778 m)  Wt 208 lb (  94.348 kg)  BMI 29.84 kg/m2 , BMI Body mass index is 29.84 kg/(m^2). GEN: Well nourished, well developed, in no acute distress HEENT: normal Neck: no JVD, carotid bruits, or masses Cardiac: RRR; no murmurs, rubs, or gallops,no edema  Respiratory:  clear to auscultation bilaterally, normal work of breathing GI: soft, nontender, nondistended, + BS MS: no deformity or atrophy Skin: warm and dry, no rash Neuro:  Strength and sensation are intact Psych: euthymic mood, full affect   EKG:  EKG is not ordered today. The ekg ordered today demonstrates    Recent Labs: 04/05/2014: BUN 13; Creatinine 0.95; Hemoglobin 13.9; Platelets 133*; Potassium 4.2; Sodium 136    Lipid Panel No results  found for: CHOL, TRIG, HDL, CHOLHDL, VLDL, LDLCALC, LDLDIRECT    Wt Readings from Last 3 Encounters:  05/08/14 208 lb (94.348 kg)  04/06/14 203 lb (92.08 kg)  04/02/14 207 lb 8 oz (94.121 kg)      Other studies Reviewed: Additional studies/ records that were reviewed today include: . Review of the above records demonstrates:    ASSESSMENT AND PLAN:  1. CAD with recent CTO of LAD 2. Blood pressure is poorly controlled. 3. Hyperlipidemia   Current medicines are reviewed at length with the patient today.  The patient has concerns regarding medicines.  The following changes have been made:  Low salt diet and aerobic activity.  Labs/ tests ordered today include: None  No orders of the defined types were placed in this encounter.     Disposition:   FU with Linard Millers in 3 months   Signed, Sinclair Grooms, MD  05/08/2014 12:55 PM    [address], [place], [place]  [address] Phone: [phone]; Fax: [phone]

## 2014-05-24 ENCOUNTER — Ambulatory Visit (HOSPITAL_COMMUNITY): Payer: PPO

## 2014-09-07 ENCOUNTER — Telehealth: Payer: Self-pay

## 2014-09-07 ENCOUNTER — Ambulatory Visit (INDEPENDENT_AMBULATORY_CARE_PROVIDER_SITE_OTHER): Payer: PPO | Admitting: Interventional Cardiology

## 2014-09-07 ENCOUNTER — Encounter: Payer: Self-pay | Admitting: Interventional Cardiology

## 2014-09-07 VITALS — BP 164/80 | HR 50 | Ht 71.0 in | Wt 208.2 lb

## 2014-09-07 DIAGNOSIS — E782 Mixed hyperlipidemia: Secondary | ICD-10-CM | POA: Insufficient documentation

## 2014-09-07 DIAGNOSIS — I209 Angina pectoris, unspecified: Secondary | ICD-10-CM

## 2014-09-07 DIAGNOSIS — I25118 Atherosclerotic heart disease of native coronary artery with other forms of angina pectoris: Secondary | ICD-10-CM | POA: Diagnosis not present

## 2014-09-07 DIAGNOSIS — I444 Left anterior fascicular block: Secondary | ICD-10-CM | POA: Diagnosis not present

## 2014-09-07 DIAGNOSIS — I10 Essential (primary) hypertension: Secondary | ICD-10-CM

## 2014-09-07 DIAGNOSIS — E785 Hyperlipidemia, unspecified: Secondary | ICD-10-CM

## 2014-09-07 MED ORDER — TRIAMTERENE-HCTZ 37.5-25 MG PO TABS: 1.0000 | ORAL_TABLET | Freq: Every day | ORAL | Status: DC

## 2014-09-07 NOTE — Progress Notes (Signed)
Cardiology Office Note   Date:  09/07/2014   ID:  [name], DOB 1941-01-13, MRN [id number]  PCP:  Elba Barman, MD  Cardiologist:  Sinclair Grooms, MD   Chief Complaint  Patient presents with  . Coronary Artery Disease      History of Present Illness: Steven Yates is a 74 y.o. male who presents for CAD, successful LAD CTO 2015 PCI, central hypertension, hyperlipidemia, left anterior hemiblock.  He complains of exertional dyspnea. This is been present for approximately 3 years. He wonders why it is not improved. Chest discomfort he was having prior to CTO PCI has completely resolved. He stopped taking amlodipine because of lower extremity swelling. He states that he feels well other than dyspnea on exertion.    Past Medical History  Diagnosis Date  . 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 Gastroesophageal reflux 01/19/2014  . HTN (hypertension)   . Hypercholesterolemia   . Coronary artery disease     Past Surgical History  Procedure Laterality Date  . Shoulder open rotator cuff repair Right 1990's  . Cholecystectomy  2010  . Left heart catheterization with coronary angiogram N/A 01/30/2014    Procedure: LEFT HEART CATHETERIZATION WITH CORONARY ANGIOGRAM;  Surgeon: Sinclair Grooms, MD;  Location: [facility] CATH LAB;  Service: Cardiovascular;  Laterality: N/A;  . Percutaneous coronary stent intervention (pci-s)  01/30/2014    Procedure: PERCUTANEOUS CORONARY STENT INTERVENTION (PCI-S);  Surgeon: Sinclair Grooms, MD;  Location: [facility] CATH LAB;  Service: Cardiovascular;;  . Inguinal hernia repair Bilateral 1966?  [name] Back surgery    . Lumbar disc surgery  1980's X 2    "ruptured disc"  . Cardiac catheterization  01/2014    Dr. Tamala Julian  . Coronary angioplasty with stent placement  04/04/2014    "2"  . Cardiac catheterization  04/04/2014    Procedure: CORONARY/BYPASS GRAFT CTO INTERVENTION;  Surgeon: Peter M Martinique,  MD;  Location: [facility] CATH LAB;  Service: Cardiovascular;;     Current Outpatient Prescriptions  Medication Sig Dispense Refill  . aspirin EC 81 MG tablet Take 1 tablet (81 mg total) by mouth daily. 90 tablet 3  . atorvastatin (LIPITOR) 40 MG tablet Take 40 mg by mouth daily.     . clopidogrel (PLAVIX) 75 MG tablet Take 1 tablet (75 mg total) by mouth daily. 90 tablet 3  . metoprolol succinate (TOPROL-XL) 25 MG 24 hr tablet Take 1 tablet (25 mg total) by mouth daily. Take with or immediately following a meal.    . NITROSTAT 0.4 MG SL tablet Place 0.4 mg under the tongue every 5 (five) minutes as needed for chest pain.   0  . pantoprazole (PROTONIX) 40 MG tablet Take 1 tablet (40 mg total) by mouth daily. 30 tablet 11  . triamterene-hydrochlorothiazide (MAXZIDE-25) 37.5-25 MG per tablet Take 1 tablet by mouth daily. 30 tablet 11   No current facility-administered medications for this visit.    Allergies:   Review of patient's allergies indicates no known allergies.    Social History:  The patient  reports that he has been smoking Cigars.  He has never used smokeless tobacco. He reports that he does not drink alcohol or use illicit drugs.   Family History:  The patient's family history includes Cancer in his brother; Emphysema in his father; Heart disease in his brother and mother; Other in his sister.    ROS:  Please  see the history of present illness.   Otherwise, review of systems are positive for lower extremity edema resolved off amlodipine..   All other systems are reviewed and negative.    PHYSICAL EXAM: VS:  BP 164/80 mmHg  Pulse 50  Ht 5\' 11"  (1.803 m)  Wt 94.439 kg (208 lb 3.2 oz)  BMI 29.05 kg/m2  SpO2 96% , BMI Body mass index is 29.05 kg/(m^2). GEN: Well nourished, well developed, in no acute distress HEENT: normal Neck: no JVD, carotid bruits, or masses Cardiac: RRR; no murmurs, rubs, or gallops,no edema  Respiratory:  clear to auscultation bilaterally, normal work of  breathing GI: soft, nontender, nondistended, + BS MS: no deformity or atrophy Skin: warm and dry, no rash Neuro:  Strength and sensation are intact Psych: euthymic mood, full affect   EKG:  EKG is not ordered today.    Recent Labs: 04/05/2014: BUN 13; Creatinine, Ser 0.95; Hemoglobin 13.9; Platelets 133*; Potassium 4.2; Sodium 136    Lipid Panel No results found for: CHOL, TRIG, HDL, CHOLHDL, VLDL, LDLCALC, LDLDIRECT    Wt Readings from Last 3 Encounters:  09/07/14 94.439 kg (208 lb 3.2 oz)  05/08/14 94.348 kg (208 lb)  04/06/14 92.08 kg (203 lb)      Other studies Reviewed: Additional studies/ records that were reviewed today include: . Review of the above records demonstrates: No new data   ASSESSMENT AND PLAN:  Coronary artery disease involving native coronary artery with other forms of angina pectoris -denies angina post CTO therapy of chronically occluded LAD  Hyperlipidemia, on atorvastatin therapy.  Left anterior fascicular hemiblock  Essential hypertension- poorly controlled blood pressure. He has discontinued amlodipine because of lower extremity swelling     Current medicines are reviewed at length with the patient today.  The patient does not have concerns regarding medicines.  The following changes have been made:  Elevated blood pressure requires that we start Maxide 25 mg per day. He will need to have a basic metabolic panel in 2 weeks.  Labs/ tests ordered today include:   Orders Placed This Encounter  Procedures  . Basic metabolic panel     Disposition:   FU with HS in 6 months  Signed, Sinclair Grooms, MD  09/07/2014 1:33 PM    [facility] [place], [place], [place]  [address] Phone: [phone]; Fax: [phone]

## 2014-09-07 NOTE — Patient Instructions (Signed)
Medication Instructions:  Your physician has recommended you make the following change in your medication:  ) START Maxzide 37.5-25mg  daily. An Rx has been sent to your pharmacy  Labwork: Your physician recommends that you return for lab work in: 2 weeks  Testing/Procedures: None   Follow-Up: Your physician recommends that you schedule a follow-up appointment in: 2 weeks for a Nurse BP check  Your physician wants you to follow-up in: 6 months with Dr.Smith You will receive a reminder letter in the mail two months in advance. If you don't receive a letter, please call our office to schedule the follow-up appointment.    Any Other Special Instructions Will Be Listed Below (If Applicable).

## 2014-09-07 NOTE — Telephone Encounter (Signed)
Okay to use Viagra 50 mg  Cannot use NTG within 24 hours of use.

## 2014-09-07 NOTE — Telephone Encounter (Signed)
Pt was seen the office today. He is requesting a prescription for PDE5 inhibitor,pt rqst fwd to Dr.Smith to advise

## 2014-09-24 ENCOUNTER — Other Ambulatory Visit: Payer: Self-pay

## 2014-09-25 MED ORDER — SILDENAFIL CITRATE 50 MG PO TABS: 50.0000 mg | ORAL_TABLET | Freq: Every day | ORAL | Status: DC | PRN

## 2014-09-25 NOTE — Telephone Encounter (Signed)
Pt picked up written Rx for Viagra

## 2014-09-25 NOTE — Addendum Note (Signed)
Addended by: Lamar Laundry on: 09/25/2014 02:06 PM   Modules accepted: Orders

## 2014-09-25 NOTE — Addendum Note (Signed)
Addended by: Lamar Laundry on: 09/25/2014 02:08 PM   Modules accepted: Orders

## 2014-09-26 ENCOUNTER — Ambulatory Visit (INDEPENDENT_AMBULATORY_CARE_PROVIDER_SITE_OTHER): Payer: PPO | Admitting: *Deleted

## 2014-09-26 ENCOUNTER — Other Ambulatory Visit (INDEPENDENT_AMBULATORY_CARE_PROVIDER_SITE_OTHER): Payer: PPO | Admitting: *Deleted

## 2014-09-26 VITALS — BP 173/87 | HR 55 | Ht 70.0 in | Wt 204.8 lb

## 2014-09-26 DIAGNOSIS — I25118 Atherosclerotic heart disease of native coronary artery with other forms of angina pectoris: Secondary | ICD-10-CM | POA: Diagnosis not present

## 2014-09-26 DIAGNOSIS — I10 Essential (primary) hypertension: Secondary | ICD-10-CM | POA: Diagnosis not present

## 2014-09-26 LAB — BASIC METABOLIC PANEL
BUN: 14 mg/dL (ref 6–23)
CO2: 26 mEq/L (ref 19–32)
Calcium: 9.5 mg/dL (ref 8.4–10.5)
Chloride: 99 mEq/L (ref 96–112)
Creatinine, Ser: 0.96 mg/dL (ref 0.40–1.50)
GFR: 81.79 mL/min (ref >60.00)
GLUCOSE: 432 mg/dL — AB (ref 70–99)
Potassium: 4.4 mEq/L (ref 3.5–5.1)
Sodium: 133 mEq/L — ABNORMAL LOW (ref 135–145)

## 2014-09-26 MED ORDER — LISINOPRIL 5 MG PO TABS: 5.0000 mg | ORAL_TABLET | Freq: Every day | ORAL | Status: DC

## 2014-09-26 NOTE — Addendum Note (Signed)
Addended by: Lynann Bologna on: 09/26/2014 01:37 PM   Modules accepted: Level of Service

## 2014-09-26 NOTE — Progress Notes (Signed)
Left him know I agree with the recommendation and if he continues to have high blood pressure we will need to further increase the dose of lisinopril. The blood work will help [place] decide if he can tolerate further dose increases.

## 2014-09-26 NOTE — Addendum Note (Signed)
Addended by: Eulis Foster on: 09/26/2014 12:07 PM   Modules accepted: Orders

## 2014-09-26 NOTE — Progress Notes (Signed)
1.) Reason for visit: B/P check. B/P now BP  is 173/87 5 minutes later 160/74. Pt did not take the medication Maxzide -25 this AM, because it makes him to void frequently, and he has to be here in the office with his wife for an U/S.  2.) Name of MD requesting visit: Dr Daneen Schick MD  3.) H&P: CAD,  Central hypertension.  4.) ROS related to problem:  Pt's BP was 164/80 on O/V 09/07/14 AB/P medication Maxzide 37.5-25 mg daily was added the same day.  Assessment and plan per MD: Pt states that on 09/20/14 he took  his medication at 0700 AM and checked his BP in a drug store at noon same day . His BP was 172/79 10 minutes later 153/80.       Dr. Mare Ferrari DOD aware and recommends for pt to start Lisinopril 5 mg once a day, have BMET blood work in a week. Pt is aware.

## 2014-09-26 NOTE — Patient Instructions (Signed)
Pt is aware to start Lisinopril 5 mg once  Day in addition to Maxzide -25 /37.5-25 mg per tablet.  Have BMET labs in one week. Pt has an appointment for labs on Thursday 7 th 2016. Pt is aware.

## 2014-10-04 ENCOUNTER — Other Ambulatory Visit (INDEPENDENT_AMBULATORY_CARE_PROVIDER_SITE_OTHER): Payer: PPO | Admitting: *Deleted

## 2014-10-04 DIAGNOSIS — I10 Essential (primary) hypertension: Secondary | ICD-10-CM

## 2014-10-04 LAB — BASIC METABOLIC PANEL
BUN: 32 mg/dL — AB (ref 6–23)
CHLORIDE: 94 meq/L — AB (ref 96–112)
CO2: 26 meq/L (ref 19–32)
Calcium: 10.3 mg/dL (ref 8.4–10.5)
Creatinine, Ser: 1.51 mg/dL — ABNORMAL HIGH (ref 0.40–1.50)
GFR: 48.5 mL/min — ABNORMAL LOW (ref >60.00)
Glucose, Bld: 359 mg/dL — ABNORMAL HIGH (ref 70–99)
POTASSIUM: 4.4 meq/L (ref 3.5–5.1)
SODIUM: 130 meq/L — AB (ref 135–145)

## 2014-11-21 ENCOUNTER — Telehealth: Payer: Self-pay

## 2014-11-21 DIAGNOSIS — I10 Essential (primary) hypertension: Secondary | ICD-10-CM

## 2014-11-21 MED ORDER — TRIAMTERENE-HCTZ 37.5-25 MG PO TABS: 0.5000 | ORAL_TABLET | Freq: Every day | ORAL | Status: DC

## 2014-11-21 NOTE — Telephone Encounter (Signed)
Pt aware of lab results and Dr.Smith's recommendation Just getting this result. He needs to decrease the Maxzide to 1/2 tab daily. He then needs BMET 1 week later. Lab appt scheduled for 9/2. Pt verbalized understanding

## 2014-11-21 NOTE — Telephone Encounter (Signed)
-----   Message from Lynann Bologna, RN sent at 11/20/2014  4:03 PM EDT -----   ----- Message -----    From: Darlin Coco, MD    Sent: 10/04/2014   2:48 PM      To: Lynann Bologna, RN  This appears to be Dr. Thompson Caul patient.

## 2014-11-30 ENCOUNTER — Other Ambulatory Visit (INDEPENDENT_AMBULATORY_CARE_PROVIDER_SITE_OTHER): Payer: PPO

## 2014-11-30 DIAGNOSIS — I10 Essential (primary) hypertension: Secondary | ICD-10-CM | POA: Diagnosis not present

## 2014-11-30 LAB — BASIC METABOLIC PANEL
BUN: 16 mg/dL (ref 6–23)
CO2: 28 mEq/L (ref 19–32)
Calcium: 9.4 mg/dL (ref 8.4–10.5)
Chloride: 106 mEq/L (ref 96–112)
Creatinine, Ser: 1.07 mg/dL (ref 0.40–1.50)
GFR: 72.13 mL/min (ref >60.00)
Glucose, Bld: 111 mg/dL — ABNORMAL HIGH (ref 70–99)
POTASSIUM: 4 meq/L (ref 3.5–5.1)
Sodium: 142 mEq/L (ref 135–145)

## 2014-12-06 ENCOUNTER — Telehealth: Payer: Self-pay

## 2014-12-06 NOTE — Telephone Encounter (Signed)
-----   Message from Belva Crome, MD sent at 12/04/2014  5:50 PM EDT ----- Sodium and potassium are normal on low-dose diuretic

## 2014-12-06 NOTE — Telephone Encounter (Signed)
Pt aware of lab results Sodium and potassium are normal on low-dose diuretic Pt verbalized understanding.

## 2015-03-18 ENCOUNTER — Telehealth: Payer: Self-pay | Admitting: Interventional Cardiology

## 2015-03-18 NOTE — Telephone Encounter (Signed)
error 

## 2015-03-27 ENCOUNTER — Other Ambulatory Visit: Payer: Self-pay | Admitting: Cardiology

## 2015-03-27 NOTE — Telephone Encounter (Signed)
Rx request sent to pharmacy.  

## 2015-04-12 DIAGNOSIS — R195 Other fecal abnormalities: Secondary | ICD-10-CM | POA: Diagnosis not present

## 2015-04-12 DIAGNOSIS — K921 Melena: Secondary | ICD-10-CM | POA: Diagnosis not present

## 2015-04-12 DIAGNOSIS — M109 Gout, unspecified: Secondary | ICD-10-CM | POA: Diagnosis not present

## 2015-04-12 DIAGNOSIS — M25521 Pain in right elbow: Secondary | ICD-10-CM | POA: Diagnosis not present

## 2015-04-12 DIAGNOSIS — Z86010 Personal history of colonic polyps: Secondary | ICD-10-CM | POA: Diagnosis not present

## 2015-04-18 DIAGNOSIS — Z85828 Personal history of other malignant neoplasm of skin: Secondary | ICD-10-CM | POA: Diagnosis not present

## 2015-04-18 DIAGNOSIS — D225 Melanocytic nevi of trunk: Secondary | ICD-10-CM | POA: Diagnosis not present

## 2015-04-18 DIAGNOSIS — L821 Other seborrheic keratosis: Secondary | ICD-10-CM | POA: Diagnosis not present

## 2015-04-18 DIAGNOSIS — D485 Neoplasm of uncertain behavior of skin: Secondary | ICD-10-CM | POA: Diagnosis not present

## 2015-04-18 DIAGNOSIS — D1801 Hemangioma of skin and subcutaneous tissue: Secondary | ICD-10-CM | POA: Diagnosis not present

## 2015-04-18 DIAGNOSIS — C44321 Squamous cell carcinoma of skin of nose: Secondary | ICD-10-CM | POA: Diagnosis not present

## 2015-04-18 DIAGNOSIS — L820 Inflamed seborrheic keratosis: Secondary | ICD-10-CM | POA: Diagnosis not present

## 2015-04-29 DIAGNOSIS — E119 Type 2 diabetes mellitus without complications: Secondary | ICD-10-CM | POA: Diagnosis not present

## 2015-04-29 DIAGNOSIS — I2510 Atherosclerotic heart disease of native coronary artery without angina pectoris: Secondary | ICD-10-CM | POA: Diagnosis not present

## 2015-04-29 DIAGNOSIS — M109 Gout, unspecified: Secondary | ICD-10-CM | POA: Diagnosis not present

## 2015-04-29 DIAGNOSIS — I10 Essential (primary) hypertension: Secondary | ICD-10-CM | POA: Diagnosis not present

## 2015-05-13 ENCOUNTER — Encounter: Payer: Self-pay | Admitting: Interventional Cardiology

## 2015-05-23 ENCOUNTER — Encounter: Payer: Self-pay | Admitting: Interventional Cardiology

## 2015-05-23 ENCOUNTER — Ambulatory Visit (INDEPENDENT_AMBULATORY_CARE_PROVIDER_SITE_OTHER): Payer: PPO | Admitting: Interventional Cardiology

## 2015-05-23 VITALS — BP 162/84 | HR 51 | Ht 70.5 in | Wt 205.8 lb

## 2015-05-23 DIAGNOSIS — I10 Essential (primary) hypertension: Secondary | ICD-10-CM

## 2015-05-23 DIAGNOSIS — R001 Bradycardia, unspecified: Secondary | ICD-10-CM

## 2015-05-23 DIAGNOSIS — E785 Hyperlipidemia, unspecified: Secondary | ICD-10-CM

## 2015-05-23 DIAGNOSIS — I25118 Atherosclerotic heart disease of native coronary artery with other forms of angina pectoris: Secondary | ICD-10-CM | POA: Diagnosis not present

## 2015-05-23 MED ORDER — ATORVASTATIN CALCIUM 40 MG PO TABS: 40.0000 mg | ORAL_TABLET | Freq: Every day | ORAL | Status: DC

## 2015-05-23 MED ORDER — LOSARTAN POTASSIUM-HCTZ 50-12.5 MG PO TABS: 1.0000 | ORAL_TABLET | Freq: Every day | ORAL | Status: DC

## 2015-05-23 NOTE — Patient Instructions (Signed)
Medication Instructions:  Your physician has recommended you make the following change in your medication:  1) STOP Plavix 2) STOP Lisinopril 3) STOP Triamterene HCTZ 4) STOP Metoprolol  4) RESUME Atorvastatin 40mg  daily. An Rx has been sent to your pharnacy 5) START Hyzaar 50-12.5mg  daily. An Rx has been sent to your pharmacy   Labwork: Your physician recommends that you return for lab work in: 1 week Artist)  Your physician recommends that you return for a FASTING lipid profile and lft in 8 weeks    Testing/Procedures: None ordered  Follow-Up: Your physician wants you to follow-up in: 6 months  receive a reminder letter in the mail two months in advance. If you don't receive a letter, please call our office to schedule the follow-up appointment.   Any Other Special Instructions Will Be Listed Below (If Applicable). Please bring your medication bottles with you to each visit     If you need a refill on your cardiac medications before your next appointment, please call your pharmacy.

## 2015-05-23 NOTE — Progress Notes (Signed)
Cardiology Office Note   Date:  05/23/2015   ID:  [name], DOB [birth year], MRN [id number]  PCP:  [name], MD  Cardiologist:  Sinclair Grooms, MD   Chief Complaint  Patient presents with  . Coronary Artery Disease      History of Present Illness: Steven Yates is a 75 y.o. male who presents for  CAD, untreated hypertension, hyperlipidemia, and multiple medication intolerances. Also has erectile dysfunction.   No cardiopulmonary complaints. He says  Dr.Kim recently started him on 12 at half milligrams of losartan per day.  No side effects to this medication. He has not had angina. And gone over his medication list, he is not taking atorvastatin , discontinue triamterene and HCTZ , and never took lisinopril. Occasional headache. No angina. No dyspnea.    Past Medical History  Diagnosis Date  . 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 Gastroesophageal reflux 01/19/2014  . HTN (hypertension)   . Hypercholesterolemia   . Coronary artery disease     Past Surgical History  Procedure Laterality Date  . Shoulder open rotator cuff repair Right 1990's  . Cholecystectomy  2010  . Left heart catheterization with coronary angiogram N/A 01/30/2014    Procedure: LEFT HEART CATHETERIZATION WITH CORONARY ANGIOGRAM;  Surgeon: Sinclair Grooms, MD;  Location: [facility] CATH LAB;  Service: Cardiovascular;  Laterality: N/A;  . Percutaneous coronary stent intervention (pci-s)  01/30/2014    Procedure: PERCUTANEOUS CORONARY STENT INTERVENTION (PCI-S);  Surgeon: Sinclair Grooms, MD;  Location: [facility] CATH LAB;  Service: Cardiovascular;;  . Inguinal hernia repair Bilateral 1966?  [name] Back surgery    . Lumbar disc surgery  1980's X 2    "ruptured disc"  . Cardiac catheterization  01/2014    Dr. Tamala Julian  . Coronary angioplasty with stent placement  04/04/2014    "2"  . Cardiac catheterization  04/04/2014    Procedure: CORONARY/BYPASS GRAFT CTO  INTERVENTION;  Surgeon: Peter M Martinique, MD;  Location: [facility] CATH LAB;  Service: Cardiovascular;;     Current Outpatient Prescriptions  Medication Sig Dispense Refill  . aspirin EC 81 MG tablet Take 1 tablet (81 mg total) by mouth daily. 90 tablet 3  . atorvastatin (LIPITOR) 40 MG tablet Take 40 mg by mouth daily.     . clopidogrel (PLAVIX) 75 MG tablet TAKE 1 TABLET BY MOUTH EVERY DAY 90 tablet 0  . colchicine 0.6 MG tablet Take 0.6 mg by mouth daily.    [name] lisinopril (PRINIVIL,ZESTRIL) 5 MG tablet Take 1 tablet (5 mg total) by mouth daily. 30 tablet 5  . losartan (COZAAR) 25 MG tablet Take 25 mg by mouth daily.    . metFORMIN (GLUCOPHAGE) 500 MG tablet Take 500 mg by mouth daily.    . metoprolol succinate (TOPROL-XL) 25 MG 24 hr tablet Take 1 tablet (25 mg total) by mouth daily. Take with or immediately following a meal.    . NITROSTAT 0.4 MG SL tablet Place 0.4 mg under the tongue every 5 (five) minutes as needed for chest pain.   0  . pantoprazole (PROTONIX) 40 MG tablet Take 1 tablet (40 mg total) by mouth daily. 30 tablet 11  . triamterene-hydrochlorothiazide (MAXZIDE-25) 37.5-25 MG per tablet Take 0.5 tablets by mouth daily.     No current facility-administered medications for this visit.    Allergies:   Review of patient's allergies indicates no known allergies.  Social History:  The patient  reports that he has been smoking Cigars.  He has never used smokeless tobacco. He reports that he does not drink alcohol or use illicit drugs.   Family History:  The patient's family history includes Cancer in his brother; Emphysema in his father; Heart disease in his brother and mother; Other in his sister.    ROS:  Please see the history of present illness.   Otherwise, review of systems are positive for  Multiple medication intolerances as noted. Erectile dysfunction..   All other systems are reviewed and negative.    PHYSICAL EXAM: VS:  BP 162/84 mmHg  Pulse 51  Ht 5' 10.5" (1.791 m)   Wt 205 lb 12.8 oz (93.35 kg)  BMI 29.10 kg/m2 , BMI Body mass index is 29.1 kg/(m^2). GEN: Well nourished, well developed, in no acute distress HEENT: normal Neck: no JVD, carotid bruits, or masses Cardiac: RRR.  There is no murmur, rub, or gallop. There is no edema. Respiratory:  clear to auscultation bilaterally, normal work of breathing. GI: soft, nontender, nondistended, + BS MS: no deformity or atrophy Skin: warm and dry, no rash Neuro:  Strength and sensation are intact Psych: euthymic mood, full affect   EKG:  EKG is ordered today. The ekg reveals  Sinus bradycardia with leftward axis, QRS the one in V2, an ST-T wave abnormality.   Recent Labs: 11/30/2014: BUN 16; Creatinine, Ser 1.07; Potassium 4.0; Sodium 142    Lipid Panel No results found for: CHOL, TRIG, HDL, CHOLHDL, VLDL, LDLCALC, LDLDIRECT    Wt Readings from Last 3 Encounters:  05/23/15 205 lb 12.8 oz (93.35 kg)  09/26/14 204 lb 12 oz (92.874 kg)  09/07/14 208 lb 3.2 oz (94.439 kg)      Other studies Reviewed: Additional studies/ records that were reviewed today include:  Review laboratory data available in Epic.. The findings include  While on Maxide there was a slight bump in creatinine. Potassium remained normal. He only took a few days of therapy before he discontinued the medication. Perhaps he has a sulfur allergy..    ASSESSMENT AND PLAN:  1. Coronary artery disease involving native coronary artery with other forms of angina pectoris ([place])  asymptomatic. Now greater than one year out from stenting and will discontinue Plavix.  2. Essential hypertension  very poorly controlled especially on the systolic side where to recheck blood pressures were 180/90 mmHg  3. Hyperlipidemia  not being actively treated   4. Sinus bradycardia preventing optimal use of beta blocker therapy  Current medicines are reviewed at length with the patient today.  The patient has the following concerns regarding  medicines:  Poor medication compliance and or intolerance preventing adequate therapy.  The following changes/actions have been instituted:     discontinue Plavix   Discontinue metoprolol because of bradycardia   Discontinue  Losartan and losartan HCT 50/12.5 mg   Basic metabolic panel in 0000000 days   hepatic panel and liver panel in 8 weeks   Start atorvastatin 40  Milligrams daily  Labs/ tests ordered today include:  No orders of the defined types were placed in this encounter.     Disposition:   FU with HS in 6 months  Signed, Sinclair Grooms, MD  05/23/2015 10:17 AM    [facility], [place], [place]  [address] Phone: [phone]; Fax: [phone]

## 2015-05-24 ENCOUNTER — Ambulatory Visit: Payer: PPO | Admitting: Interventional Cardiology

## 2015-05-29 ENCOUNTER — Other Ambulatory Visit (INDEPENDENT_AMBULATORY_CARE_PROVIDER_SITE_OTHER): Payer: PPO | Admitting: *Deleted

## 2015-05-29 DIAGNOSIS — I10 Essential (primary) hypertension: Secondary | ICD-10-CM | POA: Diagnosis not present

## 2015-05-29 LAB — BASIC METABOLIC PANEL
BUN: 16 mg/dL (ref 7–25)
CALCIUM: 9.6 mg/dL (ref 8.6–10.3)
CO2: 28 mmol/L (ref 20–31)
Chloride: 98 mmol/L (ref 98–110)
Creat: 1.14 mg/dL (ref 0.70–1.18)
GLUCOSE: 158 mg/dL — AB (ref 65–99)
Potassium: 4.4 mmol/L (ref 3.5–5.3)
SODIUM: 137 mmol/L (ref 135–146)

## 2015-05-30 DIAGNOSIS — C44321 Squamous cell carcinoma of skin of nose: Secondary | ICD-10-CM | POA: Diagnosis not present

## 2015-06-07 ENCOUNTER — Telehealth: Payer: Self-pay | Admitting: Interventional Cardiology

## 2015-06-07 NOTE — Telephone Encounter (Signed)
Request for surgical clearance:  1. What type of surgery is being performed? Colonoscopy   2. When is this surgery scheduled? 06-14-15   3. Are there any medications that need to be held prior to surgery and how long? Plavix-3 days before and 2 days after   4. Name of physician performing surgery? Dr Lizbeth Bark   5. What is your office phone and fax number? [phone]-[phone] AttPamala Hurry  6.

## 2015-06-07 NOTE — Telephone Encounter (Signed)
Will forward to Dr. Smith for review and advisement 

## 2015-06-09 NOTE — Telephone Encounter (Signed)
Okay for colonoscopy and to hold Plavix 3-5 days before

## 2015-06-10 NOTE — Telephone Encounter (Signed)
Clearance placed in nurse fax box

## 2015-07-02 DIAGNOSIS — I10 Essential (primary) hypertension: Secondary | ICD-10-CM | POA: Diagnosis not present

## 2015-07-02 DIAGNOSIS — E119 Type 2 diabetes mellitus without complications: Secondary | ICD-10-CM | POA: Diagnosis not present

## 2015-07-02 DIAGNOSIS — E785 Hyperlipidemia, unspecified: Secondary | ICD-10-CM | POA: Diagnosis not present

## 2015-07-02 DIAGNOSIS — N529 Male erectile dysfunction, unspecified: Secondary | ICD-10-CM | POA: Diagnosis not present

## 2015-07-18 ENCOUNTER — Other Ambulatory Visit (INDEPENDENT_AMBULATORY_CARE_PROVIDER_SITE_OTHER): Payer: PPO | Admitting: *Deleted

## 2015-07-18 DIAGNOSIS — E785 Hyperlipidemia, unspecified: Secondary | ICD-10-CM | POA: Diagnosis not present

## 2015-07-18 LAB — LIPID PANEL
Cholesterol: 90 mg/dL — ABNORMAL LOW (ref 125–200)
HDL: 26 mg/dL — ABNORMAL LOW (ref >=40)
LDL CALC: 34 mg/dL (ref <130)
Total CHOL/HDL Ratio: 3.5 Ratio (ref <=5.0)
Triglycerides: 149 mg/dL (ref <150)
VLDL: 30 mg/dL (ref <30)

## 2015-07-18 LAB — HEPATIC FUNCTION PANEL
ALT: 45 U/L (ref 9–46)
AST: 24 U/L (ref 10–35)
Albumin: 4.3 g/dL (ref 3.6–5.1)
Alkaline Phosphatase: 47 U/L (ref 40–115)
BILIRUBIN INDIRECT: 0.7 mg/dL (ref 0.2–1.2)
Bilirubin, Direct: 0.2 mg/dL (ref <=0.2)
TOTAL PROTEIN: 6.6 g/dL (ref 6.1–8.1)
Total Bilirubin: 0.9 mg/dL (ref 0.2–1.2)

## 2015-07-18 NOTE — Addendum Note (Signed)
Addended by: Eulis Foster on: 07/18/2015 07:23 AM   Modules accepted: Orders

## 2015-07-23 ENCOUNTER — Telehealth: Payer: Self-pay

## 2015-07-23 DIAGNOSIS — E785 Hyperlipidemia, unspecified: Secondary | ICD-10-CM

## 2015-07-23 MED ORDER — ATORVASTATIN CALCIUM 10 MG PO TABS: 10.0000 mg | ORAL_TABLET | Freq: Every day | ORAL | Status: DC

## 2015-07-23 NOTE — Telephone Encounter (Signed)
Pt aware of lab results and Dr.Smith's recommendation. Current therapy is overtreating cholesterol. Decrease atorvastatin to 10 mg daily. Repeat liver and lipid panel in 6 months.  Rx sent to pt pharmacy Recall in epic for 6 mo fasting lab. Pt verbalized understanding.

## 2015-07-23 NOTE — Telephone Encounter (Signed)
-----   Message from Belva Crome, MD sent at 07/20/2015 11:13 AM EDT ----- Current therapy is overtreating cholesterol. Decrease atorvastatin to 10 mg daily. Repeat liver and lipid panel in 6 months.

## 2015-08-19 DIAGNOSIS — G5602 Carpal tunnel syndrome, left upper limb: Secondary | ICD-10-CM | POA: Diagnosis not present

## 2015-08-19 DIAGNOSIS — M79641 Pain in right hand: Secondary | ICD-10-CM | POA: Diagnosis not present

## 2015-08-19 DIAGNOSIS — M79642 Pain in left hand: Secondary | ICD-10-CM | POA: Diagnosis not present

## 2015-09-30 DIAGNOSIS — I10 Essential (primary) hypertension: Secondary | ICD-10-CM | POA: Diagnosis not present

## 2015-09-30 DIAGNOSIS — E119 Type 2 diabetes mellitus without complications: Secondary | ICD-10-CM | POA: Diagnosis not present

## 2015-09-30 DIAGNOSIS — M79642 Pain in left hand: Secondary | ICD-10-CM | POA: Diagnosis not present

## 2015-09-30 DIAGNOSIS — M65312 Trigger thumb, left thumb: Secondary | ICD-10-CM | POA: Diagnosis not present

## 2015-10-04 DIAGNOSIS — I2510 Atherosclerotic heart disease of native coronary artery without angina pectoris: Secondary | ICD-10-CM | POA: Diagnosis not present

## 2015-10-04 DIAGNOSIS — E119 Type 2 diabetes mellitus without complications: Secondary | ICD-10-CM | POA: Diagnosis not present

## 2015-10-04 DIAGNOSIS — E785 Hyperlipidemia, unspecified: Secondary | ICD-10-CM | POA: Diagnosis not present

## 2015-10-04 DIAGNOSIS — I10 Essential (primary) hypertension: Secondary | ICD-10-CM | POA: Diagnosis not present

## 2015-10-07 ENCOUNTER — Encounter: Payer: Self-pay | Admitting: Interventional Cardiology

## 2015-10-11 DIAGNOSIS — D123 Benign neoplasm of transverse colon: Secondary | ICD-10-CM | POA: Diagnosis not present

## 2015-10-11 DIAGNOSIS — D122 Benign neoplasm of ascending colon: Secondary | ICD-10-CM | POA: Diagnosis not present

## 2015-10-11 DIAGNOSIS — D124 Benign neoplasm of descending colon: Secondary | ICD-10-CM | POA: Diagnosis not present

## 2015-10-11 DIAGNOSIS — D126 Benign neoplasm of colon, unspecified: Secondary | ICD-10-CM | POA: Diagnosis not present

## 2015-10-11 DIAGNOSIS — Z86010 Personal history of colonic polyps: Secondary | ICD-10-CM | POA: Diagnosis not present

## 2015-11-11 DIAGNOSIS — M65312 Trigger thumb, left thumb: Secondary | ICD-10-CM | POA: Diagnosis not present

## 2015-11-28 DIAGNOSIS — M65312 Trigger thumb, left thumb: Secondary | ICD-10-CM | POA: Diagnosis not present

## 2015-12-05 ENCOUNTER — Ambulatory Visit (INDEPENDENT_AMBULATORY_CARE_PROVIDER_SITE_OTHER): Payer: PPO | Admitting: Interventional Cardiology

## 2015-12-05 ENCOUNTER — Encounter: Payer: Self-pay | Admitting: Interventional Cardiology

## 2015-12-05 ENCOUNTER — Other Ambulatory Visit: Payer: Self-pay | Admitting: *Deleted

## 2015-12-05 VITALS — BP 162/90 | HR 54 | Ht 70.0 in | Wt 209.4 lb

## 2015-12-05 DIAGNOSIS — I2510 Atherosclerotic heart disease of native coronary artery without angina pectoris: Secondary | ICD-10-CM | POA: Diagnosis not present

## 2015-12-05 DIAGNOSIS — I444 Left anterior fascicular block: Secondary | ICD-10-CM | POA: Diagnosis not present

## 2015-12-05 DIAGNOSIS — I10 Essential (primary) hypertension: Secondary | ICD-10-CM

## 2015-12-05 DIAGNOSIS — E785 Hyperlipidemia, unspecified: Secondary | ICD-10-CM | POA: Diagnosis not present

## 2015-12-05 MED ORDER — LOSARTAN POTASSIUM-HCTZ 50-12.5 MG PO TABS: 1.0000 | ORAL_TABLET | Freq: Every day | ORAL | 3 refills | Status: DC

## 2015-12-05 NOTE — Patient Instructions (Signed)
Please call the office later today to review home medications.  At that time we will determine if any medicine changes are needed.  Your physician recommends that you schedule a follow-up appointment in: 1 months with APP  Your physician recommends that you return for lab work in: 1 month (BMET)

## 2015-12-05 NOTE — Progress Notes (Signed)
Cardiology Office Note    Date:  12/05/2015   ID:  ELBURN Yates, DOB 25-Jun-1940, MRN [id number]  PCP:  [name], MD  Cardiologist: Sinclair Grooms, MD   Chief Complaint  Patient presents with  . Coronary Artery Disease    History of Present Illness:  Steven Yates is a 75 y.o. male who presents for CAD, untreated hypertension, hyperlipidemia, and multiple medication intolerances. Also has erectile dysfunction  He is doing okay. He denies dyspnea and chest pain. He is confused about his blood pressure medication regimen. He believes he is on losartan 25 mg per day. I records suggest that should be Hyzaar 50/12.5 mg daily.   Past Medical History:  Diagnosis Date  . Coronary artery disease   . Gastroesophageal reflux 01/19/2014  . HTN (hypertension)   . Hypercholesterolemia   . 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 BACK SURGERY    . CARDIAC CATHETERIZATION  01/2014   Dr. Tamala Julian  . CARDIAC CATHETERIZATION  04/04/2014   Procedure: CORONARY/BYPASS GRAFT CTO INTERVENTION;  Surgeon: Peter M Martinique, MD;  Location: [facility] CATH LAB;  Service: Cardiovascular;;  . CHOLECYSTECTOMY  2010  . CORONARY ANGIOPLASTY WITH STENT PLACEMENT  04/04/2014   "2"  . INGUINAL HERNIA REPAIR Bilateral 1966?  [name] LEFT HEART CATHETERIZATION WITH CORONARY ANGIOGRAM N/A 01/30/2014   Procedure: LEFT HEART CATHETERIZATION WITH CORONARY ANGIOGRAM;  Surgeon: Sinclair Grooms, MD;  Location: [facility] CATH LAB;  Service: Cardiovascular;  Laterality: N/A;  . LUMBAR [place] SURGERY  1980's X 2   "ruptured disc"  . PERCUTANEOUS CORONARY STENT INTERVENTION (PCI-S)  01/30/2014   Procedure: PERCUTANEOUS CORONARY STENT INTERVENTION (PCI-S);  Surgeon: Sinclair Grooms, MD;  Location: [facility] C[facility] LAB;  Service: Cardiovascular;;  . SHOULDER OPEN ROTATOR CUFF REPAIR Right 1990's    Current Medications: Outpatient Medications  Prior to Visit  Medication Sig Dispense Refill  . aspirin EC 81 MG tablet Take 1 tablet (81 mg total) by mouth daily. 90 tablet 3  . atorvastatin (LIPITOR) 10 MG tablet Take 1 tablet (10 mg total) by mouth daily. 30 tablet 11  . colchicine 0.6 MG tablet Take 0.6 mg by mouth daily as needed (gout).     . metFORMIN (GLUCOPHAGE) 500 MG tablet Take 500 mg by mouth daily.    [name] NITROSTAT 0.4 MG SL tablet Place 0.4 mg under the tongue every 5 (five) minutes as needed for chest pain.   0  . pantoprazole (PROTONIX) 40 MG tablet Take 1 tablet (40 mg total) by mouth daily. 30 tablet 11  . losartan-hydrochlorothiazide (HYZAAR) 50-12.5 MG tablet Take 1 tablet by mouth daily. 30 tablet 11  . metoprolol succinate (TOPROL-XL) 25 MG 24 hr tablet Take 1 tablet (25 mg total) by mouth daily. Take with or immediately following a meal. (Patient not taking: Reported on 12/05/2015)     No facility-administered medications prior to visit.      Allergies:   Review of patient's allergies indicates no known allergies.   Social History   Social History  . Marital status: Married    Spouse name: N/A  . Number of children: 2  . 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 Smoking status: Current Every Day Smoker    Years: 35.00    Types: Cigars  .  Smokeless tobacco: Never Used  . Alcohol use No  . Drug use: No  . Sexual activity: Yes   Other Topics Concern  . None   Social History Narrative  . None     Family History:  The patient's family history includes Cancer in his brother; Emphysema in his father; Heart disease in his brother and mother; Other in his sister.   ROS:   Please see the history of present illness.    Multiple medication intolerances. History of easy bruising  All other systems reviewed and are negative.   PHYSICAL EXAM:   VS:  BP (!) 162/90   Pulse (!) 54   Ht 5\' 10"  (1.778 m)   Wt 209 lb 6.4 oz (95 kg)   BMI 30.05  kg/m    GEN: Well nourished, well developed, in no acute distress  HEENT: normal  Neck: no JVD, carotid bruits, or masses Cardiac: RRR; no murmurs, rubs, or gallops,no edema  Respiratory:  clear to auscultation bilaterally, normal work of breathing GI: soft, nontender, nondistended, + BS MS: no deformity or atrophy  Skin: warm and dry, no rash Neuro:  Alert and Oriented x 3, Strength and sensation are intact Psych: euthymic mood, full affect  Wt Readings from Last 3 Encounters:  12/05/15 209 lb 6.4 oz (95 kg)  05/23/15 205 lb 12.8 oz (93.4 kg)  09/26/14 204 lb 12 oz (92.9 kg)      Studies/Labs Reviewed:   EKG:  EKG  Not performed.  Recent Labs: 05/29/2015: BUN 16; Creat 1.14; Potassium 4.4; Sodium 137 07/18/2015: ALT 45   Lipid Panel    Component Value Date/Time   CHOL 90 (L) 07/18/2015 0724   TRIG 149 07/18/2015 0724   HDL 26 (L) 07/18/2015 0724   CHOLHDL 3.5 07/18/2015 0724   VLDL 30 07/18/2015 0724   LDLCALC 34 07/18/2015 0724    Additional studies/ records that were reviewed today include:  No recent laboratory data.    ASSESSMENT:    1. Coronary artery disease involving native coronary artery of native heart without angina pectoris   2. Essential hypertension   3. Left anterior fascicular hemiblock   4. Hyperlipidemia      PLAN:  In order of problems listed above:  1. Asymptomatic with reference to angina. Recommended aerobic activity. 2. 2 g sodium diet. He will phone [place] with the exact name of the medication and the dose. We will then make changes based upon what he is actually taking. He will then be seen in one month by APP and have blood work done at that time. He's he is indeed on Hyzaar 50/12.5 mg we will increase to 100/12.5 mg. 3. Not addressed 4. Plant-based diet and statin therapy as listed. Last LDL was 34 in April he thousand 17.  This appointment was very lengthy based on the patient's misunderstanding of his current medical regimen. He has  been asked to call us from home with his medications [place] so that we can document what he takes exactly. I currently fear that we are making some mistakes in his management based upon his history of medication use.  Medication Adjustments/Labs and Tests Ordered: Current medicines are reviewed at length with the patient today.  Concerns regarding medicines are outlined above.  Medication changes, Labs and Tests ordered today are listed in the Patient Instructions below. There are no Patient Instructions on file for this visit.   Signed, Sinclair Grooms, MD  12/05/2015 9:07 AM  [facility] [place], [place], [place]  [address] Phone: [phone]; Fax: [phone]

## 2015-12-06 DIAGNOSIS — Z4789 Encounter for other orthopedic aftercare: Secondary | ICD-10-CM | POA: Diagnosis not present

## 2015-12-19 ENCOUNTER — Encounter: Payer: Self-pay | Admitting: Physician Assistant

## 2015-12-23 ENCOUNTER — Encounter: Payer: Self-pay | Admitting: Interventional Cardiology

## 2015-12-30 ENCOUNTER — Encounter: Payer: Self-pay | Admitting: Interventional Cardiology

## 2015-12-30 DIAGNOSIS — E119 Type 2 diabetes mellitus without complications: Secondary | ICD-10-CM | POA: Diagnosis not present

## 2015-12-30 DIAGNOSIS — R5383 Other fatigue: Secondary | ICD-10-CM | POA: Diagnosis not present

## 2015-12-30 DIAGNOSIS — I10 Essential (primary) hypertension: Secondary | ICD-10-CM | POA: Diagnosis not present

## 2016-01-02 NOTE — Progress Notes (Addendum)
Cardiology Office Note    Date:  01/06/2016   ID:  Steven Yates, DOB [birth year], MRN [id number]  PCP:  [name], MD  Cardiologist:  Dr. Tamala Julian  CC: 1 mo follow up  History of Present Illness:  Steven Yates is a 75 y.o. male with a history of ED, DMT2, CAD s/p CTO PCI to LAD with DESx2 (03/2014), untreated hypertension, HLD, LAFB and multiple medication intolerances who presents to clinic for follow up.   He saw Dr. Tamala Julian on 12/05/15 for follow up. There was some confusion about his BP regimen. His BP was elevated to 169/90. He was continued on Hyzaar 50-12.5mg  daily, but Losartan was increase from 25mg --> 50mg  daily.   He went to his medical doctor last week and his BP was well controlled. Also had a lot of blood work that was sent over here. Everything was good except HgA1c was a little elevated at 7.1  Today he presents to clinic for follow up. Everything has been good . BP looks much better today. No chest pain or SOB. No Le edema, orthopnea or PND. No dizziness or syncope. No blood in his stool or urine. No palpitations.    Past Medical History:  Diagnosis Date  . Coronary artery disease   . Gastroesophageal reflux 01/19/2014  . HTN (hypertension)   . Hypercholesterolemia   . 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 BACK SURGERY    . CARDIAC CATHETERIZATION  01/2014   Dr. Tamala Julian  . CARDIAC CATHETERIZATION  04/04/2014   Procedure: CORONARY/BYPASS GRAFT CTO INTERVENTION;  Surgeon: Peter M Martinique, MD;  Location: [facility] CATH LAB;  Service: Cardiovascular;;  . CHOLECYSTECTOMY  2010  . CORONARY ANGIOPLASTY WITH STENT PLACEMENT  04/04/2014   "2"  . INGUINAL HERNIA REPAIR Bilateral 1966?  [name] LEFT HEART CATHETERIZATION WITH CORONARY ANGIOGRAM N/A 01/30/2014   Procedure: LEFT HEART CATHETERIZATION WITH CORONARY ANGIOGRAM;  Surgeon: Sinclair Grooms, MD;  Location: [facility] CATH LAB;   Service: Cardiovascular;  Laterality: N/A;  . LUMBAR [place] SURGERY  1980's X 2   "ruptured disc"  . PERCUTANEOUS CORONARY STENT INTERVENTION (PCI-S)  01/30/2014   Procedure: PERCUTANEOUS CORONARY STENT INTERVENTION (PCI-S);  Surgeon: Sinclair Grooms, MD;  Location: [facility] CATH LAB;  Service: Cardiovascular;;  . SHOULDER OPEN ROTATOR CUFF REPAIR Right 1990's    Current Medications: Outpatient Medications Prior to Visit  Medication Sig Dispense Refill  . aspirin EC 81 MG tablet Take 1 tablet (81 mg total) by mouth daily. 90 tablet 3  . atorvastatin (LIPITOR) 10 MG tablet Take 1 tablet (10 mg total) by mouth daily. 30 tablet 11  . colchicine 0.6 MG tablet Take 0.6 mg by mouth daily as needed (gout).     [name] losartan-hydrochlorothiazide (HYZAAR) 50-12.5 MG tablet Take 1 tablet by mouth daily. 90 tablet 3  . metFORMIN (GLUCOPHAGE) 500 MG tablet Take 500 mg by mouth daily.    [name] NITROSTAT 0.4 MG SL tablet Place 0.4 mg under the tongue every 5 (five) minutes as needed for chest pain.   0  . pantoprazole (PROTONIX) 40 MG tablet Take 1 tablet (40 mg total) by mouth daily. 30 tablet 11   No facility-administered medications prior to visit.      Allergies:   Review of patient's allergies indicates no known allergies.   Social History   Social History  . Marital status: Married  Spouse name: N/A  . Number of children: 2  . 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 Smoking status: Current Every Day Smoker    Years: 35.00    Types: Cigars  . Smokeless tobacco: Never Used  . Alcohol use No  . Drug use: No  . Sexual activity: Yes   Other Topics Concern  . None   Social History Narrative  . None     Family History:  The patient's family history includes Cancer in his brother; Emphysema in his father; Heart disease in his brother and mother; Other in his sister.     ROS:   Please see the history of present illness.    ROS  All other systems reviewed and are negative.   PHYSICAL EXAM:   VS:  BP 140/80   Pulse 62   Ht 5\' 10"  (1.778 m)   Wt 205 lb 12.8 oz (93.4 kg)   BMI 29.53 kg/m    GEN: Well nourished, well developed, in no acute distress  HEENT: normal  Neck: no JVD, carotid bruits, or masses Cardiac: RRR; no murmurs, rubs, or gallops,no edema  Respiratory:  clear to auscultation bilaterally, normal work of breathing GI: soft, nontender, nondistended, + BS MS: no deformity or atrophy  Skin: warm and dry, no rash Neuro:  Alert and Oriented x 3, Strength and sensation are intact Psych: euthymic mood, full affect  Wt Readings from Last 3 Encounters:  01/06/16 205 lb 12.8 oz (93.4 kg)  12/05/15 209 lb 6.4 oz (95 kg)  05/23/15 205 lb 12.8 oz (93.4 kg)      Studies/Labs Reviewed:   EKG:  EKG is NOT ordered today.    Recent Labs: 05/29/2015: BUN 16; Creat 1.14; Potassium 4.4; Sodium 137 07/18/2015: ALT 45   Lipid Panel    Component Value Date/Time   CHOL 90 (L) 07/18/2015 0724   TRIG 149 07/18/2015 0724   HDL 26 (L) 07/18/2015 0724   CHOLHDL 3.5 07/18/2015 0724   VLDL 30 07/18/2015 0724   LDLCALC 34 07/18/2015 0724    Additional studies/ records that were reviewed today include:  Nuclear stress test 12/2013 Overall Impression:  The study is abnormal. This is a moderate risk scan. There is suggestion of scar and ischemia in the anterior wall near the apex. The study was reviewed immediately by Dr. Harrington Challenger in the office. Catheterization has been scheduled. These findings represent a definite change from the report of April, 2002. LV Ejection Fraction: 49%.  LV Wall Motion:  There were no definite focal wall motion abnormalities.   CATH  01/2014 IMPRESSIONS:  1. 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 Intermediate risk myocardial perfusion study is also noted. 2. Unsuccessful antegrade wiring of the LAD using conventional technique. 3.  Widely patent circumflex, ramus, and RCA. Circumflex and ramus supply collaterals to the LAD as does the right coronary. 4. Normal LV function  RECOMMENDATION:  Start Imdur 60 mg daily Start metoprolol Succinate50 mg daily Nitroglycerin if dyspnea occurs spontaneously and is similar to that experienced during physical activity Referred to the CTO Team for consideration of advanced recanalization therapy to alleviate this patient's symptoms.   CATH 03/2014 Conclusions: Successful CTO PCI of the LAD with DES x 2.  Recommendations: DAPT for at least one year. Anticipate DC in am if stable.     ASSESSMENT & PLAN:  Steven Yates is a 74 y.o. male with a history of ED, DMT2, CAD s/p CTO PCI to LAD with DESx2 (03/2014), untreated hypertension, HLD, LAFB and multiple medication intolerances who presents to clinic for follow up.   CAD: no angina. Continue ASA and statin. No BB due to bradycardia  HTN: BP with moderate on current regimen. His BP was 152/80 on my recheck. He thinks its because he is at the Dr. Gabriel Carina. He said it was much better controlled at recent PCP office last week. Labs were done there and reportedly okay. Will have him monitor BP at home and call us if over >140/90. For now he would not like to add more medications.   DMT2: continue Metformin.   HLD: continue statin  Medication Adjustments/Labs and Tests Ordered: Current medicines are reviewed at length with the patient today.  Concerns regarding medicines are outlined above.  Medication changes, Labs and Tests ordered today are listed in the Patient Instructions below. Patient Instructions  Medication Instructions:  Your physician recommends that you continue on your current medications as directed. Please refer to the Current Medication list given to you today.  1.  Monitor your blood pressure at home.  If it runs over 140/90 call our office [phone]  Labwork: None ordered  Testing/Procedures: None  ordered  Follow-Up: Your physician wants you to follow-up in: [place] will receive a reminder letter in the mail two months in advance. If you don't receive a letter, please call our office to schedule the follow-up appointment.    Any Other Special Instructions Will Be Listed Below (If Applicable).     If you need a refill on your cardiac medications before your next appointment, please call your pharmacy.      Signed, Angelena Form, PA-C  01/06/2016 8:22 AM    [facility] [place], [place], [place]  [address] Phone: [phone]; Fax: [phone]

## 2016-01-03 DIAGNOSIS — I2510 Atherosclerotic heart disease of native coronary artery without angina pectoris: Secondary | ICD-10-CM | POA: Diagnosis not present

## 2016-01-03 DIAGNOSIS — E785 Hyperlipidemia, unspecified: Secondary | ICD-10-CM | POA: Diagnosis not present

## 2016-01-03 DIAGNOSIS — I10 Essential (primary) hypertension: Secondary | ICD-10-CM | POA: Diagnosis not present

## 2016-01-03 DIAGNOSIS — E119 Type 2 diabetes mellitus without complications: Secondary | ICD-10-CM | POA: Diagnosis not present

## 2016-01-06 ENCOUNTER — Ambulatory Visit (INDEPENDENT_AMBULATORY_CARE_PROVIDER_SITE_OTHER): Payer: PPO | Admitting: Physician Assistant

## 2016-01-06 ENCOUNTER — Encounter: Payer: Self-pay | Admitting: Physician Assistant

## 2016-01-06 ENCOUNTER — Other Ambulatory Visit: Payer: PPO

## 2016-01-06 VITALS — BP 140/80 | HR 62 | Ht 70.0 in | Wt 205.8 lb

## 2016-01-06 DIAGNOSIS — E118 Type 2 diabetes mellitus with unspecified complications: Secondary | ICD-10-CM

## 2016-01-06 DIAGNOSIS — I10 Essential (primary) hypertension: Secondary | ICD-10-CM

## 2016-01-06 DIAGNOSIS — E785 Hyperlipidemia, unspecified: Secondary | ICD-10-CM | POA: Diagnosis not present

## 2016-01-06 DIAGNOSIS — I2510 Atherosclerotic heart disease of native coronary artery without angina pectoris: Secondary | ICD-10-CM | POA: Diagnosis not present

## 2016-01-06 NOTE — Patient Instructions (Addendum)
Medication Instructions:  Your physician recommends that you continue on your current medications as directed. Please refer to the Current Medication list given to you today.  1.  Monitor your blood pressure at home.  If it runs over 140/90 call our office [phone]  Labwork: None ordered  Testing/Procedures: None ordered  Follow-Up: Your physician wants you to follow-up in: [place] will receive a reminder letter in the mail two months in advance. If you don't receive a letter, please call our office to schedule the follow-up appointment.    Any Other Special Instructions Will Be Listed Below (If Applicable).     If you need a refill on your cardiac medications before your next appointment, please call your pharmacy.

## 2016-04-01 DIAGNOSIS — M25562 Pain in left knee: Secondary | ICD-10-CM | POA: Diagnosis not present

## 2016-04-01 DIAGNOSIS — E119 Type 2 diabetes mellitus without complications: Secondary | ICD-10-CM | POA: Diagnosis not present

## 2016-04-01 DIAGNOSIS — I10 Essential (primary) hypertension: Secondary | ICD-10-CM | POA: Diagnosis not present

## 2016-04-01 DIAGNOSIS — M109 Gout, unspecified: Secondary | ICD-10-CM | POA: Diagnosis not present

## 2016-04-03 DIAGNOSIS — M25462 Effusion, left knee: Secondary | ICD-10-CM | POA: Diagnosis not present

## 2016-04-03 DIAGNOSIS — M25562 Pain in left knee: Secondary | ICD-10-CM | POA: Diagnosis not present

## 2016-04-03 DIAGNOSIS — E790 Hyperuricemia without signs of inflammatory arthritis and tophaceous disease: Secondary | ICD-10-CM | POA: Diagnosis not present

## 2016-04-03 DIAGNOSIS — M109 Gout, unspecified: Secondary | ICD-10-CM | POA: Diagnosis not present

## 2016-04-10 DIAGNOSIS — E119 Type 2 diabetes mellitus without complications: Secondary | ICD-10-CM | POA: Diagnosis not present

## 2016-04-10 DIAGNOSIS — Z125 Encounter for screening for malignant neoplasm of prostate: Secondary | ICD-10-CM | POA: Diagnosis not present

## 2016-04-10 DIAGNOSIS — I10 Essential (primary) hypertension: Secondary | ICD-10-CM | POA: Diagnosis not present

## 2016-05-01 DIAGNOSIS — M25562 Pain in left knee: Secondary | ICD-10-CM | POA: Diagnosis not present

## 2016-05-01 DIAGNOSIS — M25561 Pain in right knee: Secondary | ICD-10-CM | POA: Diagnosis not present

## 2016-05-01 DIAGNOSIS — M179 Osteoarthritis of knee, unspecified: Secondary | ICD-10-CM | POA: Diagnosis not present

## 2016-05-01 DIAGNOSIS — Z79899 Other long term (current) drug therapy: Secondary | ICD-10-CM | POA: Diagnosis not present

## 2016-05-01 DIAGNOSIS — E790 Hyperuricemia without signs of inflammatory arthritis and tophaceous disease: Secondary | ICD-10-CM | POA: Diagnosis not present

## 2016-05-01 DIAGNOSIS — M109 Gout, unspecified: Secondary | ICD-10-CM | POA: Diagnosis not present

## 2016-06-19 ENCOUNTER — Other Ambulatory Visit: Payer: Self-pay | Admitting: Interventional Cardiology

## 2016-06-19 DIAGNOSIS — E785 Hyperlipidemia, unspecified: Secondary | ICD-10-CM

## 2016-08-13 NOTE — Progress Notes (Signed)
Cardiology Office Note    Date:  08/14/2016   ID:  Steven Yates, DOB [birth year], MRN [id number]  PCP:  [name], MD  Cardiologist: Sinclair Grooms, MD   Chief Complaint  Patient presents with  . Follow-up  . Shortness of Breath    History of Present Illness:  Steven Yates is a 76 y.o. male  with a history of ED, DMT2, CAD s/p CTO PCI to LAD with DESx2 (03/2014), untreated hypertension, HLD, LAFB and multiple medication intolerances who presents to clinic for follow up.  He has had exertional dyspnea progressive over the past month. This is very similar to pre PCI of the chronic total occlusion of the LAD. Mild to moderate activity causes significant breathlessness that resolves with rest. He is not short of breath at rest. There is no orthopnea PND. No episodes of chest pain.   Past Medical History:  Diagnosis Date  . Coronary artery disease   . Gastroesophageal reflux 01/19/2014  . HTN (hypertension)   . Hypercholesterolemia   . 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 BACK SURGERY    . CARDIAC CATHETERIZATION  01/2014   Dr. Tamala Julian  . CARDIAC CATHETERIZATION  04/04/2014   Procedure: CORONARY/BYPASS GRAFT CTO INTERVENTION;  Surgeon: Peter M Martinique, MD;  Location: [facility] CATH LAB;  Service: Cardiovascular;;  . CHOLECYSTECTOMY  2010  . CORONARY ANGIOPLASTY WITH STENT PLACEMENT  04/04/2014   "2"  . INGUINAL HERNIA REPAIR Bilateral 1966?  [name] LEFT HEART CATHETERIZATION WITH CORONARY ANGIOGRAM N/A 01/30/2014   Procedure: LEFT HEART CATHETERIZATION WITH CORONARY ANGIOGRAM;  Surgeon: Sinclair Grooms, MD;  Location: [facility] CATH LAB;  Service: Cardiovascular;  Laterality: N/A;  . LUMBAR [place] SURGERY  1980's X 2   "ruptured disc"  . PERCUTANEOUS CORONARY STENT INTERVENTION (PCI-S)  01/30/2014   Procedure: PERCUTANEOUS CORONARY STENT INTERVENTION (PCI-S);  Surgeon: Sinclair Grooms,  MD;  Location: [facility] CATH LAB;  Service: Cardiovascular;;  . SHOULDER OPEN ROTATOR CUFF REPAIR Right 1990's    Current Medications: Outpatient Medications Prior to Visit  Medication Sig Dispense Refill  . aspirin EC 81 MG tablet Take 1 tablet (81 mg total) by mouth daily. 90 tablet 3  . atorvastatin (LIPITOR) 10 MG tablet TAKE 1 TABLET BY MOUTH EVERY DAY 90 tablet 1  . colchicine 0.6 MG tablet Take 0.6 mg by mouth daily as needed (gout).     [name] losartan-hydrochlorothiazide (HYZAAR) 50-12.5 MG tablet Take 1 tablet by mouth daily. 90 tablet 3  . metFORMIN (GLUCOPHAGE) 500 MG tablet Take 500 mg by mouth daily.    [name] NITROSTAT 0.4 MG SL tablet Place 0.4 mg under the tongue every 5 (five) minutes as needed for chest pain.   0  . pantoprazole (PROTONIX) 40 MG tablet Take 1 tablet (40 mg total) by mouth daily. 30 tablet 11   No facility-administered medications prior to visit.      Allergies:   Patient has no known allergies.   Social History   Social History  . Marital status: Married    Spouse name: N/A  . Number of children: 2  . 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 Smoking status: Current Every Day Smoker    Years: 35.00    Types: Cigars  . Smokeless tobacco: Never Used  .  Alcohol use No  . Drug use: No  . Sexual activity: Yes   Other Topics Concern  . None   Social History Narrative  . None     Family History:  The patient's family history includes Cancer in his brother; Emphysema in his father; Heart disease in his brother and mother; Other in his sister.   ROS:   Please see the history of present illness.    Cough, occasional PND, easy bruising, no phlegm production  All other systems reviewed and are negative.   PHYSICAL EXAM:   VS:  BP (!) 150/80   Pulse (!) 54   Ht 5\' 10"  (1.778 m)   Wt 208 lb 6.4 oz (94.5 kg)   BMI 29.90 kg/m    GEN: Well nourished, well developed, in no acute distress    HEENT: normal  Neck: no JVD, carotid bruits, or masses Cardiac: RRR; no murmurs, rubs, or gallops,no edema  Respiratory:  clear to auscultation bilaterally, normal work of breathing GI: soft, nontender, nondistended, + BS MS: no deformity or atrophy  Skin: warm and dry, no rash Neuro:  Alert and Oriented x 3, Strength and sensation are intact Psych: euthymic mood, full affect  Wt Readings from Last 3 Encounters:  08/14/16 208 lb 6.4 oz (94.5 kg)  01/06/16 205 lb 12.8 oz (93.4 kg)  12/05/15 209 lb 6.4 oz (95 kg)      Studies/Labs Reviewed:   EKG:  EKG  Normal sinus rhythm, left axis deviation,, poor R-wave progression, and nonspecific ST-T wave abnormality. No significant change when compared to prior.  Recent Labs: No results found for requested labs within last 8760 hours.   Lipid Panel    Component Value Date/Time   CHOL 90 (L) 07/18/2015 0724   TRIG 149 07/18/2015 0724   HDL 26 (L) 07/18/2015 0724   CHOLHDL 3.5 07/18/2015 0724   VLDL 30 07/18/2015 0724   LDLCALC 34 07/18/2015 0724    Additional studies/ records that were reviewed today include:   LAD CTO per P. Martinique 2016. January-- > Overlapping DES   ASSESSMENT:    1. Dyspnea on exertion   2. Coronary artery disease involving native coronary artery of native heart with angina pectoris ([place])   3. Essential hypertension   4. Left anterior fascicular hemiblock   5. Other hyperlipidemia      PLAN:  In order of problems listed above:  1. Complaint is very similar to pre-PCI on the LAD total occlusion. We need to rule out restenosis. I have recommended coronary angiography. 2. Stable without angina although his presenting complaint prior to CTO recanalization of LAD was dyspnea. He had overlapping stents in the LAD performed by Dr. Martinique in January 2016. We need to rule out reocclusion. 3. Blood pressures a little high. We will make adjustments in medication after the coronary angiogram. 4. Unchanged 5. LDL  target should be less than 70.    The patient was counseled to undergo left heart catheterization, coronary angiography, and possible percutaneous coronary intervention with stent implantation. The procedural risks and benefits were discussed in detail. The risks discussed included death, stroke, myocardial infarction, life-threatening bleeding, limb ischemia, kidney injury, allergy, and possible emergency cardiac surgery. The risk of these significant complications were estimated to occur less than 1% of the time. After discussion, the patient has agreed to proceed.   Medication Adjustments/Labs and Tests Ordered: Current medicines are reviewed at length with the patient today.  Concerns regarding medicines are outlined above.  Medication changes, Labs and Tests ordered today are listed in the Patient Instructions below. There are no Patient Instructions on file for this visit.   Signed, Sinclair Grooms, MD  08/14/2016 8:20 AM    [facility] [place], [place], [place]  [address] Phone: [phone]; Fax: [phone]

## 2016-08-14 ENCOUNTER — Encounter: Payer: Self-pay | Admitting: Interventional Cardiology

## 2016-08-14 ENCOUNTER — Encounter: Payer: Self-pay | Admitting: *Deleted

## 2016-08-14 ENCOUNTER — Ambulatory Visit (INDEPENDENT_AMBULATORY_CARE_PROVIDER_SITE_OTHER): Payer: PPO | Admitting: Interventional Cardiology

## 2016-08-14 VITALS — BP 150/80 | HR 54 | Ht 70.0 in | Wt 208.4 lb

## 2016-08-14 DIAGNOSIS — I25119 Atherosclerotic heart disease of native coronary artery with unspecified angina pectoris: Secondary | ICD-10-CM | POA: Diagnosis not present

## 2016-08-14 DIAGNOSIS — I444 Left anterior fascicular block: Secondary | ICD-10-CM

## 2016-08-14 DIAGNOSIS — R0600 Dyspnea, unspecified: Secondary | ICD-10-CM

## 2016-08-14 DIAGNOSIS — R0609 Other forms of dyspnea: Secondary | ICD-10-CM

## 2016-08-14 DIAGNOSIS — E784 Other hyperlipidemia: Secondary | ICD-10-CM | POA: Diagnosis not present

## 2016-08-14 DIAGNOSIS — I10 Essential (primary) hypertension: Secondary | ICD-10-CM | POA: Diagnosis not present

## 2016-08-14 DIAGNOSIS — E7849 Other hyperlipidemia: Secondary | ICD-10-CM

## 2016-08-14 NOTE — Patient Instructions (Signed)
Medication Instructions:  None  Labwork: Your physician recommends that you return for lab work prior to your heart catheterization on 09/01/16.   Testing/Procedures: Your physician has requested that you have a cardiac catheterization. Cardiac catheterization is used to diagnose and/or treat various heart conditions. Doctors may recommend this procedure for a number of different reasons. The most common reason is to evaluate chest pain. Chest pain can be a symptom of coronary artery disease (CAD), and cardiac catheterization can show whether plaque is narrowing or blocking your heart's arteries. This procedure is also used to evaluate the valves, as well as measure the blood flow and oxygen levels in different parts of your heart. For further information please visit [email or web address]. Please follow instruction sheet, as given.   Follow-Up: Your physician recommends that you schedule a follow-up appointment in: 2 weeks after your cath with Dr. Tamala Julian or a PA or NP.    Any Other Special Instructions Will Be Listed Below (If Applicable).     If you need a refill on your cardiac medications before your next appointment, please call your pharmacy.

## 2016-08-28 ENCOUNTER — Other Ambulatory Visit: Payer: PPO | Admitting: *Deleted

## 2016-08-28 DIAGNOSIS — I25119 Atherosclerotic heart disease of native coronary artery with unspecified angina pectoris: Secondary | ICD-10-CM | POA: Diagnosis not present

## 2016-08-28 DIAGNOSIS — R0600 Dyspnea, unspecified: Secondary | ICD-10-CM

## 2016-08-28 DIAGNOSIS — R0609 Other forms of dyspnea: Principal | ICD-10-CM

## 2016-08-29 LAB — PROTIME-INR
INR: 1 (ref 0.8–1.2)
PROTHROMBIN TIME: 11 s (ref 9.1–12.0)

## 2016-08-29 LAB — CBC
Hematocrit: 39.3 % (ref 37.5–51.0)
Hemoglobin: 14.1 g/dL (ref 13.0–17.7)
MCH: 33.7 pg — ABNORMAL HIGH (ref 26.6–33.0)
MCHC: 35.9 g/dL — ABNORMAL HIGH (ref 31.5–35.7)
MCV: 94 fL (ref 79–97)
Platelets: 151 10*3/uL (ref 150–379)
RBC: 4.19 x10E6/uL (ref 4.14–5.80)
RDW: 13.3 % (ref 12.3–15.4)
WBC: 8.3 10*3/uL (ref 3.4–10.8)

## 2016-08-29 LAB — BASIC METABOLIC PANEL
BUN/Creatinine Ratio: 15 (ref 10–24)
BUN: 14 mg/dL (ref 8–27)
CALCIUM: 9.6 mg/dL (ref 8.6–10.2)
CO2: 24 mmol/L (ref 18–29)
CREATININE: 0.96 mg/dL (ref 0.76–1.27)
Chloride: 95 mmol/L — ABNORMAL LOW (ref 96–106)
GFR calc Af Amer: 90 mL/min/{1.73_m2} (ref >59)
GFR, EST NON AFRICAN AMERICAN: 78 mL/min/{1.73_m2} (ref >59)
GLUCOSE: 175 mg/dL — AB (ref 65–99)
Potassium: 3.9 mmol/L (ref 3.5–5.2)
Sodium: 136 mmol/L (ref 134–144)

## 2016-08-31 ENCOUNTER — Telehealth: Payer: Self-pay

## 2016-08-31 NOTE — Telephone Encounter (Signed)
Patient contacted pre-catheterization at [facility] scheduled for:  09/01/2016 @ 0730 Verified arrival time and place: yes Confirmed AM meds to be taken pre-cath with sip of water: Advised Pt to take baby ASA with sip of water  Confirmed patient has responsible person to drive home post procedure and observe patient for 24 hours:  Pt states his wife will accompany him  Addl concerns: Pt states he will hold metformin and losartan/hctz.   Pt denies any addl needs.

## 2016-09-01 ENCOUNTER — Encounter (HOSPITAL_COMMUNITY)
Admission: RE | Disposition: A | Discharge status: Home / Self Care | Payer: Self-pay | Source: Ambulatory Visit | Attending: Interventional Cardiology

## 2016-09-01 ENCOUNTER — Ambulatory Visit (HOSPITAL_COMMUNITY)
Admission: RE | Admit: 2016-09-01 | Discharge: 2016-09-01 | Disposition: A | Discharge status: Home / Self Care | Payer: PPO | Source: Ambulatory Visit | Attending: Interventional Cardiology | Admitting: Interventional Cardiology

## 2016-09-01 DIAGNOSIS — E119 Type 2 diabetes mellitus without complications: Secondary | ICD-10-CM | POA: Insufficient documentation

## 2016-09-01 DIAGNOSIS — I10 Essential (primary) hypertension: Secondary | ICD-10-CM | POA: Diagnosis not present

## 2016-09-01 DIAGNOSIS — Z7984 Long term (current) use of oral hypoglycemic drugs: Secondary | ICD-10-CM | POA: Diagnosis not present

## 2016-09-01 DIAGNOSIS — F17210 Nicotine dependence, cigarettes, uncomplicated: Secondary | ICD-10-CM | POA: Insufficient documentation

## 2016-09-01 DIAGNOSIS — Z7982 Long term (current) use of aspirin: Secondary | ICD-10-CM | POA: Diagnosis not present

## 2016-09-01 DIAGNOSIS — R0609 Other forms of dyspnea: Secondary | ICD-10-CM | POA: Diagnosis not present

## 2016-09-01 DIAGNOSIS — K219 Gastro-esophageal reflux disease without esophagitis: Secondary | ICD-10-CM | POA: Insufficient documentation

## 2016-09-01 DIAGNOSIS — T82855A Stenosis of coronary artery stent, initial encounter: Secondary | ICD-10-CM | POA: Diagnosis not present

## 2016-09-01 DIAGNOSIS — Y831 Surgical operation with implant of artificial internal device as the cause of abnormal reaction of the patient, or of later complication, without mention of misadventure at the time of the procedure: Secondary | ICD-10-CM | POA: Diagnosis not present

## 2016-09-01 DIAGNOSIS — I2510 Atherosclerotic heart disease of native coronary artery without angina pectoris: Secondary | ICD-10-CM

## 2016-09-01 DIAGNOSIS — I444 Left anterior fascicular block: Secondary | ICD-10-CM | POA: Diagnosis present

## 2016-09-01 DIAGNOSIS — R0600 Dyspnea, unspecified: Secondary | ICD-10-CM | POA: Diagnosis present

## 2016-09-01 DIAGNOSIS — E7800 Pure hypercholesterolemia, unspecified: Secondary | ICD-10-CM | POA: Insufficient documentation

## 2016-09-01 DIAGNOSIS — I25119 Atherosclerotic heart disease of native coronary artery with unspecified angina pectoris: Secondary | ICD-10-CM | POA: Diagnosis present

## 2016-09-01 HISTORY — PX: ULTRASOUND GUIDANCE FOR VASCULAR ACCESS: SHX6516

## 2016-09-01 HISTORY — PX: INTRAVASCULAR PRESSURE WIRE/FFR STUDY: CATH118243

## 2016-09-01 HISTORY — PX: LEFT HEART CATH AND CORONARY ANGIOGRAPHY: CATH118249

## 2016-09-01 LAB — POCT ACTIVATED CLOTTING TIME
ACTIVATED CLOTTING TIME: 252 s
Activated Clotting Time: 274 seconds

## 2016-09-01 SURGERY — LEFT HEART CATH AND CORONARY ANGIOGRAPHY
Anesthesia: LOCAL

## 2016-09-01 MED ORDER — MIDAZOLAM HCL 2 MG/2ML IJ SOLN
INTRAMUSCULAR | Status: DC | PRN
  Administered 2016-09-01 (×3): 1 mg via INTRAVENOUS

## 2016-09-01 MED ORDER — HEPARIN (PORCINE) IN NACL 2-0.9 UNIT/ML-% IJ SOLN
INTRAMUSCULAR | Status: AC | PRN
  Administered 2016-09-01: 1000 mL

## 2016-09-01 MED ORDER — ASPIRIN 81 MG PO CHEW: 81.0000 mg | CHEWABLE_TABLET | Freq: Every day | ORAL | Status: DC

## 2016-09-01 MED ORDER — VERAPAMIL HCL 2.5 MG/ML IV SOLN
INTRAVENOUS | Status: AC
  Filled 2016-09-01: qty 2

## 2016-09-01 MED ORDER — IOPAMIDOL (ISOVUE-370) INJECTION 76%
INTRAVENOUS | Status: AC
  Filled 2016-09-01: qty 100

## 2016-09-01 MED ORDER — ONDANSETRON HCL 4 MG/2ML IJ SOLN: 4.0000 mg | Freq: Four times a day (QID) | INTRAMUSCULAR | Status: DC | PRN

## 2016-09-01 MED ORDER — IOPAMIDOL (ISOVUE-370) INJECTION 76%
INTRAVENOUS | Status: AC
  Filled 2016-09-01: qty 50

## 2016-09-01 MED ORDER — SODIUM CHLORIDE 0.9 % WEIGHT BASED INFUSION: 1.0000 mL/kg/h | INTRAVENOUS | Status: DC

## 2016-09-01 MED ORDER — SODIUM CHLORIDE 0.9% FLUSH: 3.0000 mL | INTRAVENOUS | Status: DC | PRN

## 2016-09-01 MED ORDER — ADENOSINE (DIAGNOSTIC) 140MCG/KG/MIN
INTRAVENOUS | Status: DC | PRN
  Administered 2016-09-01: 140 ug/kg/min via INTRAVENOUS

## 2016-09-01 MED ORDER — VERAPAMIL HCL 2.5 MG/ML IV SOLN
INTRAVENOUS | Status: DC | PRN
  Administered 2016-09-01: 10 mL via INTRA_ARTERIAL

## 2016-09-01 MED ORDER — ASPIRIN 81 MG PO CHEW
CHEWABLE_TABLET | ORAL | Status: AC
  Filled 2016-09-01: qty 1

## 2016-09-01 MED ORDER — FENTANYL CITRATE (PF) 100 MCG/2ML IJ SOLN
INTRAMUSCULAR | Status: AC
  Filled 2016-09-01: qty 2

## 2016-09-01 MED ORDER — SODIUM CHLORIDE 0.9% FLUSH: 3.0000 mL | Freq: Two times a day (BID) | INTRAVENOUS | Status: DC

## 2016-09-01 MED ORDER — HEPARIN (PORCINE) IN NACL 2-0.9 UNIT/ML-% IJ SOLN
INTRAMUSCULAR | Status: AC
Start: 2016-09-01 — End: ?
  Filled 2016-09-01: qty 1000

## 2016-09-01 MED ORDER — LIDOCAINE HCL (PF) 1 % IJ SOLN
INTRAMUSCULAR | Status: AC
  Filled 2016-09-01: qty 30

## 2016-09-01 MED ORDER — ACETAMINOPHEN 325 MG PO TABS: 650.0000 mg | ORAL_TABLET | ORAL | Status: DC | PRN

## 2016-09-01 MED ORDER — MIDAZOLAM HCL 2 MG/2ML IJ SOLN
INTRAMUSCULAR | Status: AC
  Filled 2016-09-01: qty 2

## 2016-09-01 MED ORDER — SODIUM CHLORIDE 0.9 % IV SOLN: 250.0000 mL | INTRAVENOUS | Status: DC | PRN

## 2016-09-01 MED ORDER — HEPARIN SODIUM (PORCINE) 1000 UNIT/ML IJ SOLN
INTRAMUSCULAR | Status: DC | PRN
  Administered 2016-09-01: 4500 [IU] via INTRAVENOUS
  Administered 2016-09-01: 2000 [IU] via INTRAVENOUS
  Administered 2016-09-01: 4500 [IU] via INTRAVENOUS

## 2016-09-01 MED ORDER — LIDOCAINE HCL (PF) 1 % IJ SOLN
INTRAMUSCULAR | Status: DC | PRN
  Administered 2016-09-01: 2 mL

## 2016-09-01 MED ORDER — OXYCODONE-ACETAMINOPHEN 5-325 MG PO TABS: 1.0000 | ORAL_TABLET | ORAL | Status: DC | PRN

## 2016-09-01 MED ORDER — IOPAMIDOL (ISOVUE-370) INJECTION 76%
INTRAVENOUS | Status: DC | PRN
  Administered 2016-09-01: 150 mL via INTRAVENOUS

## 2016-09-01 MED ORDER — HEPARIN SODIUM (PORCINE) 1000 UNIT/ML IJ SOLN
INTRAMUSCULAR | Status: AC
  Filled 2016-09-01: qty 1

## 2016-09-01 MED ORDER — ASPIRIN 81 MG PO CHEW: 81.0000 mg | CHEWABLE_TABLET | ORAL | Status: DC

## 2016-09-01 MED ORDER — SODIUM CHLORIDE 0.9 % WEIGHT BASED INFUSION
3.0000 mL/kg/h | INTRAVENOUS | Status: AC
  Administered 2016-09-01: 3 mL/kg/h via INTRAVENOUS

## 2016-09-01 MED ORDER — FENTANYL CITRATE (PF) 100 MCG/2ML IJ SOLN
INTRAMUSCULAR | Status: DC | PRN
Start: 2016-09-01 — End: 2016-09-01
  Administered 2016-09-01: 50 ug via INTRAVENOUS

## 2016-09-01 SURGICAL SUPPLY — 16 items
CATH INFINITI 5 FR JL3.5 (CATHETERS) ×1 IMPLANT
CATH INFINITI JR4 5F (CATHETERS) ×1 IMPLANT
CATH VISTA GUIDE 6FR XBLAD3.5 (CATHETERS) ×1 IMPLANT
COVER PRB 48X5XTLSCP FOLD TPE (BAG) IMPLANT
COVER PROBE 5X48 (BAG) ×3
DEVICE RAD COMP TR BAND LRG (VASCULAR PRODUCTS) ×1 IMPLANT
GLIDESHEATH SLEND A-KIT 6F 22G (SHEATH) ×1 IMPLANT
GUIDEWIRE INQWIRE 1.5J.035X260 (WIRE) IMPLANT
GUIDEWIRE PRESSURE COMET II (WIRE) ×1 IMPLANT
INQWIRE 1.5J .035X260CM (WIRE) ×3
KIT ENCORE 26 ADVANTAGE (KITS) ×1 IMPLANT
KIT ESSENTIALS PG (KITS) ×1 IMPLANT
KIT HEART LEFT (KITS) ×3 IMPLANT
PACK CARDIAC CATHETERIZATION (CUSTOM PROCEDURE TRAY) ×3 IMPLANT
TRANSDUCER W/STOPCOCK (MISCELLANEOUS) ×3 IMPLANT
TUBING CIL FLEX 10 FLL-RA (TUBING) ×3 IMPLANT

## 2016-09-01 NOTE — Discharge Instructions (Signed)
Hold metformin until 09/04/16   Radial Site Care Refer to this sheet in the next few weeks. These instructions provide you with information about caring for yourself after your procedure. Your health care provider may also give you more specific instructions. Your treatment has been planned according to current medical practices, but problems sometimes occur. Call your health care provider if you have any problems or questions after your procedure. What can I expect after the procedure? After your procedure, it is typical to have the following:  Bruising at the radial site that usually fades within 1-2 weeks.  Blood collecting in the tissue (hematoma) that may be painful to the touch. It should usually decrease in size and tenderness within 1-2 weeks.  Follow these instructions at home:  Take medicines only as directed by your health care provider.  You may shower 24-48 hours after the procedure or as directed by your health care provider. Remove the bandage (dressing) and gently wash the site with plain soap and water. Pat the area dry with a clean towel. Do not rub the site, because this may cause bleeding.  Do not take baths, swim, or use a hot tub until your health care provider approves.  Check your insertion site every day for redness, swelling, or drainage.  Do not apply powder or lotion to the site.  Do not flex or bend the affected arm for 24 hours or as directed by your health care provider.  Do not push or pull heavy objects with the affected arm for 24 hours or as directed by your health care provider.  Do not lift over 10 lb (4.5 kg) for 5 days after your procedure or as directed by your health care provider.  Ask your health care provider when it is okay to: ? Return to work or school. ? Resume usual physical activities or sports. ? Resume sexual activity.  Do not drive home if you are discharged the same day as the procedure. Have someone else drive you.  You may  drive 24 hours after the procedure unless otherwise instructed by your health care provider.  Do not operate machinery or power tools for 24 hours after the procedure.  If your procedure was done as an outpatient procedure, which means that you went home the same day as your procedure, a responsible adult should be with you for the first 24 hours after you arrive home.  Keep all follow-up visits as directed by your health care provider. This is important. Contact a health care provider if:  You have a fever.  You have chills.  You have increased bleeding from the radial site. Hold pressure on the site. Get help right away if:  You have unusual pain at the radial site.  You have redness, warmth, or swelling at the radial site.  You have drainage (other than a small amount of blood on the dressing) from the radial site.  The radial site is bleeding, and the bleeding does not stop after 30 minutes of holding steady pressure on the site.  Your arm or hand becomes pale, cool, tingly, or numb. This information is not intended to replace advice given to you by your health care provider. Make sure you discuss any questions you have with your health care provider. Document Released: 04/18/2010 Document Revised: 08/22/2015 Document Reviewed: 10/02/2013 Elsevier Interactive Patient Education  2018 [employer].

## 2016-09-01 NOTE — Interval H&P Note (Signed)
Cath Lab Visit (complete for each Cath Lab visit)  Clinical Evaluation Leading to the Procedure:   ACS: No.  Non-ACS:    Anginal Classification: CCS Yates  Anti-ischemic medical therapy: Minimal Therapy (1 class of medications)  Non-Invasive Test Results: No non-invasive testing performed  Prior CABG: No previous CABG      History and Physical Interval Note:  09/01/2016 7:27 AM  Steven Yates  has presented today for surgery, with the diagnosis of dyspnea on excertion  The various methods of treatment have been discussed with the patient and family. After consideration of risks, benefits and other options for treatment, the patient has consented to  Procedure(s): Left Heart Cath and Coronary Angiography (N/A) as a surgical intervention .  The patient's history has been reviewed, patient examined, no change in status, stable for surgery.  I have reviewed the patient's chart and labs.  Questions were answered to the patient's satisfaction.     Steven Yates

## 2016-09-01 NOTE — CV Procedure (Signed)
   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 FFR across the entire stented segment 0.82.  Normal LV function.  Plan continue medical therapy. Aerobic activity encouraged.

## 2016-09-01 NOTE — H&P (View-Only) (Signed)
Cardiology Office Note    Date:  08/14/2016   ID:  Steven Yates, DOB 1940-07-04, MRN [id number]  PCP:  Steven Gravel, MD  Cardiologist: Steven Grooms, MD   Chief Complaint  Patient presents with  . Follow-up  . Shortness of Breath    History of Present Illness:  Steven Yates is a 76 y.o. male  with a history of ED, DMT2, CAD s/p CTO PCI to LAD with DESx2 (03/2014), untreated hypertension, HLD, LAFB and multiple medication intolerances who presents to clinic for follow up.  He has had exertional dyspnea progressive over the past month. This is very similar to pre PCI of the chronic total occlusion of the LAD. Mild to moderate activity causes significant breathlessness that resolves with rest. He is not short of breath at rest. There is no orthopnea PND. No episodes of chest pain.   Past Medical History:  Diagnosis Date  . Coronary artery disease   . Gastroesophageal reflux 01/19/2014  . HTN (hypertension)   . Hypercholesterolemia   . 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 BACK SURGERY    . CARDIAC CATHETERIZATION  01/2014   Dr. Tamala Yates  . CARDIAC CATHETERIZATION  04/04/2014   Procedure: CORONARY/BYPASS GRAFT CTO INTERVENTION;  Surgeon: Steven M Martinique, MD;  Location: [facility] CATH LAB;  Service: Cardiovascular;;  . CHOLECYSTECTOMY  2010  . CORONARY ANGIOPLASTY WITH STENT PLACEMENT  04/04/2014   "2"  . INGUINAL HERNIA REPAIR Bilateral 1966?  Steven Yates LEFT HEART CATHETERIZATION WITH CORONARY ANGIOGRAM N/A 01/30/2014   Procedure: LEFT HEART CATHETERIZATION WITH CORONARY ANGIOGRAM;  Surgeon: Steven Grooms, MD;  Location: [facility] CATH LAB;  Service: Cardiovascular;  Laterality: N/A;  . LUMBAR [place] SURGERY  1980's X 2   "ruptured disc"  . PERCUTANEOUS CORONARY STENT INTERVENTION (PCI-S)  01/30/2014   Procedure: PERCUTANEOUS CORONARY STENT INTERVENTION (PCI-S);  Surgeon: Steven Grooms,  MD;  Location: [facility] CATH LAB;  Service: Cardiovascular;;  . SHOULDER OPEN ROTATOR CUFF REPAIR Right 1990's    Current Medications: Outpatient Medications Prior to Visit  Medication Sig Dispense Refill  . aspirin EC 81 MG tablet Take 1 tablet (81 mg total) by mouth daily. 90 tablet 3  . atorvastatin (LIPITOR) 10 MG tablet TAKE 1 TABLET BY MOUTH EVERY DAY 90 tablet 1  . colchicine 0.6 MG tablet Take 0.6 mg by mouth daily as needed (gout).     Steven Yates losartan-hydrochlorothiazide (HYZAAR) 50-12.5 MG tablet Take 1 tablet by mouth daily. 90 tablet 3  . metFORMIN (GLUCOPHAGE) 500 MG tablet Take 500 mg by mouth daily.    Steven Yates NITROSTAT 0.4 MG SL tablet Place 0.4 mg under the tongue every 5 (five) minutes as needed for chest pain.   0  . pantoprazole (PROTONIX) 40 MG tablet Take 1 tablet (40 mg total) by mouth daily. 30 tablet 11   No facility-administered medications prior to visit.      Allergies:   Patient has no known allergies.   Social History   Social History  . Marital status: Married    Spouse name: N/A  . Number of children: 2  . 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 Smoking status: Current Every Day Smoker    Years: 35.00    Types: Cigars  . Smokeless tobacco: Never Used  .  Alcohol use No  . Drug use: No  . Sexual activity: Yes   Other Topics Concern  . None   Social History Narrative  . None     Family History:  The patient's family history includes Cancer in his brother; Emphysema in his father; Heart disease in his brother and mother; Other in his sister.   ROS:   Please see the history of present illness.    Cough, occasional PND, easy bruising, no phlegm production  All other systems reviewed and are negative.   PHYSICAL EXAM:   VS:  BP (!) 150/80   Pulse (!) 54   Ht 5\' 10"  (1.778 m)   Wt 208 lb 6.4 oz (94.5 kg)   BMI 29.90 kg/m    GEN: Well nourished, well developed, in no acute distress    HEENT: normal  Neck: no JVD, carotid bruits, or masses Cardiac: RRR; no murmurs, rubs, or gallops,no edema  Respiratory:  clear to auscultation bilaterally, normal work of breathing GI: soft, nontender, nondistended, + BS MS: no deformity or atrophy  Skin: warm and dry, no rash Neuro:  Alert and Oriented x 3, Strength and sensation are intact Psych: euthymic mood, full affect  Wt Readings from Last 3 Encounters:  08/14/16 208 lb 6.4 oz (94.5 kg)  01/06/16 205 lb 12.8 oz (93.4 kg)  12/05/15 209 lb 6.4 oz (95 kg)      Studies/Labs Reviewed:   EKG:  EKG  Normal sinus rhythm, left axis deviation,, poor R-wave progression, and nonspecific ST-T wave abnormality. No significant change when compared to prior.  Recent Labs: No results found for requested labs within last 8760 hours.   Lipid Panel    Component Value Date/Time   CHOL 90 (L) 07/18/2015 0724   TRIG 149 07/18/2015 0724   HDL 26 (L) 07/18/2015 0724   CHOLHDL 3.5 07/18/2015 0724   VLDL 30 07/18/2015 0724   LDLCALC 34 07/18/2015 0724    Additional studies/ records that were reviewed today include:   LAD CTO per P. Yates 2016. January-- > Overlapping DES   ASSESSMENT:    1. Dyspnea on exertion   2. Coronary artery disease involving native coronary artery of native heart with angina pectoris ([place])   3. Essential hypertension   4. Left anterior fascicular hemiblock   5. Other hyperlipidemia      PLAN:  In order of problems listed above:  1. Complaint is very similar to pre-PCI on the LAD total occlusion. We need to rule out restenosis. I have recommended coronary angiography. 2. Stable without angina although his presenting complaint prior to CTO recanalization of LAD was dyspnea. He had overlapping stents in the LAD performed by Dr. Martinique in January 2016. We need to rule out reocclusion. 3. Blood pressures a little high. We will make adjustments in medication after the coronary angiogram. 4. Unchanged 5. LDL  target should be less than 70.    The patient was counseled to undergo left heart catheterization, coronary angiography, and possible percutaneous coronary intervention with stent implantation. The procedural risks and benefits were discussed in detail. The risks discussed included death, stroke, myocardial infarction, life-threatening bleeding, limb ischemia, kidney injury, allergy, and possible emergency cardiac surgery. The risk of these significant complications were estimated to occur less than 1% of the time. After discussion, the patient has agreed to proceed.   Medication Adjustments/Labs and Tests Ordered: Current medicines are reviewed at length with the patient today.  Concerns regarding medicines are outlined above.  Medication changes, Labs and Tests ordered today are listed in the Patient Instructions below. There are no Patient Instructions on file for this visit.   Signed, Steven Grooms, MD  08/14/2016 8:20 AM    [facility] [place], [place], [place]  [address] Phone: [phone]; Fax: [phone]

## 2016-09-02 ENCOUNTER — Encounter (HOSPITAL_COMMUNITY): Payer: Self-pay | Admitting: Interventional Cardiology

## 2016-09-16 ENCOUNTER — Ambulatory Visit: Payer: PPO | Admitting: Physician Assistant

## 2016-09-21 NOTE — Progress Notes (Signed)
Cardiology Office Note    Date:  09/23/2016   ID:  Steven Yates, DOB [birth year], MRN [id number]  PCP:  [name], MD  Cardiologist: Steven Grooms, MD   Chief Complaint  Patient presents with  . Coronary Artery Disease    History of Present Illness:  Steven Yates is a 76 y.o. male who presents with a history of ED, DMT2, CAD s/p CTO PCI to LAD with DESx2 (03/2014), untreated hypertension, HLD, LAFB and multiple medication intolerances who presents Who presented earlier this summer with complaints of exertional dyspnea and fatigue and stated it was very similar to the way he felt prior to LAD CTO intervention.  He underwent coronary angiography because of recent symptoms of dyspnea and exertional intolerance. Coronary angiography demonstrated widely patent LAD stents. Fatigue and excessive daytime sleepiness persists. He and his wife state that he does not snore. I'm not sure if this is reliable.   Past Medical History:  Diagnosis Date  . Coronary artery disease   . Gastroesophageal reflux 01/19/2014  . HTN (hypertension)   . Hypercholesterolemia   . 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 BACK SURGERY    . CARDIAC CATHETERIZATION  01/2014   Dr. Tamala Julian  . CARDIAC CATHETERIZATION  04/04/2014   Procedure: CORONARY/BYPASS GRAFT CTO INTERVENTION;  Surgeon: Peter M Martinique, MD;  Location: [facility] CATH LAB;  Service: Cardiovascular;;  . CHOLECYSTECTOMY  2010  . CORONARY ANGIOPLASTY WITH STENT PLACEMENT  04/04/2014   "2"  . INGUINAL HERNIA REPAIR Bilateral 1966?  . INTRAVASCULAR PRESSURE WIRE/FFR STUDY N/A 09/01/2016   Procedure: Intravascular Pressure Wire/FFR Study;  Surgeon: Belva Crome, MD;  Location: [place] CV LAB;  Service: Cardiovascular;  Laterality: N/A;  . LEFT HEART CATH AND CORONARY ANGIOGRAPHY N/A 09/01/2016   Procedure: Left Heart Cath and Coronary Angiography;   Surgeon: Belva Crome, MD;  Location: [place] CV LAB;  Service: Cardiovascular;  Laterality: N/A;  . LEFT HEART CATHETERIZATION WITH CORONARY ANGIOGRAM N/A 01/30/2014   Procedure: LEFT HEART CATHETERIZATION WITH CORONARY ANGIOGRAM;  Surgeon: Steven Grooms, MD;  Location: [facility] CATH LAB;  Service: Cardiovascular;  Laterality: N/A;  . LUMBAR [place] SURGERY  1980's X 2   "ruptured disc"  . PERCUTANEOUS CORONARY STENT INTERVENTION (PCI-S)  01/30/2014   Procedure: PERCUTANEOUS CORONARY STENT INTERVENTION (PCI-S);  Surgeon: Steven Grooms, MD;  Location: [facility] CATH LAB;  Service: Cardiovascular;;  . SHOULDER OPEN ROTATOR CUFF REPAIR Right 1990's  . ULTRASOUND GUIDANCE FOR VASCULAR ACCESS  09/01/2016   Procedure: Ultrasound Guidance For Vascular Access;  Surgeon: Belva Crome, MD;  Location: [place] CV LAB;  Service: Cardiovascular;;    Current Medications: Outpatient Medications Prior to Visit  Medication Sig Dispense Refill  . aspirin EC 81 MG tablet Take 1 tablet (81 mg total) by mouth daily. 90 tablet 3  . atorvastatin (LIPITOR) 10 MG tablet TAKE 1 TABLET BY MOUTH EVERY DAY 90 tablet 1  . colchicine 0.6 MG tablet Take 0.6 mg by mouth daily as needed (gout).     [name] losartan-hydrochlorothiazide (HYZAAR) 50-12.5 MG tablet Take 1 tablet by mouth daily. 90 tablet 3  . metFORMIN (GLUCOPHAGE) 500 MG tablet Take 500 mg by mouth daily.    [name] NITROSTAT 0.4 MG SL tablet Place 0.4 mg under the tongue every 5 (five) minutes as needed for chest pain.  0  . pantoprazole (PROTONIX) 40 MG tablet Take 1 tablet (40 mg total) by mouth daily. 30 tablet 11   No facility-administered medications prior to visit.      Allergies:   Patient has no known allergies.   Social History   Social History  . Marital status: Married    Spouse name: N/A  . Number of children: 2  . 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 Smoking status: Current  Every Day Smoker    Years: 35.00    Types: Cigars  . Smokeless tobacco: Never Used  . Alcohol use No  . Drug use: No  . Sexual activity: Yes   Other Topics Concern  . None   Social History Narrative  . None     Family History:  The patient's family history includes Cancer in his brother; Emphysema in his father; Heart disease in his brother and mother; Other in his sister.   ROS:   Please see the history of present illness.    Fatigue. Awakens 3-4 times per night to urinate.  All other systems reviewed and are negative.   PHYSICAL EXAM:   VS:  BP (!) 142/84 (BP Location: Right Arm)   Pulse 64   Ht 5\' 10"  (1.778 m)   Wt 209 lb (94.8 kg)   BMI 29.99 kg/m    GEN: Well nourished, well developed, in no acute distress  HEENT: normal  Neck: no JVD, carotid bruits, or masses Cardiac: RRR; no murmurs, rubs, or gallops,no edema  Respiratory:  clear to auscultation bilaterally, normal work of breathing GI: soft, nontender, nondistended, + BS MS: no deformity or atrophy  Skin: warm and dry, no rash Neuro:  Alert and Oriented x 3, Strength and sensation are intact Psych: euthymic mood, full affect  Wt Readings from Last 3 Encounters:  09/23/16 209 lb (94.8 kg)  09/01/16 208 lb (94.3 kg)  08/14/16 208 lb 6.4 oz (94.5 kg)      Studies/Labs Reviewed:   EKG:  EKG  Not repeated  Recent Labs: 08/28/2016: BUN 14; Creatinine, Ser 0.96; Hemoglobin 14.1; Platelets 151; Potassium 3.9; Sodium 136   Lipid Panel    Component Value Date/Time   CHOL 90 (L) 07/18/2015 0724   TRIG 149 07/18/2015 0724   HDL 26 (L) 07/18/2015 0724   CHOLHDL 3.5 07/18/2015 0724   VLDL 30 07/18/2015 0724   LDLCALC 34 07/18/2015 0724    Additional studies/ records that were reviewed today include:   CARDIAC CATH June 2018: Coronary Diagrams  Diagnostic Diagram           ASSESSMENT:    1. Coronary artery disease involving native coronary artery of native heart with angina pectoris ([place])   2.  Essential hypertension   3. Left anterior fascicular hemiblock   4. Other hyperlipidemia      PLAN:  In order of problems listed above:  1. No complications post-cath. Stent was noted to be widely patent. Exertional fatigue and dyspnea are of uncertain etiology. 2. Adequate control. Systolic pressure needs to be watched. I encouraged weight loss and salt restriction. 3. Not addressed 4. LDL target less than 70  Clinical follow-up in 7-9 months. Reassurance. Also discussed that he may have a sleep abnormality and perhaps a sleep study should be considered, especially if fatigue and excessive daytime sleepiness persist/worsen.   Medication Adjustments/Labs and Tests Ordered: Current medicines are reviewed at length  with the patient today.  Concerns regarding medicines are outlined above.  Medication changes, Labs and Tests ordered today are listed in the Patient Instructions below. Patient Instructions  Medication Instructions:  None  Labwork: None  Testing/Procedures: None  Follow-Up: Your physician wants you to follow-up in: 9 months with Dr. Tamala Julian.  You will receive a reminder letter in the mail two months in advance. If you don't receive a letter, please call our office to schedule the follow-up appointment.   Any Other Special Instructions Will Be Listed Below (If Applicable).     If you need a refill on your cardiac medications before your next appointment, please call your pharmacy.      Signed, Steven Grooms, MD  09/23/2016 12:59 PM    [facility] [place], [place], [place]  [address] Phone: [phone]; Fax: [phone]

## 2016-09-22 DIAGNOSIS — L821 Other seborrheic keratosis: Secondary | ICD-10-CM | POA: Diagnosis not present

## 2016-09-22 DIAGNOSIS — D1801 Hemangioma of skin and subcutaneous tissue: Secondary | ICD-10-CM | POA: Diagnosis not present

## 2016-09-22 DIAGNOSIS — Z85828 Personal history of other malignant neoplasm of skin: Secondary | ICD-10-CM | POA: Diagnosis not present

## 2016-09-22 DIAGNOSIS — C44229 Squamous cell carcinoma of skin of left ear and external auricular canal: Secondary | ICD-10-CM | POA: Diagnosis not present

## 2016-09-22 DIAGNOSIS — L570 Actinic keratosis: Secondary | ICD-10-CM | POA: Diagnosis not present

## 2016-09-22 DIAGNOSIS — L814 Other melanin hyperpigmentation: Secondary | ICD-10-CM | POA: Diagnosis not present

## 2016-09-22 DIAGNOSIS — D485 Neoplasm of uncertain behavior of skin: Secondary | ICD-10-CM | POA: Diagnosis not present

## 2016-09-23 ENCOUNTER — Ambulatory Visit (INDEPENDENT_AMBULATORY_CARE_PROVIDER_SITE_OTHER): Payer: PPO | Admitting: Interventional Cardiology

## 2016-09-23 ENCOUNTER — Encounter: Payer: Self-pay | Admitting: Interventional Cardiology

## 2016-09-23 VITALS — BP 142/84 | HR 64 | Ht 70.0 in | Wt 209.0 lb

## 2016-09-23 DIAGNOSIS — I10 Essential (primary) hypertension: Secondary | ICD-10-CM

## 2016-09-23 DIAGNOSIS — E7849 Other hyperlipidemia: Secondary | ICD-10-CM

## 2016-09-23 DIAGNOSIS — I444 Left anterior fascicular block: Secondary | ICD-10-CM

## 2016-09-23 DIAGNOSIS — I25119 Atherosclerotic heart disease of native coronary artery with unspecified angina pectoris: Secondary | ICD-10-CM | POA: Diagnosis not present

## 2016-09-23 DIAGNOSIS — E784 Other hyperlipidemia: Secondary | ICD-10-CM

## 2016-09-23 NOTE — Patient Instructions (Signed)
Medication Instructions:  None  Labwork: None  Testing/Procedures: None  Follow-Up: Your physician wants you to follow-up in: 9 months with Dr. Tamala Julian.  You will receive a reminder letter in the mail two months in advance. If you don't receive a letter, please call our office to schedule the follow-up appointment.   Any Other Special Instructions Will Be Listed Below (If Applicable).     If you need a refill on your cardiac medications before your next appointment, please call your pharmacy.

## 2016-10-02 DIAGNOSIS — E119 Type 2 diabetes mellitus without complications: Secondary | ICD-10-CM | POA: Diagnosis not present

## 2016-10-02 DIAGNOSIS — Z125 Encounter for screening for malignant neoplasm of prostate: Secondary | ICD-10-CM | POA: Diagnosis not present

## 2016-10-02 DIAGNOSIS — I10 Essential (primary) hypertension: Secondary | ICD-10-CM | POA: Diagnosis not present

## 2016-10-06 ENCOUNTER — Ambulatory Visit: Payer: PPO | Admitting: Interventional Cardiology

## 2016-10-09 DIAGNOSIS — E785 Hyperlipidemia, unspecified: Secondary | ICD-10-CM | POA: Diagnosis not present

## 2016-10-09 DIAGNOSIS — E119 Type 2 diabetes mellitus without complications: Secondary | ICD-10-CM | POA: Diagnosis not present

## 2016-10-09 DIAGNOSIS — I10 Essential (primary) hypertension: Secondary | ICD-10-CM | POA: Diagnosis not present

## 2016-10-27 DIAGNOSIS — L820 Inflamed seborrheic keratosis: Secondary | ICD-10-CM | POA: Diagnosis not present

## 2016-10-27 DIAGNOSIS — D0422 Carcinoma in situ of skin of left ear and external auricular canal: Secondary | ICD-10-CM | POA: Diagnosis not present

## 2016-10-27 DIAGNOSIS — L570 Actinic keratosis: Secondary | ICD-10-CM | POA: Diagnosis not present

## 2016-10-27 DIAGNOSIS — L821 Other seborrheic keratosis: Secondary | ICD-10-CM | POA: Diagnosis not present

## 2016-11-18 DIAGNOSIS — E119 Type 2 diabetes mellitus without complications: Secondary | ICD-10-CM | POA: Diagnosis not present

## 2016-11-27 DIAGNOSIS — E119 Type 2 diabetes mellitus without complications: Secondary | ICD-10-CM | POA: Diagnosis not present

## 2016-11-27 DIAGNOSIS — E785 Hyperlipidemia, unspecified: Secondary | ICD-10-CM | POA: Diagnosis not present

## 2016-11-27 DIAGNOSIS — I2510 Atherosclerotic heart disease of native coronary artery without angina pectoris: Secondary | ICD-10-CM | POA: Diagnosis not present

## 2016-11-27 DIAGNOSIS — I10 Essential (primary) hypertension: Secondary | ICD-10-CM | POA: Diagnosis not present

## 2016-12-08 ENCOUNTER — Other Ambulatory Visit: Payer: Self-pay | Admitting: Interventional Cardiology

## 2016-12-08 DIAGNOSIS — E785 Hyperlipidemia, unspecified: Secondary | ICD-10-CM

## 2016-12-31 ENCOUNTER — Telehealth: Payer: Self-pay | Admitting: Dietician

## 2016-12-31 NOTE — Telephone Encounter (Signed)
Mr. Steven Yates called asking for help with his diabetes diet. He wants to know more about diabetes. He says his blood sugar was 505 this am, and he has only drunk water and cut back on starches and sugars and it was down to 390 at lunch. Advised patient to call his doctor today and inform them of what his blood sugars are and to also request a referral for diabetes self management training and Medical Nutrition therapy. He verbalized understanding and appreciation.

## 2017-01-01 DIAGNOSIS — Z23 Encounter for immunization: Secondary | ICD-10-CM | POA: Diagnosis not present

## 2017-01-28 DIAGNOSIS — R351 Nocturia: Secondary | ICD-10-CM | POA: Diagnosis not present

## 2017-01-28 DIAGNOSIS — R350 Frequency of micturition: Secondary | ICD-10-CM | POA: Diagnosis not present

## 2017-02-26 DIAGNOSIS — E119 Type 2 diabetes mellitus without complications: Secondary | ICD-10-CM | POA: Diagnosis not present

## 2017-02-26 DIAGNOSIS — R197 Diarrhea, unspecified: Secondary | ICD-10-CM | POA: Diagnosis not present

## 2017-02-26 DIAGNOSIS — E785 Hyperlipidemia, unspecified: Secondary | ICD-10-CM | POA: Diagnosis not present

## 2017-03-04 ENCOUNTER — Ambulatory Visit: Payer: PPO | Admitting: Registered"

## 2017-03-05 DIAGNOSIS — I10 Essential (primary) hypertension: Secondary | ICD-10-CM | POA: Diagnosis not present

## 2017-03-05 DIAGNOSIS — I2510 Atherosclerotic heart disease of native coronary artery without angina pectoris: Secondary | ICD-10-CM | POA: Diagnosis not present

## 2017-03-05 DIAGNOSIS — E119 Type 2 diabetes mellitus without complications: Secondary | ICD-10-CM | POA: Diagnosis not present

## 2017-04-14 DIAGNOSIS — M79642 Pain in left hand: Secondary | ICD-10-CM | POA: Diagnosis not present

## 2017-04-14 DIAGNOSIS — M79641 Pain in right hand: Secondary | ICD-10-CM | POA: Diagnosis not present

## 2017-04-14 DIAGNOSIS — M65311 Trigger thumb, right thumb: Secondary | ICD-10-CM | POA: Diagnosis not present

## 2017-04-14 DIAGNOSIS — G5602 Carpal tunnel syndrome, left upper limb: Secondary | ICD-10-CM | POA: Diagnosis not present

## 2017-05-06 DIAGNOSIS — E790 Hyperuricemia without signs of inflammatory arthritis and tophaceous disease: Secondary | ICD-10-CM | POA: Diagnosis not present

## 2017-05-06 DIAGNOSIS — M1990 Unspecified osteoarthritis, unspecified site: Secondary | ICD-10-CM | POA: Diagnosis not present

## 2017-05-06 DIAGNOSIS — M109 Gout, unspecified: Secondary | ICD-10-CM | POA: Diagnosis not present

## 2017-05-06 DIAGNOSIS — M25562 Pain in left knee: Secondary | ICD-10-CM | POA: Diagnosis not present

## 2017-05-06 DIAGNOSIS — Z79899 Other long term (current) drug therapy: Secondary | ICD-10-CM | POA: Diagnosis not present

## 2017-06-04 DIAGNOSIS — E119 Type 2 diabetes mellitus without complications: Secondary | ICD-10-CM | POA: Diagnosis not present

## 2017-06-04 DIAGNOSIS — I2510 Atherosclerotic heart disease of native coronary artery without angina pectoris: Secondary | ICD-10-CM | POA: Diagnosis not present

## 2017-06-11 DIAGNOSIS — I10 Essential (primary) hypertension: Secondary | ICD-10-CM | POA: Diagnosis not present

## 2017-06-11 DIAGNOSIS — E119 Type 2 diabetes mellitus without complications: Secondary | ICD-10-CM | POA: Diagnosis not present

## 2017-06-11 DIAGNOSIS — E785 Hyperlipidemia, unspecified: Secondary | ICD-10-CM | POA: Diagnosis not present

## 2017-06-11 DIAGNOSIS — Z125 Encounter for screening for malignant neoplasm of prostate: Secondary | ICD-10-CM | POA: Diagnosis not present

## 2017-06-17 DIAGNOSIS — M65311 Trigger thumb, right thumb: Secondary | ICD-10-CM | POA: Diagnosis not present

## 2017-06-29 DIAGNOSIS — G5602 Carpal tunnel syndrome, left upper limb: Secondary | ICD-10-CM | POA: Diagnosis not present

## 2017-07-27 ENCOUNTER — Encounter: Payer: Self-pay | Admitting: Interventional Cardiology

## 2017-08-12 NOTE — Progress Notes (Signed)
Cardiology Office Note    Date:  08/13/2017   ID:  Steven Yates, DOB Aug 20, 1940, MRN [id number]  PCP:  [name], MD  Cardiologist: Sinclair Grooms, MD   Chief Complaint  Patient presents with  . Coronary Artery Disease    History of Present Illness:  Steven Yates is a 77 y.o. male with a history of ED, DMT2, CAD s/p CTO PCI to LAD with DESx2 (03/2014), untreated hypertension, HLD, LAFB and multiple medication intolerances who presents to clinic for follow up  Romie has no complaints.  States his energy level is not as good as previous.  He denies orthopnea, PND, and edema.  He has not had syncope.  No nitroglycerin use has been required.  Not had palpitations or transient neurological problems.   Past Medical History:  Diagnosis Date  . Coronary artery disease   . Gastroesophageal reflux 01/19/2014  . HTN (hypertension)   . Hypercholesterolemia   . 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 BACK SURGERY    . CARDIAC CATHETERIZATION  01/2014   Dr. Tamala Julian  . CARDIAC CATHETERIZATION  04/04/2014   Procedure: CORONARY/BYPASS GRAFT CTO INTERVENTION;  Surgeon: Peter M Martinique, MD;  Location: [facility] CATH LAB;  Service: Cardiovascular;;  . CHOLECYSTECTOMY  2010  . CORONARY ANGIOPLASTY WITH STENT PLACEMENT  04/04/2014   "2"  . INGUINAL HERNIA REPAIR Bilateral 1966?  . INTRAVASCULAR PRESSURE WIRE/FFR STUDY N/A 09/01/2016   Procedure: Intravascular Pressure Wire/FFR Study;  Surgeon: Belva Crome, MD;  Location: [place] CV LAB;  Service: Cardiovascular;  Laterality: N/A;  . LEFT HEART CATH AND CORONARY ANGIOGRAPHY N/A 09/01/2016   Procedure: Left Heart Cath and Coronary Angiography;  Surgeon: Belva Crome, MD;  Location: [place] CV LAB;  Service: Cardiovascular;  Laterality: N/A;  . LEFT HEART CATHETERIZATION WITH CORONARY ANGIOGRAM N/A 01/30/2014   Procedure: LEFT HEART  CATHETERIZATION WITH CORONARY ANGIOGRAM;  Surgeon: Sinclair Grooms, MD;  Location: [facility] CATH LAB;  Service: Cardiovascular;  Laterality: N/A;  . LUMBAR [place] SURGERY  1980's X 2   "ruptured disc"  . PERCUTANEOUS CORONARY STENT INTERVENTION (PCI-S)  01/30/2014   Procedure: PERCUTANEOUS CORONARY STENT INTERVENTION (PCI-S);  Surgeon: Sinclair Grooms, MD;  Location: [facility] CATH LAB;  Service: Cardiovascular;;  . SHOULDER OPEN ROTATOR CUFF REPAIR Right 1990's  . ULTRASOUND GUIDANCE FOR VASCULAR ACCESS  09/01/2016   Procedure: Ultrasound Guidance For Vascular Access;  Surgeon: Belva Crome, MD;  Location: [place] CV LAB;  Service: Cardiovascular;;    Current Medications: Outpatient Medications Prior to Visit  Medication Sig Dispense Refill  . aspirin EC 81 MG tablet Take 1 tablet (81 mg total) by mouth daily. 90 tablet 3  . atorvastatin (LIPITOR) 10 MG tablet TAKE 1 TABLET BY MOUTH EVERY DAY 90 tablet 2  . colchicine 0.6 MG tablet Take 0.6 mg by mouth daily as needed (gout).     [name] losartan-hydrochlorothiazide (HYZAAR) 50-12.5 MG tablet TAKE 1 TABLET BY MOUTH DAILY 90 tablet 2  . metFORMIN (GLUCOPHAGE) 500 MG tablet Take 500 mg by mouth daily.    [name] NITROSTAT 0.4 MG SL tablet Place 0.4 mg under the tongue every 5 (five) minutes as needed for chest pain.   0  . pantoprazole (PROTONIX) 40 MG tablet Take 1 tablet (40 mg total) by mouth daily. 30 tablet 11   No facility-administered medications  prior to visit.      Allergies:   Patient has no known allergies.   Social History   Socioeconomic History  . Marital status: Married    Spouse name: Not on file  . Number of children: 2  . Years of education: Not on file  . Highest education level: Not on file  Occupational History  . Occupation: [employer]  Social Needs  . Financial resource strain: Not on file  . Food insecurity:    Worry: Not on file    Inability: Not on file  . Transportation needs:    Medical: Not on file     Non-medical: Not on file  Tobacco Use  . Smoking status: Current Every Day Smoker    Years: 35.00    Types: Cigars  . Smokeless tobacco: Never Used  Substance and Sexual Activity  . Alcohol use: No    Alcohol/week: 0.0 oz  . Drug use: No  . Sexual activity: Yes  Lifestyle  . Physical activity:    Days per week: Not on file    Minutes per session: Not on file  . Stress: Not on file  Relationships  . Social connections:    Talks on phone: Not on file    Gets together: Not on file    Attends religious service: Not on file    Active member of club or organization: Not on file    Attends meetings of clubs or organizations: Not on file    Relationship status: Not on file  Other Topics Concern  . Not on file  Social History Narrative  . Not on file     Family History:  The patient's family history includes Cancer in his brother; Emphysema in his father; Heart disease in his brother and mother; Other in his sister.   ROS:   Please see the history of present illness.    Fatigue when compared to prior years.  Not exercising regularly.  Able to perform all requirements of high quality of life.  He works in his yard.  Tends to his grass. All other systems reviewed and are negative.   PHYSICAL EXAM:   VS:  BP 140/82   Pulse (!) 58   Ht 5\' 10"  (1.778 m)   Wt 208 lb (94.3 kg)   BMI 29.84 kg/m    GEN: Well nourished, well developed, in no acute distress  HEENT: normal  Neck: no JVD, carotid bruits, or masses Cardiac: RRR; no murmurs, rubs, or gallops,no edema  Respiratory:  clear to auscultation bilaterally, normal work of breathing GI: soft, nontender, nondistended, + BS MS: no deformity or atrophy  Skin: warm and dry, no rash Neuro:  Alert and Oriented x 3, Strength and sensation are intact Psych: euthymic mood, full affect  Wt Readings from Last 3 Encounters:  08/13/17 208 lb (94.3 kg)  09/23/16 209 lb (94.8 kg)  09/01/16 208 lb (94.3 kg)      Studies/Labs Reviewed:     EKG:  EKG normal sinus rhythm, poor R wave progression V1 through V4.  Not different when compared to prior tracings.  Recent Labs: 08/28/2016: BUN 14; Creatinine, Ser 0.96; Hemoglobin 14.1; Platelets 151; Potassium 3.9; Sodium 136   Lipid Panel    Component Value Date/Time   CHOL 90 (L) 07/18/2015 0724   TRIG 149 07/18/2015 0724   HDL 26 (L) 07/18/2015 0724   CHOLHDL 3.5 07/18/2015 0724   VLDL 30 07/18/2015 0724   LDLCALC 34 07/18/2015 0724  Additional studies/ records that were reviewed today include:  None    ASSESSMENT:    1. Other hyperlipidemia   2. Essential hypertension   3. Coronary artery disease involving native coronary artery of native heart with angina pectoris ([place])   4. Left anterior fascicular hemiblock      PLAN:  In order of problems listed above:  1. Last LDL when evaluated by PCP was 3 in March 2019. 2. Pressure is borderline at 140/90 mmHg.  He monitors at home and it runs typically between 120 and 322 mmHg systolic.  Low-salt diet and aerobic activity is encouraged. 3. Stable without angina or anginal equivalent. 4. Left anterior hemiblock is not currently diagnosed on today's EKG.  Pattern on today's exam is unchanged compared to prior.  Clinical follow-up with me in 1 year.  Aerobic exercise encouraged.  Monitor blood pressure with target 130/80 mmHg.  Low-salt diet    Medication Adjustments/Labs and Tests Ordered: Current medicines are reviewed at length with the patient today.  Concerns regarding medicines are outlined above.  Medication changes, Labs and Tests ordered today are listed in the Patient Instructions below. Patient Instructions  Medication Instructions:  Your physician recommends that you continue on your current medications as directed. Please refer to the Current Medication list given to you today.  Labwork: None  Testing/Procedures: None  Follow-Up: Your physician wants you to follow-up in: 1 year with Dr. Tamala Julian.   You will receive a reminder letter in the mail two months in advance. If you don't receive a letter, please call our office to schedule the follow-up appointment.   Any Other Special Instructions Will Be Listed Below (If Applicable).     If you need a refill on your cardiac medications before your next appointment, please call your pharmacy.      Signed, Sinclair Grooms, MD  08/13/2017 10:03 AM    [facility] [address], [place], [place]  [address] Phone: [phone]; Fax: [phone]

## 2017-08-13 ENCOUNTER — Encounter: Payer: Self-pay | Admitting: Interventional Cardiology

## 2017-08-13 ENCOUNTER — Ambulatory Visit: Payer: PPO | Admitting: Interventional Cardiology

## 2017-08-13 VITALS — BP 140/82 | HR 58 | Ht 70.0 in | Wt 208.0 lb

## 2017-08-13 DIAGNOSIS — E7849 Other hyperlipidemia: Secondary | ICD-10-CM

## 2017-08-13 DIAGNOSIS — I25119 Atherosclerotic heart disease of native coronary artery with unspecified angina pectoris: Secondary | ICD-10-CM | POA: Diagnosis not present

## 2017-08-13 DIAGNOSIS — I10 Essential (primary) hypertension: Secondary | ICD-10-CM

## 2017-08-13 DIAGNOSIS — I444 Left anterior fascicular block: Secondary | ICD-10-CM | POA: Diagnosis not present

## 2017-08-13 NOTE — Patient Instructions (Signed)

## 2017-09-10 ENCOUNTER — Other Ambulatory Visit: Payer: Self-pay | Admitting: Interventional Cardiology

## 2017-09-10 DIAGNOSIS — E785 Hyperlipidemia, unspecified: Secondary | ICD-10-CM

## 2017-09-13 ENCOUNTER — Other Ambulatory Visit: Payer: Self-pay

## 2017-09-13 MED ORDER — LOSARTAN POTASSIUM-HCTZ 50-12.5 MG PO TABS: 1.0000 | ORAL_TABLET | Freq: Every day | ORAL | 3 refills | Status: DC

## 2017-09-13 NOTE — Telephone Encounter (Signed)
Outpatient Medication Detail    Disp Refills Start End   losartan-hydrochlorothiazide (HYZAAR) 50-12.5 MG tablet 90 tablet 3 09/13/2017    Sig - Route: Take 1 tablet by mouth daily. - Oral   Sent to pharmacy as: losartan-hydrochlorothiazide (HYZAAR) 50-12.5 MG tablet   E-Prescribing Status: Receipt confirmed by pharmacy (09/13/2017 10:52 AM EDT)   Pharmacy   [id number] - [place], [place]

## 2017-09-28 ENCOUNTER — Telehealth: Payer: Self-pay

## 2017-09-28 NOTE — Telephone Encounter (Signed)
   [facility] Pre-operative Risk Assessment    Request for surgical clearance:  1. What type of surgery is being performed? Bilateral CTR  2. When is this surgery scheduled? 10/12/17  3. What type of clearance is required (medical clearance vs. Pharmacy clearance to hold med vs. Both)? pharmacy  4. Are there any medications that need to be held prior to surgery and how long? Aspirin   5. Practice name and name of physician performing surgery? [facility] Dr. Sudie Bailey   6. What is your office phone number [phone]   7.   What is your office fax number [phone] ATTN Derl Barrow   8.   Anesthesia type (None, local, MAC, general) ? Local with IV sedation   _________________________________________________________________   (provider comments below)

## 2017-09-29 NOTE — Telephone Encounter (Signed)
   Primary Cardiologist: Sinclair Grooms, MD  Chart reviewed as part of pre-operative protocol coverage. Given past medical history and time since last visit, based on ACC/AHA guidelines, Steven Yates would be at acceptable risk for the planned procedure without further cardiovascular testing.  Last stent to coronary artery was 2016, was seen by Dr. Tamala Julian 5.17/19 and was stable without angina.  Will check with Dr. Tamala Julian on holding ASA.    I will route this recommendation to the requesting party via Epic fax function and remove from pre-op pool.  Please call with questions.  [name], NP 09/29/2017, 1:33 PM

## 2017-09-30 NOTE — Telephone Encounter (Signed)
Okay to hold aspirin for 5 days. Cleared for carpal tunnel surgery

## 2017-10-01 NOTE — Telephone Encounter (Signed)
Notes have been faxed to requesting office 

## 2017-10-01 NOTE — Telephone Encounter (Signed)
See below notes.

## 2017-10-01 NOTE — Telephone Encounter (Signed)
Please send the surgical clearance to Derl Barrow at Dr. Vanetta Shawl office--I tried but the report just printed.  I did send copy to Dr. Amedeo Plenty.

## 2017-10-06 DIAGNOSIS — M25562 Pain in left knee: Secondary | ICD-10-CM | POA: Diagnosis not present

## 2017-10-06 DIAGNOSIS — Z79899 Other long term (current) drug therapy: Secondary | ICD-10-CM | POA: Diagnosis not present

## 2017-10-06 DIAGNOSIS — M109 Gout, unspecified: Secondary | ICD-10-CM | POA: Diagnosis not present

## 2017-10-06 DIAGNOSIS — M1990 Unspecified osteoarthritis, unspecified site: Secondary | ICD-10-CM | POA: Diagnosis not present

## 2017-10-06 DIAGNOSIS — E790 Hyperuricemia without signs of inflammatory arthritis and tophaceous disease: Secondary | ICD-10-CM | POA: Diagnosis not present

## 2017-10-12 DIAGNOSIS — G5601 Carpal tunnel syndrome, right upper limb: Secondary | ICD-10-CM | POA: Diagnosis not present

## 2017-10-12 DIAGNOSIS — G5602 Carpal tunnel syndrome, left upper limb: Secondary | ICD-10-CM | POA: Diagnosis not present

## 2017-10-12 DIAGNOSIS — G5603 Carpal tunnel syndrome, bilateral upper limbs: Secondary | ICD-10-CM | POA: Diagnosis not present

## 2017-11-01 ENCOUNTER — Ambulatory Visit: Payer: Self-pay | Admitting: Surgery

## 2017-11-01 DIAGNOSIS — K643 Fourth degree hemorrhoids: Secondary | ICD-10-CM | POA: Diagnosis not present

## 2017-11-01 DIAGNOSIS — K648 Other hemorrhoids: Secondary | ICD-10-CM | POA: Diagnosis not present

## 2017-11-01 DIAGNOSIS — M6208 Separation of muscle (nontraumatic), other site: Secondary | ICD-10-CM | POA: Diagnosis not present

## 2017-11-01 DIAGNOSIS — K642 Third degree hemorrhoids: Secondary | ICD-10-CM | POA: Diagnosis not present

## 2017-11-01 DIAGNOSIS — Z01818 Encounter for other preprocedural examination: Secondary | ICD-10-CM | POA: Diagnosis not present

## 2017-11-01 NOTE — H&P (Signed)
Steven Yates Documented: 11/01/2017 2:31 PM Location: [facility] Patient #: [id number] DOB: Feb 19, 1941 Married / Language: Steven Yates / Race: White Male  History of Present Illness Steven Hector MD; 11/01/2017 3:11 PM) The patient is a 77 year old male who presents with hemorrhoids. Note for "Hemorrhoids": ` ` ` Patient sent for surgical consultation at the request of Steven Yates  Chief Complaint: Worsening hemorrhoids with prolapse and bleeding ` ` The patient is a pleasant gentleman with history of hemorrhoids for many years. Uses preparation H liberally to try and tolerate them. Bleeding. Irritation. Occasionally prolapse. Usually can pop right back in. Had carpal tunnel release last month. Bowels became irregular. Noticed his hemorrhoids prolapsed out all the time. Very painful and swollen. Did sitz baths. Took a week for them to reduce somewhat. However they feel like there still at least partially out. Worsened with bowel movements. He is back to having a bowel movement every day. Had a colonoscopy 2 years ago with some small polyps removed in hemorrhoids noted then. No other major surprises. Followed by Steven Yates also with [facility] gastroenterology. Because of the persistent prolapse and bleeding along with increasing discomfort, he wished to do something more aggressive. Discuss with primary care physician. Steven Yates recommended surgical consultation.  No personal nor family history of GI/colon cancer, inflammatory bowel disease, irritable bowel syndrome, allergy such as Celiac Sprue, dietary/dairy problems, colitis, ulcers nor gastritis. No recent sick contacts/gastroenteritis. No travel outside the country. No changes in diet. No dysphagia to solids or liquids. No significant heartburn or reflux. No melena, hematemesis, coffee ground emesis. No evidence of prior gastric/peptic ulceration.  (Review of systems as stated in this history (HPI) or in the  review of systems. Otherwise all other 12 point ROS are negative) ` ` `   Past Surgical History Steven Yates; 11/01/2017 2:32 PM) Colon Removal - Partial Shoulder Surgery Right.  Diagnostic Studies History Steven Yates; 11/01/2017 2:32 PM) Colonoscopy 1-5 years ago  Allergies Steven Yates; 11/01/2017 2:33 PM) No Known Drug Allergies [11/01/2017]: Allergies Reconciled  Medication History Steven Yates; 11/01/2017 2:35 PM) Atorvastatin Calcium (10MG  Tablet, Oral) Active. Losartan Potassium-HCTZ (50-12.5MG  Tablet, Oral) Active. MetFORMIN HCl (500MG  Tablet, Oral) Active. Hydrocodone-Acetaminophen (5-325MG  Tablet, Oral) Active. Doxycycline Hyclate (100MG  Capsule, Oral) Active. Glimepiride (1MG  Tablet, Oral) Active. Januvia (100MG  Tablet, Oral) Active. Indomethacin ER (75MG  Capsule ER, Oral) Active. Medications Reconciled  Social History Steven Yates; 11/01/2017 2:32 PM) Caffeine use Carbonated beverages, Coffee. No alcohol use Tobacco use Current some day smoker.  Family History Steven Yates; 11/01/2017 2:32 PM) Family history unknown First Degree Relatives  Other Problems Steven Yates; 11/01/2017 2:32 PM) Diabetes Mellitus Gastroesophageal Reflux Disease Hemorrhoids High blood pressure     Review of Systems Steven Yates; 11/01/2017 2:32 PM) General Not Present- Appetite Loss, Chills, Fatigue, Fever, Night Sweats, Weight Gain and Weight Loss. Skin Not Present- Change in Wart/Mole, Dryness, Hives, Jaundice, New Lesions, Non-Healing Wounds, Rash and Ulcer. HEENT Not Present- Earache, Hearing Loss, Hoarseness, Nose Bleed, Oral Ulcers, Ringing in the Ears, Seasonal Allergies, Sinus Pain, Sore Throat, Visual Disturbances, Wears glasses/contact lenses and Yellow Eyes. Cardiovascular Not Present- Chest Pain, Difficulty Breathing Lying Down, Leg Cramps, Palpitations, Rapid Heart Rate, Shortness of Breath and Swelling of Extremities. Gastrointestinal  Present- Hemorrhoids. Not Present- Abdominal Pain, Bloating, Bloody Stool, Change in Bowel Habits, Chronic diarrhea, Constipation, Difficulty Swallowing, Excessive gas, Gets full quickly at meals, Indigestion, Nausea, Rectal Pain and Vomiting.  Vitals Steven Yates; 11/01/2017 2:35 PM) 11/01/2017 2:35 PM Weight:  209.13 lb Height: 70in Body Surface Area: 2.13 m Body Mass Index: 30.01 kg/m  Temp.: 98.44F(Oral)  Pulse: 69 (Regular)  BP: 130/80 (Sitting, Left Arm, Standard)      Physical Exam Steven Hector MD; 11/01/2017 2:52 PM)  General Mental Status-Alert. General Appearance-Not in acute distress, Not Sickly. Orientation-Oriented X3. Hydration-Well hydrated. Voice-Normal.  Integumentary Global Assessment Upon inspection and palpation of skin surfaces of the - Axillae: non-tender, no inflammation or ulceration, no drainage. and Distribution of scalp and body hair is normal. General Characteristics Temperature - normal warmth is noted.  Head and Neck Head-normocephalic, atraumatic with no lesions or palpable masses. Face Global Assessment - atraumatic, no absence of expression. Neck Global Assessment - no abnormal movements, no bruit auscultated on the right, no bruit auscultated on the left, no decreased range of motion, non-tender. Trachea-midline. Thyroid Gland Characteristics - non-tender.  Eye Eyeball - Left-Extraocular movements intact, No Nystagmus. Eyeball - Right-Extraocular movements intact, No Nystagmus. Cornea - Left-No Hazy. Cornea - Right-No Hazy. Sclera/Conjunctiva - Left-No scleral icterus, No Discharge. Sclera/Conjunctiva - Right-No scleral icterus, No Discharge. Pupil - Left-Direct reaction to light normal. Pupil - Right-Direct reaction to light normal.  ENMT Ears Pinna - Left - no drainage observed, no generalized tenderness observed. Right - no drainage observed, no generalized tenderness observed. Nose and  Sinuses External Inspection of the Nose - no destructive lesion observed. Inspection of the nares - Left - quiet respiration. Right - quiet respiration. Mouth and Throat Lips - Upper Lip - no fissures observed, no pallor noted. Lower Lip - no fissures observed, no pallor noted. Nasopharynx - no discharge present. Oral Cavity/Oropharynx - Tongue - no dryness observed. Oral Mucosa - no cyanosis observed. Hypopharynx - no evidence of airway distress observed.  Chest and Lung Exam Inspection Movements - Normal and Symmetrical. Accessory muscles - No use of accessory muscles in breathing. Palpation Palpation of the chest reveals - Non-tender. Auscultation Breath sounds - Normal and Clear.  Cardiovascular Auscultation Rhythm - Regular. Murmurs & Other Heart Sounds - Auscultation of the heart reveals - No Murmurs and No Systolic Clicks.  Abdomen Inspection Inspection of the abdomen reveals - No Visible peristalsis and No Abnormal pulsations. Umbilicus - No Bleeding, No Urine drainage. Palpation/Percussion Palpation and Percussion of the abdomen reveal - Soft, Non Tender, No Rebound tenderness, No Rigidity (guarding) and No Cutaneous hyperesthesia. Note: Abdomen soft. Moderate diastases recti. Nontender. Not distended. No umbilical or incisional hernias. No guarding.  Male Genitourinary Sexual Maturity Tanner 5 - Adult hair pattern and Adult penile size and shape. Note: No inguinal hernias. Normal external genitalia. Epididymi, testes, and spermatic cords normal without any masses.  Rectal Note: Please refer to anoscopy section. Right-sided grade 4 and 3 hemorrhoidal piles.  Peripheral Vascular Upper Extremity Inspection - Left - No Cyanotic nailbeds, Not Ischemic. Right - No Cyanotic nailbeds, Not Ischemic.  Neurologic Neurologic evaluation reveals -normal attention span and ability to concentrate, able to name objects and repeat phrases. Appropriate fund of knowledge , normal  sensation and normal coordination. Mental Status Affect - not angry, not paranoid. Cranial Nerves-Normal Bilaterally. Gait-Normal.  Neuropsychiatric Mental status exam performed with findings of-able to articulate well with normal speech/language, rate, volume and coherence, thought content normal with ability to perform basic computations and apply abstract reasoning and no evidence of hallucinations, delusions, obsessions or homicidal/suicidal ideation.  Musculoskeletal Global Assessment Spine, Ribs and Pelvis - no instability, subluxation or laxity. Right Upper Extremity - no instability, subluxation or laxity.  Lymphatic  Head & Neck  General Head & Neck Lymphatics: Bilateral - Description - No Localized lymphadenopathy. Axillary  General Axillary Region: Bilateral - Description - No Localized lymphadenopathy. Femoral & Inguinal  Generalized Femoral & Inguinal Lymphatics: Left - Description - No Localized lymphadenopathy. Right - Description - No Localized lymphadenopathy.   Results Steven Hector MD; 11/01/2017 3:08 PM) Procedures  Name Value Date Hemorrhoids Procedure Anal exam: Bleeding prolapse Internal exam: Internal Hemorrhoids (bleeding) Internal Hemorroids ( non-bleeding) prolapse Other: Right posterior prolapsed hemorrhoid. Can reduce but pops right back out. Right anterior prolapses easily. Left lateral margin grade 2 internal hemorrhoid............Marland KitchenPerianal skin clean with good hygiene. No pruritis ani. No pilonidal disease. No fissure. No abscess/fistula. Normal sphincter tone. ....Marland KitchenMarland KitchenNo external hemorrhoids. No condyloma warts. Tolerates digital and anoscopic rectal exam. Mildly hypertrophic anal papillae on right side. No warts O. Prostate enlarged but smooth. No rectal masses.  Performed: 11/01/2017 2:52 PM    Assessment & Plan Steven Hector MD; 11/01/2017 2:55 PM)  PROLAPSED INTERNAL HEMORRHOIDS, GRADE 4  (K64.3) Impression: Obvious right posterior prolapsed hemorrhoid. Worse than colonoscopy 2 years ago.  I think he will require hemorrhoidal ligation/pexing and most likely hemorrhoidectomy if one if not 2 piles to help break the cycle of prolapsed pain and bleeding. He is ready to consider surgery.  I am okay with him continuing aspirin perioperatively. Recently was cleared by cardiology for carpal tunnel release, so I do not feel strongly we need to repeat cardiac clearance.   INTERNAL BLEEDING HEMORRHOIDS (K64.8)   PROLAPSED INTERNAL HEMORRHOIDS, GRADE 3 (K64.2)  Current Plans Pt Education - [facility] Hemorrhoids (Amandajo Gonder): discussed with patient and provided information. Pt Education - Pamphlet Given - The Hemorrhoid Book: discussed with patient and provided information. ANOSCOPY, DIAGNOSTIC (46600)  ENCOUNTER FOR PREOPERATIVE EXAMINATION FOR GENERAL SURGICAL PROCEDURE (Z01.818)  Current Plans You are being scheduled for surgery- Our schedulers will call you.  You should hear from our office's scheduling department within 5 working days about the location, date, and time of surgery. We try to make accommodations for patient's preferences in scheduling surgery, but sometimes the OR schedule or the surgeon's schedule prevents [place] from making those accommodations.  If you have not heard from our office [phone]) in 5 working days, call the office and ask for your surgeon's nurse.  If you have other questions about your diagnosis, plan, or surgery, call the office and ask for your surgeon's nurse.  Pt Education - [facility] Rectal Prep for Anorectal outpatient/office surgery: discussed with patient and provided information. Pt Education - [facility] Rectal Surgery HCI (Dominque Levandowski): discussed with patient and provided information.  DIASTASIS RECTI (M62.08)  Current Plans Pt Education - [facility] Diastasis Recti: discussed with patient and provided information.

## 2017-11-15 DIAGNOSIS — G5603 Carpal tunnel syndrome, bilateral upper limbs: Secondary | ICD-10-CM | POA: Diagnosis not present

## 2017-11-15 DIAGNOSIS — G5601 Carpal tunnel syndrome, right upper limb: Secondary | ICD-10-CM | POA: Diagnosis not present

## 2017-11-15 DIAGNOSIS — M25561 Pain in right knee: Secondary | ICD-10-CM | POA: Diagnosis not present

## 2017-11-15 DIAGNOSIS — M25562 Pain in left knee: Secondary | ICD-10-CM | POA: Diagnosis not present

## 2017-11-15 DIAGNOSIS — G5602 Carpal tunnel syndrome, left upper limb: Secondary | ICD-10-CM | POA: Diagnosis not present

## 2017-12-09 DIAGNOSIS — M25562 Pain in left knee: Secondary | ICD-10-CM | POA: Diagnosis not present

## 2017-12-09 DIAGNOSIS — M25561 Pain in right knee: Secondary | ICD-10-CM | POA: Diagnosis not present

## 2017-12-09 DIAGNOSIS — M170 Bilateral primary osteoarthritis of knee: Secondary | ICD-10-CM | POA: Diagnosis not present

## 2017-12-09 NOTE — Patient Instructions (Signed)
TOVIA KISNER  12/09/2017   Your procedure is scheduled on: 12-16-17   Report to [facility] Main  Entrance    Report to Admitting at 7:30 AM    Call this number if you have problems the morning of surgery [phone]    Remember: NO SOLID FOOD AFTER MIDNIGHT THE NIGHT PRIOR TO SURGERY. NOTHING BY MOUTH EXCEPT CLEAR LIQUIDS UNTIL 3 HOURS PRIOR TO [place] SURGERY. PLEASE FINISH ENSURE DRINK PER SURGEON ORDER 3 HOURS PRIOR TO SCHEDULED SURGERY TIME WHICH NEEDS TO BE COMPLETED AT 6:30 AM.          CLEAR LIQUID DIET   Foods Allowed                                                                     Foods Excluded  Coffee and tea, regular and decaf                             liquids that you cannot  Plain Jell-O in any flavor                                             see through such as: Fruit ices (not with fruit pulp)                                     milk, soups, orange juice  Iced Popsicles                                    All solid food Carbonated beverages, regular and diet                                    Cranberry, grape and apple juices Sports drinks like Gatorade Lightly seasoned clear broth or consume(fat free) Sugar, honey syrup  Sample Menu Breakfast                                Lunch                                     Supper Cranberry juice                    Beef broth                            Chicken broth Jell-O                                     Grape juice  Apple juice Coffee or tea                        Jell-O                                      Popsicle                                                Coffee or tea                        Coffee or tea  _____________________________________________________________________       Take these medicines the morning of surgery with A SIP OF WATER: Pantoprazole (Protonix)    DO NOT TAKE ANY DIABETIC MEDICATIONS DAY OF YOUR SURGERY               You may not have any metal on your body including hair pins and              piercings  Do not wear jewelry, lotions, powders, cologne or deodorant              Men may shave face and neck.   Do not bring valuables to the hospital. [place].  Contacts, dentures or bridgework may not be worn into surgery.       Patients discharged the day of surgery will not be allowed to drive home.  Name and phone number of your driver:  Special Instructions: Please follow your prep, per your surgeon's instructions.              Please read over the following fact sheets you were given: _____________________________________________________________________  How to Manage Your Diabetes Before and After Surgery  Why is it important to control my blood sugar before and after surgery? . Improving blood sugar levels before and after surgery helps healing and can limit problems. . A way of improving blood sugar control is eating a healthy diet by: o  Eating less sugar and carbohydrates o  Increasing activity/exercise o  Talking with your doctor about reaching your blood sugar goals . High blood sugars (greater than 180 mg/dL) can raise your risk of infections and slow your recovery, so you will need to focus on controlling your diabetes during the weeks before surgery. . Make sure that the doctor who takes care of your diabetes knows about your planned surgery including the date and location.  How do I manage my blood sugar before surgery? . Check your blood sugar at least 4 times a day, starting 2 days before surgery, to make sure that the level is not too high or low. o Check your blood sugar the morning of your surgery when you wake up and every 2 hours until you get to the Short Stay unit. . If your blood sugar is less than 70 mg/dL, you will need to treat for low blood sugar: o Do not take insulin. o Treat a low blood sugar (less than 70 mg/dL) with   cup of clear juice (cranberry or apple), 4 glucose tablets, OR glucose gel. o Recheck blood sugar  in 15 minutes after treatment (to make sure it is greater than 70 mg/dL). If your blood sugar is not greater than 70 mg/dL on recheck, call [phone] for further instructions. . Report your blood sugar to the short stay nurse when you get to Short Stay.  . If you are admitted to the hospital after surgery: o Your blood sugar will be checked by the staff and you will probably be given insulin after surgery (instead of oral diabetes medicines) to make sure you have good blood sugar levels. o The goal for blood sugar control after surgery is 80-180 mg/dL.   WHAT DO I DO ABOUT MY DIABETES MEDICATION?  [name] Do not take oral diabetes medicines (pills) the morning of surgery.  . THE DAY BEFORE SURGERY, take your usual morning dose of Glimepiride (Amaryl), and you usual dose of Metformin.           Reviewed and Endorsed by [facility] Patient Education Committee, August 2015

## 2017-12-09 NOTE — Progress Notes (Signed)
08-13-17 (Epic) EKG, and LOV w/Cardiologist with f/u in 1 year.

## 2017-12-10 ENCOUNTER — Inpatient Hospital Stay (HOSPITAL_COMMUNITY)
Admission: RE | Admit: 2017-12-10 | Discharge: 2017-12-10 | Disposition: A | Discharge status: Home / Self Care | Payer: PPO | Source: Ambulatory Visit

## 2017-12-16 ENCOUNTER — Encounter (HOSPITAL_COMMUNITY): Admission: RE | Payer: Self-pay | Source: Ambulatory Visit

## 2017-12-16 ENCOUNTER — Ambulatory Visit (HOSPITAL_COMMUNITY): Admission: RE | Admit: 2017-12-16 | Payer: PPO | Source: Ambulatory Visit | Admitting: Surgery

## 2017-12-16 SURGERY — HEMORRHOIDECTOMY
Anesthesia: General

## 2017-12-17 DIAGNOSIS — I10 Essential (primary) hypertension: Secondary | ICD-10-CM | POA: Diagnosis not present

## 2017-12-17 DIAGNOSIS — Z125 Encounter for screening for malignant neoplasm of prostate: Secondary | ICD-10-CM | POA: Diagnosis not present

## 2017-12-17 DIAGNOSIS — E119 Type 2 diabetes mellitus without complications: Secondary | ICD-10-CM | POA: Diagnosis not present

## 2017-12-24 DIAGNOSIS — I2510 Atherosclerotic heart disease of native coronary artery without angina pectoris: Secondary | ICD-10-CM | POA: Diagnosis not present

## 2017-12-24 DIAGNOSIS — M109 Gout, unspecified: Secondary | ICD-10-CM | POA: Diagnosis not present

## 2017-12-24 DIAGNOSIS — E785 Hyperlipidemia, unspecified: Secondary | ICD-10-CM | POA: Diagnosis not present

## 2017-12-24 DIAGNOSIS — E119 Type 2 diabetes mellitus without complications: Secondary | ICD-10-CM | POA: Diagnosis not present

## 2017-12-24 DIAGNOSIS — I10 Essential (primary) hypertension: Secondary | ICD-10-CM | POA: Diagnosis not present

## 2017-12-24 DIAGNOSIS — Z23 Encounter for immunization: Secondary | ICD-10-CM | POA: Diagnosis not present

## 2018-01-12 DIAGNOSIS — M1712 Unilateral primary osteoarthritis, left knee: Secondary | ICD-10-CM | POA: Diagnosis not present

## 2018-01-20 DIAGNOSIS — M1712 Unilateral primary osteoarthritis, left knee: Secondary | ICD-10-CM | POA: Diagnosis not present

## 2018-01-26 DIAGNOSIS — M1712 Unilateral primary osteoarthritis, left knee: Secondary | ICD-10-CM | POA: Diagnosis not present

## 2018-03-11 DIAGNOSIS — M1712 Unilateral primary osteoarthritis, left knee: Secondary | ICD-10-CM | POA: Diagnosis not present

## 2018-03-16 ENCOUNTER — Telehealth: Payer: Self-pay | Admitting: *Deleted

## 2018-03-16 NOTE — Telephone Encounter (Signed)
   Primary Cardiologist:Henry Nicholes Stairs III, MD  Chart reviewed as part of pre-operative protocol coverage. Because of Steven Yates's past medical history and time since last visit, he/she will require a follow-up visit in order to better assess preoperative cardiovascular risk - hard to assess risk 3 months out. Given age, history, last blood pressure and comorbidities would suggest OV in late Feb/early March to address.  Pre-op covering staff: - Please schedule appointment and call patient to inform them. - Please contact requesting surgeon's office via preferred method (i.e, phone, fax) to inform them of need for appointment prior to surgery.  If applicable, this message will also be routed to pharmacy pool and/or primary cardiologist for input on holding anticoagulant/antiplatelet agent as requested below so that this information is available at time of patient's appointment. Dr Tamala Julian - aspirin. - Please route response to [facility] PREOP (the pre-op pool). Thank you.  Charlie Pitter, PA-C  03/16/2018, 5:38 PM

## 2018-03-16 NOTE — Telephone Encounter (Signed)
   [facility] Pre-operative Risk Assessment    Request for surgical clearance:  1. What type of surgery is being performed? LEFT TOTAL KNEE   2. When is this surgery scheduled? 06/06/18   3. What type of clearance is required (medical clearance vs. Pharmacy clearance to hold med vs. Both)?  MEDICAL  4. Are there any medications that need to be held prior to surgery and how long? ASA   5. Practice name and name of physician performing surgery? [facility]; DR. Wynelle Link   6. What is your office phone number [phone]    7.   What is your office fax number       [phone]  8.   Anesthesia type (None, local, MAC, general) ? CHOICE   Julaine Hua 03/16/2018, 3:22 PM  _________________________________________________________________   (provider comments below)

## 2018-03-17 NOTE — Telephone Encounter (Signed)
Pt is scheduled to see Dr.Smith on 05/10/17, clearance will be addressed at visit.

## 2018-03-28 DIAGNOSIS — M65352 Trigger finger, left little finger: Secondary | ICD-10-CM | POA: Diagnosis not present

## 2018-03-28 DIAGNOSIS — M79642 Pain in left hand: Secondary | ICD-10-CM | POA: Diagnosis not present

## 2018-03-28 DIAGNOSIS — M65342 Trigger finger, left ring finger: Secondary | ICD-10-CM | POA: Diagnosis not present

## 2018-05-09 NOTE — Progress Notes (Signed)
Cardiology Office Note:    Date:  05/10/2018   ID:  Steven Yates, DOB [birth year], MRN [id number]  PCP:  [name], MD  Cardiologist:  Sinclair Grooms, MD   Referring MD: [name], MD   Chief Complaint  Patient presents with  . Coronary Artery Disease  . Advice Only    Clearance for knee surgery    History of Present Illness:    Steven Yates is a 78 y.o. male with a hx of ED, DMT2, CAD s/p CTO PCI to LAD with DESx2 (03/2014), untreated hypertension, HLD, LAFBand multiple medication intolerances who presents to clinic for follow up and clearance for upcoming left total knee replacement by Dr. Wynelle Link.  Steven Yates has not had angina or any significant cardiopulmonary complaints.  He is relatively active but limited by knee discomfort bilaterally.  He has upcoming left total knee replacement surgery by Dr. Wynelle Link which I believe will be on his right knee.  He has not had chest discomfort, syncope, orthopnea, PND, ankle swelling, claudication, or other cardiac complaints.  He is compliant with his medication regimen.  Stent procedure was 4 years ago.  LAD was stented, the other 2 vessels were widely patent.  Dual antiplatelet therapy has been discontinued.  He is currently on aspirin once per day.  He has had no neurological complaints.  He denies medication side effects.  Past Medical History:  Diagnosis Date  . Coronary artery disease   . Gastroesophageal reflux 01/19/2014  . HTN (hypertension)   . Hypercholesterolemia   . 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 BACK SURGERY    . CARDIAC CATHETERIZATION  01/2014   Dr. Tamala Julian  . CARDIAC CATHETERIZATION  04/04/2014   Procedure: CORONARY/BYPASS GRAFT CTO INTERVENTION;  Surgeon: Peter M Martinique, MD;  Location: [facility] CATH LAB;  Service: Cardiovascular;;  . CHOLECYSTECTOMY  2010  . CORONARY ANGIOPLASTY WITH STENT PLACEMENT   04/04/2014   "2"  . INGUINAL HERNIA REPAIR Bilateral 1966?  . INTRAVASCULAR PRESSURE WIRE/FFR STUDY N/A 09/01/2016   Procedure: Intravascular Pressure Wire/FFR Study;  Surgeon: Belva Crome, MD;  Location: [place] CV LAB;  Service: Cardiovascular;  Laterality: N/A;  . LEFT HEART CATH AND CORONARY ANGIOGRAPHY N/A 09/01/2016   Procedure: Left Heart Cath and Coronary Angiography;  Surgeon: Belva Crome, MD;  Location: [place] CV LAB;  Service: Cardiovascular;  Laterality: N/A;  . LEFT HEART CATHETERIZATION WITH CORONARY ANGIOGRAM N/A 01/30/2014   Procedure: LEFT HEART CATHETERIZATION WITH CORONARY ANGIOGRAM;  Surgeon: Sinclair Grooms, MD;  Location: [facility] CATH LAB;  Service: Cardiovascular;  Laterality: N/A;  . LUMBAR [place] SURGERY  1980's X 2   "ruptured disc"  . PERCUTANEOUS CORONARY STENT INTERVENTION (PCI-S)  01/30/2014   Procedure: PERCUTANEOUS CORONARY STENT INTERVENTION (PCI-S);  Surgeon: Sinclair Grooms, MD;  Location: [facility] CATH LAB;  Service: Cardiovascular;;  . SHOULDER OPEN ROTATOR CUFF REPAIR Right 1990's  . ULTRASOUND GUIDANCE FOR VASCULAR ACCESS  09/01/2016   Procedure: Ultrasound Guidance For Vascular Access;  Surgeon: Belva Crome, MD;  Location: [place] CV LAB;  Service: Cardiovascular;;    Current Medications: Current Meds  Medication Sig  . aspirin EC 81 MG tablet Take 1 tablet (81 mg total) by mouth daily.  [name] atorvastatin (LIPITOR) 10 MG tablet TAKE 1 TABLET BY MOUTH EVERY DAY  . glimepiride (AMARYL) 1 MG tablet Take 1  tablet by mouth every morning.  [name] losartan-hydrochlorothiazide (HYZAAR) 50-12.5 MG tablet Take 1 tablet by mouth daily.  . metFORMIN (GLUCOPHAGE) 500 MG tablet Take 1,000 mg by mouth daily with breakfast.   . NITROSTAT 0.4 MG SL tablet Place 0.4 mg under the tongue every 5 (five) minutes as needed for chest pain.   . pantoprazole (PROTONIX) 40 MG tablet Take 1 tablet (40 mg total) by mouth daily.     Allergies:   Patient has no known allergies.    Social History   Socioeconomic History  . Marital status: Married    Spouse name: Not on file  . Number of children: 2  . Years of education: Not on file  . Highest education level: Not on file  Occupational History  . Occupation: [employer]  Social Needs  . Financial resource strain: Not on file  . Food insecurity:    Worry: Not on file    Inability: Not on file  . Transportation needs:    Medical: Not on file    Non-medical: Not on file  Tobacco Use  . Smoking status: Current Every Day Smoker    Years: 35.00    Types: Cigars  . Smokeless tobacco: Never Used  Substance and Sexual Activity  . Alcohol use: No    Alcohol/week: 0.0 standard drinks  . Drug use: No  . Sexual activity: Yes  Lifestyle  . Physical activity:    Days per week: Not on file    Minutes per session: Not on file  . Stress: Not on file  Relationships  . Social connections:    Talks on phone: Not on file    Gets together: Not on file    Attends religious service: Not on file    Active member of club or organization: Not on file    Attends meetings of clubs or organizations: Not on file    Relationship status: Not on file  Other Topics Concern  . Not on file  Social History Narrative  . Not on file     Family History: The patient's family history includes Cancer in his brother; Emphysema in his father; Heart disease in his brother and mother; Other in his sister.  ROS:   Please see the history of present illness.    Leg pain, severe left knee pain to be operated upon on March 9 by Dr. Wynelle Link all other systems reviewed and are negative.  EKGs/Labs/Other Studies Reviewed:    The following studies were reviewed today: No recent cardiac data.  EKG:  EKG normal sinus rhythm/bradycardia with poor R wave progression V1 through V4.  Nonspecific T wave flattening.  This tracing from Aug 13, 2017 was personally reviewed and was unchanged from prior tracings dating back to May  2018.  Recent Labs: No results found for requested labs within last 8760 hours.  Recent Lipid Panel    Component Value Date/Time   CHOL 90 (L) 07/18/2015 0724   TRIG 149 07/18/2015 0724   HDL 26 (L) 07/18/2015 0724   CHOLHDL 3.5 07/18/2015 0724   VLDL 30 07/18/2015 0724   LDLCALC 34 07/18/2015 0724    Physical Exam:    VS:  BP 128/78   Pulse 69   Ht 5\' 10"  (1.778 m)   Wt 212 lb (96.2 kg)   SpO2 95%   BMI 30.42 kg/m     Wt Readings from Last 3 Encounters:  05/10/18 212 lb (96.2 kg)  08/13/17 208 lb (94.3 kg)  09/23/16 209 lb (94.8 kg)     GEN: Overweight with abdominal obesity. No acute distress HEENT: Normal NECK: No JVD. LYMPHATICS: No lymphadenopathy CARDIAC: RRR.  No murmur, S4 gallop, no edema VASCULAR: 2+ bilateral radial and posterior tibial pulses, no bruits RESPIRATORY:  Clear to auscultation without rales, wheezing or rhonchi  ABDOMEN: Soft, non-tender, non-distended, No pulsatile mass, MUSCULOSKELETAL: No deformity  SKIN: Warm and dry NEUROLOGIC:  Alert and oriented x 3 PSYCHIATRIC:  Normal affect   ASSESSMENT:    1. Coronary artery disease involving native coronary artery of native heart with angina pectoris ([place])   2. Left anterior fascicular hemiblock   3. Essential hypertension   4. Other hyperlipidemia   5. Preoperative cardiovascular examination    PLAN:    In order of problems listed above:  1. Stable coronary disease without angina.  Currently on aspirin.  Aspirin can be pause for the upcoming knee replacement surgery if needed. 2. No change in appearance 3. Blood pressure is adequately controlled. 4. LDL target less than 70. 5. Cleared for upcoming general anesthesia/local anesthesia related to left knee replacement surgery on March 9.  Overall education and awareness concerning primary/secondary risk prevention was discussed in detail: LDL less than 70, hemoglobin A1c less than 7, blood pressure target less than 130/80 mmHg, >150  minutes of moderate aerobic activity per week, avoidance of smoking, weight control (via diet and exercise), and continued surveillance/management of/for obstructive sleep apnea.  Clinical follow-up in 1 year   Medication Adjustments/Labs and Tests Ordered: Current medicines are reviewed at length with the patient today.  Concerns regarding medicines are outlined above.  No orders of the defined types were placed in this encounter.  No orders of the defined types were placed in this encounter.   Patient Instructions  Medication Instructions:  Your physician recommends that you continue on your current medications as directed. Please refer to the Current Medication list given to you today.  If you need a refill on your cardiac medications before your next appointment, please call your pharmacy.   Lab work: None If you have labs (blood work) drawn today and your tests are completely normal, you will receive your results only by: [name] MyChart Message (if you have MyChart) OR . A paper copy in the mail If you have any lab test that is abnormal or we need to change your treatment, we will call you to review the results.  Testing/Procedures: None  Follow-Up: At [facility], you and your health needs are our priority.  As part of our continuing mission to provide you with exceptional heart care, we have created designated Provider Care Teams.  These Care Teams include your primary Cardiologist (physician) and Advanced Practice Providers (APPs -  Physician Assistants and Nurse Practitioners) who all work together to provide you with the care you need, when you need it. You will need a follow up appointment in 12 months.  Please call our office 2 months in advance to schedule this appointment.  You may see Sinclair Grooms, MD or one of the following Advanced Practice Providers on your designated Care Team:   Truitt Merle, NP [name], NP . Kathyrn Drown, NP  Any Other Special  Instructions Will Be Listed Below (If Applicable).       Signed, Sinclair Grooms, MD  05/10/2018 9:13 AM    [place]

## 2018-05-10 ENCOUNTER — Encounter: Payer: Self-pay | Admitting: Interventional Cardiology

## 2018-05-10 ENCOUNTER — Ambulatory Visit: Payer: PPO | Admitting: Interventional Cardiology

## 2018-05-10 VITALS — BP 128/78 | HR 69 | Ht 70.0 in | Wt 212.0 lb

## 2018-05-10 DIAGNOSIS — I444 Left anterior fascicular block: Secondary | ICD-10-CM | POA: Diagnosis not present

## 2018-05-10 DIAGNOSIS — Z01810 Encounter for preprocedural cardiovascular examination: Secondary | ICD-10-CM | POA: Diagnosis not present

## 2018-05-10 DIAGNOSIS — I25119 Atherosclerotic heart disease of native coronary artery with unspecified angina pectoris: Secondary | ICD-10-CM | POA: Diagnosis not present

## 2018-05-10 DIAGNOSIS — E7849 Other hyperlipidemia: Secondary | ICD-10-CM

## 2018-05-10 DIAGNOSIS — I10 Essential (primary) hypertension: Secondary | ICD-10-CM

## 2018-05-10 NOTE — Patient Instructions (Signed)

## 2018-05-26 DIAGNOSIS — M25561 Pain in right knee: Secondary | ICD-10-CM | POA: Diagnosis not present

## 2018-05-26 DIAGNOSIS — M1711 Unilateral primary osteoarthritis, right knee: Secondary | ICD-10-CM | POA: Diagnosis not present

## 2018-05-26 NOTE — Patient Instructions (Addendum)
OSTIN MATHEY  05/26/2018   Your procedure is scheduled on: 06-06-18   Report to [facility] Main  Entrance                Report to Short Stay  at            0550 AM    Call this number if you have problems the morning of surgery [phone]    Remember: Do not eat food or drink liquids :After Midnight.  BRUSH YOUR TEETH MORNING OF SURGERY AND RINSE YOUR MOUTH OUT, NO CHEWING GUM CANDY OR MINTS.     Take these medicines the morning of surgery with A SIP OF WATER: protonix DO NOT TAKE ANY DIABETIC MEDICATIONS DAY OF YOUR SURGERY                               You may not have any metal on your body including hair pins and              piercings  Do not wear jewelry,  lotions, powders or perfumes, deodorant        .              Men may shave face and neck.   Do not bring valuables to the hospital. [place].  Contacts, dentures or bridgework may not be worn into surgery.  Leave suitcase in the car. After surgery it may be brought to your room.               Please read over the following fact sheets you were given: _____________________________________________________________________           [facility] - Preparing for Surgery Before surgery, you can play an important role.  Because skin is not sterile, your skin needs to be as free of germs as possible.  You can reduce the number of germs on your skin by washing with CHG (chlorahexidine gluconate) soap before surgery.  CHG is an antiseptic cleaner which kills germs and bonds with the skin to continue killing germs even after washing. Please DO NOT use if you have an allergy to CHG or antibacterial soaps.  If your skin becomes reddened/irritated stop using the CHG and inform your nurse when you arrive at Short Stay. Do not shave (including legs and underarms) for at least 48 hours prior to the first CHG shower.  You may shave your face/neck. Please  follow these instructions carefully:  1.  Shower with CHG Soap the night before surgery and the  morning of Surgery.  2.  If you choose to wash your hair, wash your hair first as usual with your  normal  shampoo.  3.  After you shampoo, rinse your hair and body thoroughly to remove the  shampoo.                           4.  Use CHG as you would any other liquid soap.  You can apply chg directly  to the skin and wash                       Gently with a scrungie or clean washcloth.  5.  Apply the CHG Soap to your body ONLY FROM THE NECK DOWN.   Do not use on face/ open                           Wound or open sores. Avoid contact with eyes, ears mouth and genitals (private parts).                       Wash face,  Genitals (private parts) with your normal soap.             6.  Wash thoroughly, paying special attention to the area where your surgery  will be performed.  7.  Thoroughly rinse your body with warm water from the neck down.  8.  DO NOT shower/wash with your normal soap after using and rinsing off  the CHG Soap.                9.  Pat yourself dry with a clean towel.            10.  Wear clean pajamas.            11.  Place clean sheets on your bed the night of your first shower and do not  sleep with pets. Day of Surgery : Do not apply any lotions/deodorants the morning of surgery.  Please wear clean clothes to the hospital/surgery center.  FAILURE TO FOLLOW THESE INSTRUCTIONS MAY RESULT IN THE CANCELLATION OF YOUR SURGERY PATIENT SIGNATURE_________________________________  NURSE SIGNATURE__________________________________  ________________________________________________________________________  WHAT IS A BLOOD TRANSFUSION? Blood Transfusion Information  A transfusion is the replacement of blood or some of its parts. Blood is made up of multiple cells which provide different functions.  Red blood cells carry oxygen and are used for blood loss replacement.  White blood cells  fight against infection.  Platelets control bleeding.  Plasma helps clot blood.  Other blood products are available for specialized needs, such as hemophilia or other clotting disorders. BEFORE THE TRANSFUSION  Who gives blood for transfusions?   Healthy volunteers who are fully evaluated to make sure their blood is safe. This is blood bank blood. Transfusion therapy is the safest it has ever been in the practice of medicine. Before blood is taken from a donor, a complete history is taken to make sure that person has no history of diseases nor engages in risky social behavior (examples are intravenous drug use or sexual activity with multiple partners). The donor's travel history is screened to minimize risk of transmitting infections, such as malaria. The donated blood is tested for signs of infectious diseases, such as HIV and hepatitis. The blood is then tested to be sure it is compatible with you in order to minimize the chance of a transfusion reaction. If you or a relative donates blood, this is often done in anticipation of surgery and is not appropriate for emergency situations. It takes many days to process the donated blood. RISKS AND COMPLICATIONS Although transfusion therapy is very safe and saves many lives, the main dangers of transfusion include:   Getting an infectious disease.  Developing a transfusion reaction. This is an allergic reaction to something in the blood you were given. Every precaution is taken to prevent this. The decision to have a blood transfusion has been considered carefully by your caregiver before blood is given. Blood is not given unless the benefits outweigh the risks. AFTER THE TRANSFUSION  Right after  receiving a blood transfusion, you will usually feel much better and more energetic. This is especially true if your red blood cells have gotten low (anemic). The transfusion raises the level of the red blood cells which carry oxygen, and this usually  causes an energy increase.  The nurse administering the transfusion will monitor you carefully for complications. HOME CARE INSTRUCTIONS  No special instructions are needed after a transfusion. You may find your energy is better. Speak with your caregiver about any limitations on activity for underlying diseases you may have. SEEK MEDICAL CARE IF:   Your condition is not improving after your transfusion.  You develop redness or irritation at the intravenous (IV) site. SEEK IMMEDIATE MEDICAL CARE IF:  Any of the following symptoms occur over the next 12 hours:  Shaking chills.  You have a temperature by mouth above 102 F (38.9 C), not controlled by medicine.  Chest, back, or muscle pain.  People around you feel you are not acting correctly or are confused.  Shortness of breath or difficulty breathing.  Dizziness and fainting.  You get a rash or develop hives.  You have a decrease in urine output.  Your urine turns a dark color or changes to pink, red, or brown. Any of the following symptoms occur over the next 10 days:  You have a temperature by mouth above 102 F (38.9 C), not controlled by medicine.  Shortness of breath.  Weakness after normal activity.  The white part of the eye turns yellow (jaundice).  You have a decrease in the amount of urine or are urinating less often.  Your urine turns a dark color or changes to pink, red, or brown. Document Released: 03/13/2000 Document Revised: 06/08/2011 Document Reviewed: 10/31/2007 ExitCare Patient Information 2014 [place].  _______________________________________________________________________  Incentive Spirometer  An incentive spirometer is a tool that can help keep your lungs clear and active. This tool measures how well you are filling your lungs with each breath. Taking long deep breaths may help reverse or decrease the chance of developing breathing (pulmonary) problems (especially infection)  following:  A long period of time when you are unable to move or be active. BEFORE THE PROCEDURE   If the spirometer includes an indicator to show your best effort, your nurse or respiratory therapist will set it to a desired goal.  If possible, sit up straight or lean slightly forward. Try not to slouch.  Hold the incentive spirometer in an upright position. INSTRUCTIONS FOR USE  1. Sit on the edge of your bed if possible, or sit up as far as you can in bed or on a chair. 2. Hold the incentive spirometer in an upright position. 3. Breathe out normally. 4. Place the mouthpiece in your mouth and seal your lips tightly around it. 5. Breathe in slowly and as deeply as possible, raising the piston or the ball toward the top of the column. 6. Hold your breath for 3-5 seconds or for as long as possible. Allow the piston or ball to fall to the bottom of the column. 7. Remove the mouthpiece from your mouth and breathe out normally. 8. Rest for a few seconds and repeat Steps 1 through 7 at least 10 times every 1-2 hours when you are awake. Take your time and take a few normal breaths between deep breaths. 9. The spirometer may include an indicator to show your best effort. Use the indicator as a goal to work toward during each repetition. 10. After each set  of 10 deep breaths, practice coughing to be sure your lungs are clear. If you have an incision (the cut made at the time of surgery), support your incision when coughing by placing a pillow or rolled up towels firmly against it. Once you are able to get out of bed, walk around indoors and cough well. You may stop using the incentive spirometer when instructed by your caregiver.  RISKS AND COMPLICATIONS  Take your time so you do not get dizzy or light-headed.  If you are in pain, you may need to take or ask for pain medication before doing incentive spirometry. It is harder to take a deep breath if you are having pain. AFTER USE  Rest and  breathe slowly and easily.  It can be helpful to keep track of a log of your progress. Your caregiver can provide you with a simple table to help with this. If you are using the spirometer at home, follow these instructions: [place] IF:   You are having difficultly using the spirometer.  You have trouble using the spirometer as often as instructed.  Your pain medication is not giving enough relief while using the spirometer.  You develop fever of 100.5 F (38.1 C) or higher. SEEK IMMEDIATE MEDICAL CARE IF:   You cough up bloody sputum that had not been present before.  You develop fever of 102 F (38.9 C) or greater.  You develop worsening pain at or near the incision site. MAKE SURE YOU:   Understand these instructions.  Will watch your condition.  Will get help right away if you are not doing well or get worse. Document Released: 07/27/2006 Document Revised: 06/08/2011 Document Reviewed: 09/27/2006 ExitCare Patient Information 2014 ExitCare, [place].   ________________________________________________________________________ How to Manage Your Diabetes Before and After Surgery  Why is it important to control my blood sugar before and after surgery? . Improving blood sugar levels before and after surgery helps healing and can limit problems. . A way of improving blood sugar control is eating a healthy diet by: o  Eating less sugar and carbohydrates o  Increasing activity/exercise o  Talking with your doctor about reaching your blood sugar goals . High blood sugars (greater than 180 mg/dL) can raise your risk of infections and slow your recovery, so you will need to focus on controlling your diabetes during the weeks before surgery. . Make sure that the doctor who takes care of your diabetes knows about your planned surgery including the date and location.  How do I manage my blood sugar before surgery? . Check your blood sugar at least 4 times a day, starting 2  days before surgery, to make sure that the level is not too high or low. o Check your blood sugar the morning of your surgery when you wake up and every 2 hours until you get to the Short Stay unit. . If your blood sugar is less than 70 mg/dL, you will need to treat for low blood sugar: o Do not take insulin. o Treat a low blood sugar (less than 70 mg/dL) with 4 glucose tablets, OR glucose gel. o Recheck blood sugar in 15 minutes after treatment (to make sure it is greater than 70 mg/dL). If your blood sugar is not greater than 70 mg/dL on recheck, call [phone] for further instructions. . Report your blood sugar to the short stay nurse when you get to Short Stay.  . If you are admitted to the hospital after surgery: o Your  blood sugar will be checked by the staff and you will probably be given insulin after surgery (instead of oral diabetes medicines) to make sure you have good blood sugar levels. o The goal for blood sugar control after surgery is 80-180 mg/dL.   WHAT DO I DO ABOUT MY DIABETES MEDICATION?  [name] Do not take oral diabetes medicines (pills) the morning of surgery.  . The day of surgery, do not take other diabetes injectables, including Byetta (exenatide), Bydureon (exenatide ER), Victoza (liraglutide), or Trulicity (dulaglutide).

## 2018-05-26 NOTE — Progress Notes (Signed)
ekg 08-13-17 epic  Clearance 05-10-18 epic

## 2018-05-30 ENCOUNTER — Other Ambulatory Visit: Payer: Self-pay

## 2018-05-30 ENCOUNTER — Encounter (HOSPITAL_COMMUNITY)
Admission: RE | Admit: 2018-05-30 | Discharge: 2018-05-30 | Disposition: A | Discharge status: Home / Self Care | Payer: PPO | Source: Ambulatory Visit | Attending: Orthopedic Surgery | Admitting: Orthopedic Surgery

## 2018-05-30 ENCOUNTER — Encounter (HOSPITAL_COMMUNITY): Payer: Self-pay

## 2018-05-30 DIAGNOSIS — Z01812 Encounter for preprocedural laboratory examination: Secondary | ICD-10-CM | POA: Diagnosis not present

## 2018-05-30 DIAGNOSIS — M1712 Unilateral primary osteoarthritis, left knee: Secondary | ICD-10-CM | POA: Insufficient documentation

## 2018-05-30 HISTORY — DX: Gout, unspecified: M10.9

## 2018-05-30 HISTORY — DX: Unspecified osteoarthritis, unspecified site: M19.90

## 2018-05-30 HISTORY — DX: Type 2 diabetes mellitus without complications: E11.9

## 2018-05-30 LAB — CBC
HCT: 45.7 % (ref 39.0–52.0)
Hemoglobin: 15.2 g/dL (ref 13.0–17.0)
MCH: 32.3 pg (ref 26.0–34.0)
MCHC: 33.3 g/dL (ref 30.0–36.0)
MCV: 97.2 fL (ref 80.0–100.0)
Platelets: 177 10*3/uL (ref 150–400)
RBC: 4.7 MIL/uL (ref 4.22–5.81)
RDW: 13.2 % (ref 11.5–15.5)
WBC: 12.1 10*3/uL — ABNORMAL HIGH (ref 4.0–10.5)
nRBC: 0 % (ref 0.0–0.2)

## 2018-05-30 LAB — PROTIME-INR
INR: 1 (ref 0.8–1.2)
Prothrombin Time: 12.6 seconds (ref 11.4–15.2)

## 2018-05-30 LAB — COMPREHENSIVE METABOLIC PANEL
ALT: 23 U/L (ref 0–44)
AST: 20 U/L (ref 15–41)
Albumin: 5 g/dL (ref 3.5–5.0)
Alkaline Phosphatase: 61 U/L (ref 38–126)
Anion gap: 9 (ref 5–15)
BUN: 19 mg/dL (ref 8–23)
CO2: 26 mmol/L (ref 22–32)
Calcium: 9.8 mg/dL (ref 8.9–10.3)
Chloride: 98 mmol/L (ref 98–111)
Creatinine, Ser: 1 mg/dL (ref 0.61–1.24)
GFR calc Af Amer: >60 mL/min (ref >60)
GFR calc non Af Amer: >60 mL/min (ref >60)
Glucose, Bld: 96 mg/dL (ref 70–99)
Potassium: 4.1 mmol/L (ref 3.5–5.1)
SODIUM: 133 mmol/L — AB (ref 135–145)
Total Bilirubin: 0.8 mg/dL (ref 0.3–1.2)
Total Protein: 7.9 g/dL (ref 6.5–8.1)

## 2018-05-30 LAB — HEMOGLOBIN A1C
Hgb A1c MFr Bld: 5.7 % — ABNORMAL HIGH (ref 4.8–5.6)
Mean Plasma Glucose: 116.89 mg/dL

## 2018-05-30 LAB — ABO/RH: ABO/RH(D): O NEG

## 2018-05-30 LAB — SURGICAL PCR SCREEN
MRSA, PCR: NEGATIVE
Staphylococcus aureus: NEGATIVE

## 2018-05-30 LAB — GLUCOSE, CAPILLARY: Glucose-Capillary: 113 mg/dL — ABNORMAL HIGH (ref 70–99)

## 2018-05-30 LAB — APTT: APTT: 32 s (ref 24–36)

## 2018-05-30 NOTE — Progress Notes (Signed)
Cath 09-01-16 epic

## 2018-05-30 NOTE — Progress Notes (Signed)
PCP: [name] CARDIOLOGIST:Henry Tamala Julian  INFO IN Epic: hx cardiac stents 2016  INFO ON CHART: Clearance Dr. Tamala Julian epic 05-10-18  BLOOD THINNERS AND LAST DOSES :81 asa last dose 05-31-18 ____________________________________  PATIENT SYMPTOMS AT TIME OF PREOP:None

## 2018-06-01 NOTE — H&P (Signed)
TOTAL KNEE ADMISSION H&P  Patient is being admitted for left total knee arthroplasty.  Subjective:  Chief Complaint:left knee pain.  HPI: Steven Yates, 78 y.o. male, has a history of pain and functional disability in the left knee due to arthritis and has failed non-surgical conservative treatments for greater than 12 weeks to includeNSAID's and/or analgesics, corticosteriod injections, viscosupplementation injections, flexibility and strengthening excercises and activity modification.  Onset of symptoms was gradual, starting 5 years ago with gradually worsening course since that time. The patient noted no past surgery on the left knee(s).  Patient currently rates pain in the left knee(s) at 8 out of 10 with activity. Patient has night pain, worsening of pain with activity and weight bearing, pain that interferes with activities of daily living, pain with passive range of motion, crepitus and joint swelling.  Patient has evidence of periarticular osteophytes and joint space narrowing by imaging studies. There is no active infection.  Patient Active Problem List   Diagnosis Date Noted  . Essential hypertension 09/07/2014  . Hyperlipidemia 09/07/2014  . Groin hematoma 04/06/2014  . Plantar fasciitis, bilateral 03/05/2014  . 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 Abnormal nuclear stress test 01/30/2014  . Coronary artery disease involving native coronary artery of native heart with angina pectoris ([place]) 01/30/2014  . 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 Dyspnea on exertion 01/19/2014  . Gastroesophageal reflux 01/19/2014   Past Medical History:  Diagnosis Date  . Arthritis   . Coronary artery disease    03-2014 2 stents  . Diabetes mellitus without complication ([place])    type 2  . Gastroesophageal reflux 01/19/2014  . Gout   . HTN (hypertension)   . Hypercholesterolemia   . 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 BACK SURGERY     x2  . CARDIAC CATHETERIZATION  01/2014   Dr. Tamala Julian  . CARDIAC CATHETERIZATION  04/04/2014   Procedure: CORONARY/BYPASS GRAFT CTO INTERVENTION;  Surgeon: Peter M Martinique, MD;  Location: [facility] CATH LAB;  Service: Cardiovascular;;  . CARPAL TUNNEL RELEASE     bil   2019  . CHOLECYSTECTOMY  2010  . CORONARY ANGIOPLASTY WITH STENT PLACEMENT  04/04/2014   "2"  . INGUINAL HERNIA REPAIR Bilateral 1966?  . INTRAVASCULAR PRESSURE WIRE/FFR STUDY N/A 09/01/2016   Procedure: Intravascular Pressure Wire/FFR Study;  Surgeon: Belva Crome, MD;  Location: [place] CV LAB;  Service: Cardiovascular;  Laterality: N/A;  . LEFT HEART CATH AND CORONARY ANGIOGRAPHY N/A 09/01/2016   Procedure: Left Heart Cath and Coronary Angiography;  Surgeon: Belva Crome, MD;  Location: [place] CV LAB;  Service: Cardiovascular;  Laterality: N/A;  . LEFT HEART CATHETERIZATION WITH CORONARY ANGIOGRAM N/A 01/30/2014   Procedure: LEFT HEART CATHETERIZATION WITH CORONARY ANGIOGRAM;  Surgeon: Sinclair Grooms, MD;  Location: [facility] CATH LAB;  Service: Cardiovascular;  Laterality: N/A;  . LUMBAR [place] SURGERY  1980's X 2   "ruptured disc"  . PERCUTANEOUS CORONARY STENT INTERVENTION (PCI-S)  01/30/2014   Procedure: PERCUTANEOUS CORONARY STENT INTERVENTION (PCI-S);  Surgeon: Sinclair Grooms, MD;  Location: [facility] CATH LAB;  Service: Cardiovascular;;  . SHOULDER OPEN ROTATOR CUFF REPAIR Right 1990's  . ULTRASOUND GUIDANCE FOR VASCULAR ACCESS  09/01/2016   Procedure: Ultrasound Guidance For Vascular Access;  Surgeon: Belva Crome, MD;  Location: [place] CV LAB;  Service: Cardiovascular;;  Current Outpatient Medications  Medication Sig Dispense Refill Last Dose  . aspirin EC 81 MG tablet Take 1 tablet (81 mg total) by mouth daily. 90 tablet 3 Taking  . atorvastatin (LIPITOR) 10 MG tablet TAKE 1 TABLET BY MOUTH EVERY DAY (Patient taking differently: Take 10 mg by mouth  daily at 6 PM. ) 90 tablet 3 Taking  . glimepiride (AMARYL) 1 MG tablet Take 1 tablet by mouth every morning.  11 Taking  . losartan-hydrochlorothiazide (HYZAAR) 50-12.5 MG tablet Take 1 tablet by mouth daily. 90 tablet 3 Taking  . metFORMIN (GLUCOPHAGE) 500 MG tablet Take 1,000 mg by mouth daily with breakfast.    Taking  . NITROSTAT 0.4 MG SL tablet Place 0.4 mg under the tongue every 5 (five) minutes as needed for chest pain.   0 Taking  . pantoprazole (PROTONIX) 40 MG tablet Take 1 tablet (40 mg total) by mouth daily. 30 tablet 11 Taking   No Known Allergies  Social History   Tobacco Use  . Smoking status: Former Smoker    Years: 35.00    Types: Cigars    Last attempt to quit: 04/29/2018    Years since quitting: 0.0  . Smokeless tobacco: Never Used  Substance Use Topics  . Alcohol use: No    Alcohol/week: 0.0 standard drinks    Family History  Problem Relation Age of Onset  . Emphysema Father   . Heart disease Mother   . Heart disease Brother   . Cancer Brother   . Other Sister        GI DISEASE     Review of Systems  Constitutional: Negative.   HENT: Negative.   Eyes: Negative.   Respiratory: Negative.   Cardiovascular: Negative.   Gastrointestinal: Positive for heartburn. Negative for abdominal pain, blood in stool, constipation, diarrhea, melena, nausea and vomiting.  Genitourinary: Negative.   Musculoskeletal: Positive for joint pain and myalgias. Negative for back pain, falls and neck pain.  Skin: Negative.   Neurological: Negative.   Endo/Heme/Allergies: Negative.   Psychiatric/Behavioral: Negative.     Objective:  Physical Exam  Constitutional: He is oriented to person, place, and time. He appears well-developed. No distress.  Obese  HENT:  Head: Normocephalic and atraumatic.  Right Ear: External ear normal.  Left Ear: External ear normal.  Nose: Nose normal.  Mouth/Throat: Oropharynx is clear and moist.  Eyes: Conjunctivae and EOM are normal.   Neck: Normal range of motion. Neck supple.  Cardiovascular: Normal rate, regular rhythm, normal heart sounds and intact distal pulses.  No murmur heard. Respiratory: Effort normal and breath sounds normal. No respiratory distress. He has no wheezes.  GI: Soft. Bowel sounds are normal. He exhibits no distension. There is no abdominal tenderness.  Musculoskeletal:     Right hip: Normal.     Left hip: Normal.     Right knee: He exhibits decreased range of motion and swelling. He exhibits no effusion and no erythema. Tenderness found. Medial joint line tenderness noted. No lateral joint line tenderness noted.     Comments: Left Knee Exam:  Trace effusion. No Swelling. Range of motion is 5-130 degrees.  Moderate crepitus on range of motion of the knee.  Positive medial joint line tenderness No lateral joint line tenderness.  Stable knee.  Neurological: He is alert and oriented to person, place, and time. He has normal strength. No sensory deficit.  Skin: No rash noted. He is not diaphoretic. No erythema.  Psychiatric: He has a normal mood  and affect. His behavior is normal.    Vital Signs  Ht: 5 ft 10 in  Wt: 210 lbs  BMI: 30.1  BP: 122/74 R arm Pulse: 72 bpm   Imaging Review Plain radiographs demonstrate severe degenerative joint disease of the left knee(s). The overall alignment ismild varus. The bone quality appears to be good for age and reported activity level.    Assessment/Plan:  End stage primary osteoarthritis, left knee   The patient history, physical examination, clinical judgment of the provider and imaging studies are consistent with end stage degenerative joint disease of the left knee(s) and total knee arthroplasty is deemed medically necessary. The treatment options including medical management, injection therapy arthroscopy and arthroplasty were discussed at length. The risks and benefits of total knee arthroplasty were presented and reviewed. The risks due to  aseptic loosening, infection, stiffness, patella tracking problems, thromboembolic complications and other imponderables were discussed. The patient acknowledged the explanation, agreed to proceed with the plan and consent was signed. Patient is being admitted for inpatient treatment for surgery, pain control, PT, OT, prophylactic antibiotics, VTE prophylaxis, progressive ambulation and ADL's and discharge planning. The patient is planning to be discharged home with outpatient therapy.  Therapy Plans: outpatient therapy Disposition: Home with wife Planned DVT prophylaxis: Xarelto 10mg  daily DME needed: none needed PCP: [place]: Daneen Schick  Other: no anesthesia concerns  Anticipated LOS equal to or greater than 2 midnights due to - Age 4 and older with one or more of the following:  - Obesity  - Expected need for hospital services (PT, OT, Nursing) required for safe  discharge  - Anticipated need for postoperative skilled nursing care or inpatient rehab  - Active co-morbidities: Diabetes and Coronary Artery Disease OR   - Unanticipated findings during/Post Surgery: None  - Patient is a high risk of re-admission due to: None     [employer], PA-C

## 2018-06-02 NOTE — Progress Notes (Signed)
Anesthesia Chart Review   Case:  [id number] Date/Time:  06/06/18 0805   Procedure:  TOTAL KNEE ARTHROPLASTY (Left ) - 32min   Anesthesia type:  Choice   Pre-op diagnosis:  left knee osteoarthritis   Location:  [facility] ROOM 10 / [facility]   Surgeon:  Gaynelle Arabian, MD      DISCUSSION: 78 yo former smoker (quit 04/29/18) with h/o HTN, CAD (stents x2 2016), LAFB, GERD, DM II, left knee OA scheduled for above procedure 06/06/18 with Dr. Gaynelle Arabian.   Pt last seen by cardiologist, Dr. Daneen Schick, 05/10/18.  Per his note, "Stable coronary disease without angina.  Currently on aspirin.  Aspirin can be pause for the upcoming knee replacement surgery if needed.  No change in appearance.  Blood pressure is adequately controlled.  LDL target less than 70.  Cleared for upcoming general anesthesia/local anesthesia related to left knee replacement surgery on March 9."  Pt can proceed with planned procedure barring acute status change.  VS: BP (!) 144/75 (BP Location: Right Arm)   Pulse 60   Temp 36.9 C (Oral)   Resp 18   Ht 5\' 10"  (1.778 m)   Wt 94.4 kg   SpO2 99%   BMI 29.85 kg/m   PROVIDERS: [name], MD is PCP   Daneen Schick, MD is Cardiologist  LABS: Labs reviewed: Acceptable for surgery. (all labs ordered are listed, but only abnormal results are displayed)  Labs Reviewed  CBC - Abnormal; Notable for the following components:      Result Value   WBC 12.1 (*)    All other components within normal limits  COMPREHENSIVE METABOLIC PANEL - Abnormal; Notable for the following components:   Sodium 133 (*)    All other components within normal limits  HEMOGLOBIN A1C - Abnormal; Notable for the following components:   Hgb A1c MFr Bld 5.7 (*)    All other components within normal limits  GLUCOSE, CAPILLARY - Abnormal; Notable for the following components:   Glucose-Capillary 113 (*)    All other components within normal limits  SURGICAL PCR SCREEN  APTT  PROTIME-INR  TYPE AND SCREEN  ABO/RH      IMAGES:   EKG: 08/13/17 Rate 58 bpm Sinus bradycardia  Septal infarct, age undetermined   CV: Cardiac Cath 09/01/2016  Mild to moderate in-stent restenosis focally in the proximal one third and also in the distal one third of the overlapping LAD stents. FFR across the entire stented segment is 0.82.  Widely patent circumflex and right coronary artery.  Normal left ventricular systolic function.  RECOMMENDATIONS:  Continue medical therapy Past Medical History:  Diagnosis Date  . Arthritis   . Coronary artery disease    03-2014 2 stents  . Diabetes mellitus without complication ([place])    type 2  . Gastroesophageal reflux 01/19/2014  . Gout   . HTN (hypertension)   . Hypercholesterolemia   . 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 BACK SURGERY     x2  . CARDIAC CATHETERIZATION  01/2014   Dr. Tamala Julian  . CARDIAC CATHETERIZATION  04/04/2014   Procedure: CORONARY/BYPASS GRAFT CTO INTERVENTION;  Surgeon: Peter M Martinique, MD;  Location: [facility] CATH LAB;  Service: Cardiovascular;;  . CARPAL TUNNEL RELEASE     bil   2019  . CHOLECYSTECTOMY  2010  . CORONARY ANGIOPLASTY WITH STENT PLACEMENT  04/04/2014   "2"  .  INGUINAL HERNIA REPAIR Bilateral 1966?  . INTRAVASCULAR PRESSURE WIRE/FFR STUDY N/A 09/01/2016   Procedure: Intravascular Pressure Wire/FFR Study;  Surgeon: Belva Crome, MD;  Location: [place] CV LAB;  Service: Cardiovascular;  Laterality: N/A;  . LEFT HEART CATH AND CORONARY ANGIOGRAPHY N/A 09/01/2016   Procedure: Left Heart Cath and Coronary Angiography;  Surgeon: Belva Crome, MD;  Location: [place] CV LAB;  Service: Cardiovascular;  Laterality: N/A;  . LEFT HEART CATHETERIZATION WITH CORONARY ANGIOGRAM N/A 01/30/2014   Procedure: LEFT HEART CATHETERIZATION WITH CORONARY ANGIOGRAM;  Surgeon: Sinclair Grooms, MD;  Location: [facility] CATH LAB;  Service:  Cardiovascular;  Laterality: N/A;  . LUMBAR [place] SURGERY  1980's X 2   "ruptured disc"  . PERCUTANEOUS CORONARY STENT INTERVENTION (PCI-S)  01/30/2014   Procedure: PERCUTANEOUS CORONARY STENT INTERVENTION (PCI-S);  Surgeon: Sinclair Grooms, MD;  Location: [facility] CATH LAB;  Service: Cardiovascular;;  . SHOULDER OPEN ROTATOR CUFF REPAIR Right 1990's  . ULTRASOUND GUIDANCE FOR VASCULAR ACCESS  09/01/2016   Procedure: Ultrasound Guidance For Vascular Access;  Surgeon: Belva Crome, MD;  Location: [facility] CV LAB;  Service: Cardiovascular;;    MEDICATIONS: . aspirin EC 81 MG tablet  . atorvastatin (LIPITOR) 10 MG tablet  . glimepiride (AMARYL) 1 MG tablet  . losartan-hydrochlorothiazide (HYZAAR) 50-12.5 MG tablet  . metFORMIN (GLUCOPHAGE) 500 MG tablet  . NITROSTAT 0.4 MG SL tablet  . pantoprazole (PROTONIX) 40 MG tablet   No current facility-administered medications for this encounter.     Maia Plan [facility] Pre-Surgical Testing [phone] 06/02/18 3:07 PM

## 2018-06-02 NOTE — Anesthesia Preprocedure Evaluation (Addendum)
Anesthesia Evaluation  Patient identified by MRN, date of birth, ID band Patient awake    Reviewed: Allergy & Precautions, NPO status , Patient's Chart, lab work & pertinent test results  Airway Mallampati: II  TM Distance: >3 FB Neck ROM: Full    Dental no notable dental hx. (+) Edentulous Upper, Upper Dentures   Pulmonary former smoker,    Pulmonary exam normal breath sounds clear to auscultation       Cardiovascular hypertension, + CAD and + Cardiac Stents  Normal cardiovascular exam Rhythm:Regular Rate:Normal     Neuro/Psych negative neurological ROS  negative psych ROS   GI/Hepatic negative GI ROS, Neg liver ROS,   Endo/Other  diabetes, Type 2, Oral Hypoglycemic Agents  Renal/GU negative Renal ROS  negative genitourinary   Musculoskeletal negative musculoskeletal ROS (+)   Abdominal   Peds negative pediatric ROS (+)  Hematology negative hematology ROS (+)   Anesthesia Other Findings   Reproductive/Obstetrics negative OB ROS                            Anesthesia Physical Anesthesia Plan  ASA: III  Anesthesia Plan: Spinal   Post-op Pain Management:  Regional for Post-op pain   Induction:   PONV Risk Score and Plan: 1 and Treatment may vary due to age or medical condition and Propofol infusion  Airway Management Planned: Simple Face Mask  Additional Equipment:   Intra-op Plan:   Post-operative Plan:   Informed Consent: I have reviewed the patients History and Physical, chart, labs and discussed the procedure including the risks, benefits and alternatives for the proposed anesthesia with the patient or authorized representative who has indicated his/her understanding and acceptance.     Dental advisory given  Plan Discussed with: CRNA  Anesthesia Plan Comments: (See PAT note 05/30/18, Konrad Felix, PA-C)       Anesthesia Quick Evaluation

## 2018-06-05 MED ORDER — BUPIVACAINE LIPOSOME 1.3 % IJ SUSP
20.0000 mL | INTRAMUSCULAR | Status: DC
  Filled 2018-06-05: qty 20

## 2018-06-06 ENCOUNTER — Inpatient Hospital Stay (HOSPITAL_COMMUNITY): Payer: PPO | Admitting: Physician Assistant

## 2018-06-06 ENCOUNTER — Inpatient Hospital Stay (HOSPITAL_COMMUNITY): Payer: PPO | Admitting: Anesthesiology

## 2018-06-06 ENCOUNTER — Encounter (HOSPITAL_COMMUNITY)
Admission: RE | Disposition: A | Discharge status: Home / Self Care | Payer: Self-pay | Source: Home / Self Care | Attending: Orthopedic Surgery

## 2018-06-06 ENCOUNTER — Encounter (HOSPITAL_COMMUNITY): Payer: Self-pay | Admitting: *Deleted

## 2018-06-06 ENCOUNTER — Other Ambulatory Visit: Payer: Self-pay

## 2018-06-06 ENCOUNTER — Inpatient Hospital Stay (HOSPITAL_COMMUNITY)
Admission: RE | Admit: 2018-06-06 | Discharge: 2018-06-07 | DRG: 470 | Disposition: A | Discharge status: Home / Self Care | Payer: PPO | Attending: Orthopedic Surgery | Admitting: Orthopedic Surgery

## 2018-06-06 DIAGNOSIS — M1712 Unilateral primary osteoarthritis, left knee: Secondary | ICD-10-CM | POA: Diagnosis not present

## 2018-06-06 DIAGNOSIS — E669 Obesity, unspecified: Secondary | ICD-10-CM | POA: Diagnosis not present

## 2018-06-06 DIAGNOSIS — I2510 Atherosclerotic heart disease of native coronary artery without angina pectoris: Secondary | ICD-10-CM | POA: Diagnosis present

## 2018-06-06 DIAGNOSIS — E119 Type 2 diabetes mellitus without complications: Secondary | ICD-10-CM | POA: Diagnosis not present

## 2018-06-06 DIAGNOSIS — Z6829 Body mass index (BMI) 29.0-29.9, adult: Secondary | ICD-10-CM

## 2018-06-06 DIAGNOSIS — Z955 Presence of coronary angioplasty implant and graft: Secondary | ICD-10-CM

## 2018-06-06 DIAGNOSIS — F17291 Nicotine dependence, other tobacco product, in remission: Secondary | ICD-10-CM | POA: Diagnosis not present

## 2018-06-06 DIAGNOSIS — M25762 Osteophyte, left knee: Secondary | ICD-10-CM | POA: Diagnosis present

## 2018-06-06 DIAGNOSIS — E7800 Pure hypercholesterolemia, unspecified: Secondary | ICD-10-CM | POA: Diagnosis not present

## 2018-06-06 DIAGNOSIS — Z8249 Family history of ischemic heart disease and other diseases of the circulatory system: Secondary | ICD-10-CM | POA: Diagnosis not present

## 2018-06-06 DIAGNOSIS — Z79899 Other long term (current) drug therapy: Secondary | ICD-10-CM

## 2018-06-06 DIAGNOSIS — Z7984 Long term (current) use of oral hypoglycemic drugs: Secondary | ICD-10-CM | POA: Diagnosis not present

## 2018-06-06 DIAGNOSIS — G8918 Other acute postprocedural pain: Secondary | ICD-10-CM | POA: Diagnosis not present

## 2018-06-06 DIAGNOSIS — I10 Essential (primary) hypertension: Secondary | ICD-10-CM | POA: Diagnosis present

## 2018-06-06 DIAGNOSIS — K219 Gastro-esophageal reflux disease without esophagitis: Secondary | ICD-10-CM | POA: Diagnosis not present

## 2018-06-06 DIAGNOSIS — E785 Hyperlipidemia, unspecified: Secondary | ICD-10-CM | POA: Diagnosis not present

## 2018-06-06 DIAGNOSIS — Z825 Family history of asthma and other chronic lower respiratory diseases: Secondary | ICD-10-CM | POA: Diagnosis not present

## 2018-06-06 DIAGNOSIS — M1710 Unilateral primary osteoarthritis, unspecified knee: Secondary | ICD-10-CM | POA: Diagnosis present

## 2018-06-06 DIAGNOSIS — M179 Osteoarthritis of knee, unspecified: Secondary | ICD-10-CM | POA: Diagnosis present

## 2018-06-06 DIAGNOSIS — Z9049 Acquired absence of other specified parts of digestive tract: Secondary | ICD-10-CM | POA: Diagnosis not present

## 2018-06-06 DIAGNOSIS — Z7982 Long term (current) use of aspirin: Secondary | ICD-10-CM | POA: Diagnosis not present

## 2018-06-06 HISTORY — PX: TOTAL KNEE ARTHROPLASTY: SHX125

## 2018-06-06 LAB — TYPE AND SCREEN
ABO/RH(D): O NEG
Antibody Screen: NEGATIVE

## 2018-06-06 LAB — GLUCOSE, CAPILLARY
Glucose-Capillary: 87 mg/dL (ref 70–99)
Glucose-Capillary: 251 mg/dL — ABNORMAL HIGH (ref 70–99)
Glucose-Capillary: 160 mg/dL — ABNORMAL HIGH (ref 70–99)
Glucose-Capillary: 271 mg/dL — ABNORMAL HIGH (ref 70–99)
Glucose-Capillary: 191 mg/dL — ABNORMAL HIGH (ref 70–99)

## 2018-06-06 SURGERY — ARTHROPLASTY, KNEE, TOTAL
Anesthesia: Spinal | Site: Knee | Laterality: Left

## 2018-06-06 MED ORDER — GABAPENTIN 300 MG PO CAPS
300.0000 mg | ORAL_CAPSULE | Freq: Three times a day (TID) | ORAL | Status: DC
  Administered 2018-06-06 – 2018-06-07 (×3): 300 mg via ORAL
  Filled 2018-06-06 (×3): qty 1

## 2018-06-06 MED ORDER — LACTATED RINGERS IV SOLN
INTRAVENOUS | Status: DC
  Administered 2018-06-06: 06:00:00 via INTRAVENOUS

## 2018-06-06 MED ORDER — LOSARTAN POTASSIUM-HCTZ 50-12.5 MG PO TABS: 1.0000 | ORAL_TABLET | Freq: Every day | ORAL | Status: DC

## 2018-06-06 MED ORDER — ONDANSETRON HCL 4 MG/2ML IJ SOLN
INTRAMUSCULAR | Status: DC | PRN
  Administered 2018-06-06: 4 mg via INTRAVENOUS

## 2018-06-06 MED ORDER — METHOCARBAMOL 500 MG IVPB - SIMPLE MED
500.0000 mg | Freq: Four times a day (QID) | INTRAVENOUS | Status: DC | PRN
  Filled 2018-06-06: qty 50

## 2018-06-06 MED ORDER — METOCLOPRAMIDE HCL 5 MG/ML IJ SOLN: 5.0000 mg | Freq: Three times a day (TID) | INTRAMUSCULAR | Status: DC | PRN

## 2018-06-06 MED ORDER — TRANEXAMIC ACID-NACL 1000-0.7 MG/100ML-% IV SOLN
1000.0000 mg | INTRAVENOUS | Status: AC
  Administered 2018-06-06: 1000 mg via INTRAVENOUS
  Filled 2018-06-06: qty 100

## 2018-06-06 MED ORDER — FENTANYL CITRATE (PF) 100 MCG/2ML IJ SOLN: 25.0000 ug | INTRAMUSCULAR | Status: DC | PRN

## 2018-06-06 MED ORDER — NITROGLYCERIN 0.4 MG SL SUBL: 0.4000 mg | SUBLINGUAL_TABLET | SUBLINGUAL | Status: DC | PRN

## 2018-06-06 MED ORDER — METOCLOPRAMIDE HCL 5 MG PO TABS: 5.0000 mg | ORAL_TABLET | Freq: Three times a day (TID) | ORAL | Status: DC | PRN

## 2018-06-06 MED ORDER — SODIUM CHLORIDE (PF) 0.9 % IJ SOLN
INTRAMUSCULAR | Status: AC
  Filled 2018-06-06: qty 50

## 2018-06-06 MED ORDER — PROPOFOL 500 MG/50ML IV EMUL
INTRAVENOUS | Status: DC | PRN
  Administered 2018-06-06: 75 ug/kg/min via INTRAVENOUS

## 2018-06-06 MED ORDER — METOCLOPRAMIDE HCL 5 MG/ML IJ SOLN: 10.0000 mg | Freq: Once | INTRAMUSCULAR | Status: DC | PRN

## 2018-06-06 MED ORDER — ONDANSETRON HCL 4 MG PO TABS: 4.0000 mg | ORAL_TABLET | Freq: Four times a day (QID) | ORAL | Status: DC | PRN

## 2018-06-06 MED ORDER — POLYETHYLENE GLYCOL 3350 17 G PO PACK: 17.0000 g | PACK | Freq: Every day | ORAL | Status: DC | PRN

## 2018-06-06 MED ORDER — DOCUSATE SODIUM 100 MG PO CAPS
100.0000 mg | ORAL_CAPSULE | Freq: Two times a day (BID) | ORAL | Status: DC
  Administered 2018-06-06 – 2018-06-07 (×2): 100 mg via ORAL
  Filled 2018-06-06 (×2): qty 1

## 2018-06-06 MED ORDER — SODIUM CHLORIDE 0.9 % IR SOLN
Status: DC | PRN
  Administered 2018-06-06: 1000 mL

## 2018-06-06 MED ORDER — CEFAZOLIN SODIUM-DEXTROSE 2-4 GM/100ML-% IV SOLN
2.0000 g | INTRAVENOUS | Status: AC
  Administered 2018-06-06: 2 g via INTRAVENOUS
  Filled 2018-06-06: qty 100

## 2018-06-06 MED ORDER — FENTANYL CITRATE (PF) 100 MCG/2ML IJ SOLN
50.0000 ug | INTRAMUSCULAR | Status: DC
  Administered 2018-06-06: 100 ug via INTRAVENOUS

## 2018-06-06 MED ORDER — DEXTROSE IN LACTATED RINGERS 5 % IV SOLN
INTRAVENOUS | Status: DC | PRN
  Administered 2018-06-06: 08:00:00 via INTRAVENOUS

## 2018-06-06 MED ORDER — FLEET ENEMA 7-19 GM/118ML RE ENEM: 1.0000 | ENEMA | Freq: Once | RECTAL | Status: DC | PRN

## 2018-06-06 MED ORDER — MIDAZOLAM HCL 2 MG/2ML IJ SOLN
INTRAMUSCULAR | Status: AC
  Filled 2018-06-06: qty 2

## 2018-06-06 MED ORDER — ACETAMINOPHEN 10 MG/ML IV SOLN
1000.0000 mg | Freq: Four times a day (QID) | INTRAVENOUS | Status: DC
  Administered 2018-06-06: 1000 mg via INTRAVENOUS
  Filled 2018-06-06: qty 100

## 2018-06-06 MED ORDER — TRAMADOL HCL 50 MG PO TABS: 50.0000 mg | ORAL_TABLET | Freq: Four times a day (QID) | ORAL | Status: DC | PRN

## 2018-06-06 MED ORDER — CHLORHEXIDINE GLUCONATE 4 % EX LIQD: 60.0000 mL | Freq: Once | CUTANEOUS | Status: DC

## 2018-06-06 MED ORDER — LOSARTAN POTASSIUM 50 MG PO TABS
50.0000 mg | ORAL_TABLET | Freq: Every day | ORAL | Status: DC
  Administered 2018-06-07: 50 mg via ORAL
  Filled 2018-06-06: qty 1

## 2018-06-06 MED ORDER — BISACODYL 10 MG RE SUPP: 10.0000 mg | Freq: Every day | RECTAL | Status: DC | PRN

## 2018-06-06 MED ORDER — MEPERIDINE HCL 50 MG/ML IJ SOLN: 6.2500 mg | INTRAMUSCULAR | Status: DC | PRN

## 2018-06-06 MED ORDER — PANTOPRAZOLE SODIUM 40 MG PO TBEC
40.0000 mg | DELAYED_RELEASE_TABLET | Freq: Every day | ORAL | Status: DC
  Administered 2018-06-07: 40 mg via ORAL
  Filled 2018-06-06: qty 1

## 2018-06-06 MED ORDER — ACETAMINOPHEN 500 MG PO TABS
1000.0000 mg | ORAL_TABLET | Freq: Four times a day (QID) | ORAL | Status: AC
  Administered 2018-06-06 – 2018-06-07 (×4): 1000 mg via ORAL
  Filled 2018-06-06 (×4): qty 2

## 2018-06-06 MED ORDER — EPHEDRINE SULFATE-NACL 50-0.9 MG/10ML-% IV SOSY
PREFILLED_SYRINGE | INTRAVENOUS | Status: DC | PRN
  Administered 2018-06-06 (×2): 10 mg via INTRAVENOUS
  Administered 2018-06-06: 5 mg via INTRAVENOUS
  Administered 2018-06-06: 10 mg via INTRAVENOUS

## 2018-06-06 MED ORDER — PROPOFOL 10 MG/ML IV BOLUS
INTRAVENOUS | Status: AC
  Filled 2018-06-06: qty 60

## 2018-06-06 MED ORDER — GLIMEPIRIDE 1 MG PO TABS
1.0000 mg | ORAL_TABLET | Freq: Every day | ORAL | Status: DC
  Administered 2018-06-07: 1 mg via ORAL
  Filled 2018-06-06: qty 1

## 2018-06-06 MED ORDER — SODIUM CHLORIDE (PF) 0.9 % IJ SOLN
INTRAMUSCULAR | Status: DC | PRN
  Administered 2018-06-06: 60 mL

## 2018-06-06 MED ORDER — DIPHENHYDRAMINE HCL 12.5 MG/5ML PO ELIX: 12.5000 mg | ORAL_SOLUTION | ORAL | Status: DC | PRN

## 2018-06-06 MED ORDER — MORPHINE SULFATE (PF) 2 MG/ML IV SOLN: 1.0000 mg | INTRAVENOUS | Status: DC | PRN

## 2018-06-06 MED ORDER — DEXAMETHASONE SODIUM PHOSPHATE 10 MG/ML IJ SOLN
INTRAMUSCULAR | Status: DC | PRN
  Administered 2018-06-06: 8 mg via INTRAVENOUS

## 2018-06-06 MED ORDER — RIVAROXABAN 10 MG PO TABS
10.0000 mg | ORAL_TABLET | Freq: Every day | ORAL | Status: DC
  Administered 2018-06-07: 10 mg via ORAL
  Filled 2018-06-06: qty 1

## 2018-06-06 MED ORDER — SODIUM CHLORIDE 0.9 % IV SOLN
INTRAVENOUS | Status: DC
  Administered 2018-06-06: 22:00:00 via INTRAVENOUS
  Administered 2018-06-06: 1000 mL via INTRAVENOUS

## 2018-06-06 MED ORDER — BUPIVACAINE IN DEXTROSE 0.75-8.25 % IT SOLN
INTRATHECAL | Status: DC | PRN
  Administered 2018-06-06: 1.8 mL via INTRATHECAL

## 2018-06-06 MED ORDER — FENTANYL CITRATE (PF) 100 MCG/2ML IJ SOLN
INTRAMUSCULAR | Status: AC
  Administered 2018-06-06: 100 ug via INTRAVENOUS
  Filled 2018-06-06: qty 2

## 2018-06-06 MED ORDER — EPHEDRINE 5 MG/ML INJ
INTRAVENOUS | Status: AC
  Filled 2018-06-06: qty 10

## 2018-06-06 MED ORDER — PROPOFOL 10 MG/ML IV BOLUS
INTRAVENOUS | Status: DC | PRN
  Administered 2018-06-06: 30 mg via INTRAVENOUS
  Administered 2018-06-06: 20 mg via INTRAVENOUS

## 2018-06-06 MED ORDER — DEXAMETHASONE SODIUM PHOSPHATE 10 MG/ML IJ SOLN: 10.0000 mg | Freq: Once | INTRAMUSCULAR | Status: DC

## 2018-06-06 MED ORDER — ONDANSETRON HCL 4 MG/2ML IJ SOLN
INTRAMUSCULAR | Status: AC
  Filled 2018-06-06: qty 2

## 2018-06-06 MED ORDER — CEFAZOLIN SODIUM-DEXTROSE 2-4 GM/100ML-% IV SOLN
2.0000 g | Freq: Four times a day (QID) | INTRAVENOUS | Status: AC
  Administered 2018-06-06 (×2): 2 g via INTRAVENOUS
  Filled 2018-06-06 (×2): qty 100

## 2018-06-06 MED ORDER — ATORVASTATIN CALCIUM 10 MG PO TABS
10.0000 mg | ORAL_TABLET | Freq: Every day | ORAL | Status: DC
  Filled 2018-06-06: qty 1

## 2018-06-06 MED ORDER — INSULIN ASPART 100 UNIT/ML ~~LOC~~ SOLN
0.0000 [IU] | Freq: Three times a day (TID) | SUBCUTANEOUS | Status: DC
  Administered 2018-06-06: 8 [IU] via SUBCUTANEOUS
  Administered 2018-06-06: 3 [IU] via SUBCUTANEOUS
  Administered 2018-06-07: 2 [IU] via SUBCUTANEOUS

## 2018-06-06 MED ORDER — METHOCARBAMOL 500 MG PO TABS
500.0000 mg | ORAL_TABLET | Freq: Four times a day (QID) | ORAL | Status: DC | PRN
  Administered 2018-06-06 – 2018-06-07 (×3): 500 mg via ORAL
  Filled 2018-06-06 (×3): qty 1

## 2018-06-06 MED ORDER — PHENOL 1.4 % MT LIQD: 1.0000 | OROMUCOSAL | Status: DC | PRN

## 2018-06-06 MED ORDER — SODIUM CHLORIDE (PF) 0.9 % IJ SOLN
INTRAMUSCULAR | Status: AC
  Filled 2018-06-06: qty 10

## 2018-06-06 MED ORDER — DEXAMETHASONE SODIUM PHOSPHATE 10 MG/ML IJ SOLN: 8.0000 mg | Freq: Once | INTRAMUSCULAR | Status: DC

## 2018-06-06 MED ORDER — MIDAZOLAM HCL 2 MG/2ML IJ SOLN: 1.0000 mg | INTRAMUSCULAR | Status: DC

## 2018-06-06 MED ORDER — STERILE WATER FOR IRRIGATION IR SOLN
Status: DC | PRN
  Administered 2018-06-06: 2000 mL

## 2018-06-06 MED ORDER — DEXAMETHASONE SODIUM PHOSPHATE 10 MG/ML IJ SOLN
INTRAMUSCULAR | Status: AC
  Filled 2018-06-06: qty 1

## 2018-06-06 MED ORDER — MENTHOL 3 MG MT LOZG: 1.0000 | LOZENGE | OROMUCOSAL | Status: DC | PRN

## 2018-06-06 MED ORDER — OXYCODONE HCL 5 MG PO TABS
5.0000 mg | ORAL_TABLET | ORAL | Status: DC | PRN
  Administered 2018-06-06 (×2): 10 mg via ORAL
  Administered 2018-06-06: 5 mg via ORAL
  Administered 2018-06-07: 10 mg via ORAL
  Administered 2018-06-07: 5 mg via ORAL
  Filled 2018-06-06 (×3): qty 2
  Filled 2018-06-06 (×2): qty 1

## 2018-06-06 MED ORDER — ROPIVACAINE HCL 7.5 MG/ML IJ SOLN
INTRAMUSCULAR | Status: DC | PRN
  Administered 2018-06-06: 20 mL via PERINEURAL

## 2018-06-06 MED ORDER — BUPIVACAINE LIPOSOME 1.3 % IJ SUSP
INTRAMUSCULAR | Status: DC | PRN
  Administered 2018-06-06: 20 mL

## 2018-06-06 MED ORDER — HYDROCHLOROTHIAZIDE 12.5 MG PO CAPS
12.5000 mg | ORAL_CAPSULE | Freq: Every day | ORAL | Status: DC
  Administered 2018-06-07: 12.5 mg via ORAL
  Filled 2018-06-06: qty 1

## 2018-06-06 MED ORDER — ONDANSETRON HCL 4 MG/2ML IJ SOLN: 4.0000 mg | Freq: Four times a day (QID) | INTRAMUSCULAR | Status: DC | PRN

## 2018-06-06 SURGICAL SUPPLY — 66 items
APL PRP STRL LF DISP 70% ISPRP (MISCELLANEOUS) ×1
ATTUNE MED DOME PAT 41 KNEE (Knees) ×1 IMPLANT
ATTUNE PS FEM LT SZ 7 CEM KNEE (Femur) ×1 IMPLANT
ATTUNE PSRP INSR SZ7 6 KNEE (Insert) ×1 IMPLANT
BAG SPEC THK2 15X12 ZIP CLS (MISCELLANEOUS) ×1
BAG ZIPLOCK 12X15 (MISCELLANEOUS) ×2 IMPLANT
BANDAGE ACE 6X5 VEL STRL LF (GAUZE/BANDAGES/DRESSINGS) ×2 IMPLANT
BASE TIBIA ATTUNE KNEE SYS SZ6 (Knees) IMPLANT
BLADE SAG 18X100X1.27 (BLADE) ×2 IMPLANT
BLADE SAW SGTL 11.0X1.19X90.0M (BLADE) ×2 IMPLANT
BLADE SURG SZ10 CARB STEEL (BLADE) ×2 IMPLANT
BOWL SMART MIX CTS (DISPOSABLE) ×2 IMPLANT
BSPLAT TIB 6 CMNT ROT PLAT STR (Knees) ×1 IMPLANT
CEMENT HV SMART SET (Cement) ×4 IMPLANT
CHLORAPREP W/TINT 26 (MISCELLANEOUS) ×1 IMPLANT
COVER SURGICAL LIGHT HANDLE (MISCELLANEOUS) ×2 IMPLANT
COVER WAND RF STERILE (DRAPES) IMPLANT
CUFF TOURN SGL QUICK 34 (TOURNIQUET CUFF) ×2
CUFF TRNQT CYL 34X4.125X (TOURNIQUET CUFF) ×1 IMPLANT
DECANTER SPIKE VIAL GLASS SM (MISCELLANEOUS) ×3 IMPLANT
DRAPE U-SHAPE 47X51 STRL (DRAPES) ×2 IMPLANT
DRSG ADAPTIC 3X8 NADH LF (GAUZE/BANDAGES/DRESSINGS) ×2 IMPLANT
DRSG PAD ABDOMINAL 8X10 ST (GAUZE/BANDAGES/DRESSINGS) ×2 IMPLANT
DURAPREP 26ML APPLICATOR (WOUND CARE) ×1 IMPLANT
ELECT REM PT RETURN 15FT ADLT (MISCELLANEOUS) ×2 IMPLANT
EVACUATOR 1/8 PVC DRAIN (DRAIN) ×2 IMPLANT
GAUZE SPONGE 4X4 12PLY STRL (GAUZE/BANDAGES/DRESSINGS) ×2 IMPLANT
GLOVE BIO SURGEON STRL SZ7 (GLOVE) ×1 IMPLANT
GLOVE BIO SURGEON STRL SZ8 (GLOVE) ×2 IMPLANT
GLOVE BIOGEL PI IND STRL 6.5 (GLOVE) ×1 IMPLANT
GLOVE BIOGEL PI IND STRL 7.0 (GLOVE) ×1 IMPLANT
GLOVE BIOGEL PI IND STRL 7.5 (GLOVE) IMPLANT
GLOVE BIOGEL PI IND STRL 8 (GLOVE) ×1 IMPLANT
GLOVE BIOGEL PI INDICATOR 6.5 (GLOVE) ×1
GLOVE BIOGEL PI INDICATOR 7.0 (GLOVE)
GLOVE BIOGEL PI INDICATOR 7.5 (GLOVE) ×3
GLOVE BIOGEL PI INDICATOR 8 (GLOVE) ×1
GLOVE SURG SS PI 6.5 STRL IVOR (GLOVE) ×2 IMPLANT
GLOVE SURG SS PI 7.0 STRL IVOR (GLOVE) ×1 IMPLANT
GLOVE SURG SS PI 7.5 STRL IVOR (GLOVE) ×1 IMPLANT
GOWN STRL REIN 2XL XLG LVL4 (GOWN DISPOSABLE) ×1 IMPLANT
GOWN STRL REUS W/TWL LRG LVL3 (GOWN DISPOSABLE) ×6 IMPLANT
HANDPIECE INTERPULSE COAX TIP (DISPOSABLE) ×2
HOLDER FOLEY CATH W/STRAP (MISCELLANEOUS) ×1 IMPLANT
IMMOBILIZER KNEE 20 (SOFTGOODS) ×2
IMMOBILIZER KNEE 20 THIGH 36 (SOFTGOODS) ×1 IMPLANT
KIT TURNOVER KIT A (KITS) IMPLANT
MANIFOLD NEPTUNE II (INSTRUMENTS) ×2 IMPLANT
NS IRRIG 1000ML POUR BTL (IV SOLUTION) ×2 IMPLANT
PACK TOTAL KNEE CUSTOM (KITS) ×2 IMPLANT
PADDING CAST COTTON 6X4 STRL (CAST SUPPLIES) ×5 IMPLANT
PIN STEINMAN FIXATION KNEE (PIN) ×1 IMPLANT
PIN THREADED HEADED SIGMA (PIN) ×1 IMPLANT
PROTECTOR NERVE ULNAR (MISCELLANEOUS) ×2 IMPLANT
SET HNDPC FAN SPRY TIP SCT (DISPOSABLE) ×1 IMPLANT
STRIP CLOSURE SKIN 1/2X4 (GAUZE/BANDAGES/DRESSINGS) ×4 IMPLANT
SUT MNCRL AB 4-0 PS2 18 (SUTURE) ×2 IMPLANT
SUT STRATAFIX 0 PDS 27 VIOLET (SUTURE) ×2
SUT VIC AB 2-0 CT1 27 (SUTURE) ×6
SUT VIC AB 2-0 CT1 TAPERPNT 27 (SUTURE) ×3 IMPLANT
SUTURE STRATFX 0 PDS 27 VIOLET (SUTURE) ×1 IMPLANT
TIBIA ATTUNE KNEE SYS BASE SZ6 (Knees) ×2 IMPLANT
TRAY FOLEY MTR SLVR 16FR STAT (SET/KITS/TRAYS/PACK) ×2 IMPLANT
WATER STERILE IRR 1000ML POUR (IV SOLUTION) ×4 IMPLANT
WRAP KNEE MAXI GEL POST OP (GAUZE/BANDAGES/DRESSINGS) ×2 IMPLANT
YANKAUER SUCT BULB TIP 10FT TU (MISCELLANEOUS) ×2 IMPLANT

## 2018-06-06 NOTE — Transfer of Care (Signed)
Immediate Anesthesia Transfer of Care Note  Patient: Steven Yates  Procedure(s) Performed: Procedure(s) with comments: TOTAL KNEE ARTHROPLASTY (Left) - 72min  Patient Location: PACU  Anesthesia Type:Spinal  Level of Consciousness:  sedated, patient cooperative and responds to stimulation  Airway & Oxygen Therapy:Patient Spontanous Breathing and Patient connected to face mask oxgen  Post-op Assessment:  Report given to PACU RN and Post -op Vital signs reviewed and stable  Post vital signs:  Reviewed and stable  Last Vitals:  Vitals:   06/06/18 0749 06/06/18 0754  BP: (!) 169/84 (!) 162/81  Pulse: 62 (!) 59  Resp: 15 13  Temp:    SpO2: 601% 09%    Complications: No apparent anesthesia complications

## 2018-06-06 NOTE — Evaluation (Signed)
Physical Therapy Evaluation Patient Details Name: Steven Yates MRN: [id number] DOB: December 27, 1940 Today's Date: 06/06/2018   History of Present Illness  Pt is a 78 year old male s/p L TKA with hx of back surgeries and R rotator cuff repair  Clinical Impression  Pt is s/p TKA resulting in the deficits listed below (see PT Problem List).  Pt will benefit from skilled PT to increase their independence and safety with mobility to allow discharge to the venue listed below.  Pt plans to d/c home with spouse and eager to return to previous independent lifestyle.     Follow Up Recommendations Follow surgeon's recommendation for DC plan and follow-up therapies(plan for OP PT)    Equipment Recommendations  None recommended by PT    Recommendations for Other Services       Precautions / Restrictions Precautions Precautions: Fall;Knee Required Braces or Orthoses: Knee Immobilizer - Left Knee Immobilizer - Left: Discontinue once straight leg raise with < 10 degree lag Restrictions Other Position/Activity Restrictions: WBAT      Mobility  Bed Mobility Overal bed mobility: Needs Assistance Bed Mobility: Supine to Sit     Supine to sit: Min assist     General bed mobility comments: verbal cues for technique, assist for L LE  Transfers Overall transfer level: Needs assistance Equipment used: Rolling walker (2 wheeled) Transfers: Sit to/from Stand Sit to Stand: Min assist         General transfer comment: verbal cues for UE and LE positioning  Ambulation/Gait Ambulation/Gait assistance: Min guard Gait Distance (Feet): 80 Feet Assistive device: Rolling walker (2 wheeled) Gait Pattern/deviations: Step-to pattern;Decreased stance time - left;Antalgic     General Gait Details: verbal cues for sequence, RW positioning, step length, posture  Stairs            Wheelchair Mobility    Modified Rankin (Stroke Patients Only)       Balance                                              Pertinent Vitals/Pain Pain Assessment: 0-10 Pain Score: 6  Pain Location: L knee Pain Descriptors / Indicators: Aching;Sore Pain Intervention(s): Premedicated before session;Repositioned;Monitored during session;Ice applied    Home Living Family/patient expects to be discharged to:: Private residence Living Arrangements: Spouse/significant other   Type of Home: House Home Access: Stairs to enter Entrance Stairs-Rails: Right Entrance Stairs-Number of Steps: 1 Home Layout: One level Home Equipment: Toilet riser;Walker - 2 wheels;Cane - single point      Prior Function Level of Independence: Independent               Hand Dominance        Extremity/Trunk Assessment        Lower Extremity Assessment Lower Extremity Assessment: LLE deficits/detail LLE Deficits / Details: unable to perform SLR, ROM TBA       Communication   Communication: No difficulties  Cognition Arousal/Alertness: Awake/alert Behavior During Therapy: WFL for tasks assessed/performed Overall Cognitive Status: Within Functional Limits for tasks assessed                                        General Comments      Exercises     Assessment/Plan    PT  Assessment Patient needs continued PT services  PT Problem List Decreased strength;Decreased mobility;Decreased range of motion;Decreased knowledge of use of DME;Pain;Decreased knowledge of precautions       PT Treatment Interventions Stair training;Therapeutic exercise;Gait training;Therapeutic activities;DME instruction;Functional mobility training;Balance training;Patient/family education    PT Goals (Current goals can be found in the Care Plan section)  Acute Rehab PT Goals PT Goal Formulation: With patient Time For Goal Achievement: 06/11/18 Potential to Achieve Goals: Good    Frequency 7X/week   Barriers to discharge        Co-evaluation               AM-PAC PT "6  Clicks" Mobility  Outcome Measure Help needed turning from your back to your side while in a flat bed without using bedrails?: None Help needed moving from lying on your back to sitting on the side of a flat bed without using bedrails?: A Little Help needed moving to and from a bed to a chair (including a wheelchair)?: A Little Help needed standing up from a chair using your arms (e.g., wheelchair or bedside chair)?: A Little Help needed to walk in hospital room?: A Little Help needed climbing 3-5 steps with a railing? : A Lot 6 Click Score: 18    End of Session Equipment Utilized During Treatment: Gait belt;Left knee immobilizer Activity Tolerance: Patient tolerated treatment well Patient left: with call bell/phone within reach;with chair alarm set;in chair Nurse Communication: Mobility status PT Visit Diagnosis: Other abnormalities of gait and mobility (R26.89)    Time: 5929-2446 PT Time Calculation (min) (ACUTE ONLY): 14 min   Charges:   PT Evaluation $PT Eval Low Complexity: [place], PT, DPT Acute Rehabilitation Services Office: [phone] Pager: [place] E 06/06/2018, 5:06 PM

## 2018-06-06 NOTE — Discharge Instructions (Addendum)
Information on my medicine - XARELTO (Rivaroxaban)  This medication education was reviewed with me or my healthcare representative as part of my discharge preparation.   Why was Xarelto prescribed for you? Xarelto was prescribed for you to reduce the risk of blood clots forming after orthopedic surgery. The medical term for these abnormal blood clots is venous thromboembolism (VTE).  What do you need to know about xarelto ? Take your Xarelto ONCE DAILY at the same time every day. You may take it either with or without food.  If you have difficulty swallowing the tablet whole, you may crush it and mix in applesauce just prior to taking your dose.  Take Xarelto exactly as prescribed by your doctor and DO NOT stop taking Xarelto without talking to the doctor who prescribed the medication.  Stopping without other VTE prevention medication to take the place of Xarelto may increase your risk of developing a clot.  After discharge, you should have regular check-up appointments with your healthcare provider that is prescribing your Xarelto.    What do you do if you miss a dose? If you miss a dose, take it as soon as you remember on the same day then continue your regularly scheduled once daily regimen the next day. Do not take two doses of Xarelto on the same day.   Important Safety Information A possible side effect of Xarelto is bleeding. You should call your healthcare provider right away if you experience any of the following: ? Bleeding from an injury or your nose that does not stop. ? Unusual colored urine (red or dark brown) or unusual colored stools (red or black). ? Unusual bruising for unknown reasons. ? A serious fall or if you hit your head (even if there is no bleeding).  Some medicines may interact with Xarelto and might increase your risk of bleeding while on Xarelto. To help avoid this, consult your healthcare provider or pharmacist prior to using any new prescription  or non-prescription medications, including herbals, vitamins, non-steroidal anti-inflammatory drugs (NSAIDs) and supplements.  This website has more information on Xarelto: [email or web address]     Dr. Gaynelle Arabian [facility] Special[facility] [address] [place], [place] [address] [phone]  TOTAL KNEE REPLACEMENT POSTOPERATIVE DIRECTIONS  Knee Rehabilitation, Guidelines Following Surgery  Results after knee surgery are often greatly improved when you follow the exercise, range of motion and muscle strengthening exercises prescribed by your doctor. Safety measures are also important to protect the knee from further injury. Any time any of these exercises cause you to have increased pain or swelling in your knee joint, decrease the amount until you are comfortable again and slowly increase them. If you have problems or questions, call your caregiver or physical therapist for advice.   HOME CARE INSTRUCTIONS   Remove items at home which could result in a fall. This includes throw rugs or furniture in walking pathways.   ICE to the affected knee every three hours for 30 minutes at a time and then as needed for pain and swelling.  Continue to use ice on the knee for pain and swelling from surgery. You may notice swelling that will progress down to the foot and ankle.  This is normal after surgery.  Elevate the leg when you are not up walking on it.    Continue to use the breathing machine which will help keep your temperature down.  It is common for your temperature to cycle up and down following surgery, especially  at night when you are not up moving around and exerting yourself.  The breathing machine keeps your lungs expanded and your temperature down.  Do not place pillow under knee, focus on keeping the knee straight while resting  DIET You may resume your previous home diet once your are discharged from the hospital.  DRESSING / WOUND CARE / SHOWERING You may  change your dressing 3-5 days after surgery.  Then change the dressing every day with sterile gauze.  Please use good hand washing techniques before changing the dressing.  Do not use any lotions or creams on the incision until instructed by your surgeon. You may start showering once you are discharged home but do not submerge the incision under water. Just pat the incision dry and apply a dry gauze dressing on daily. Change the surgical dressing daily and reapply a dry dressing each time.  ACTIVITY Walk with your walker as instructed. Use walker as long as suggested by your caregivers. Avoid periods of inactivity such as sitting longer than an hour when not asleep. This helps prevent blood clots.  You may resume a sexual relationship in one month or when given the OK by your doctor.  You may return to work once you are cleared by your doctor.  Do not drive a car for 6 weeks or until released by you surgeon.  Do not drive while taking narcotics.  WEIGHT BEARING Weight bearing as tolerated with assist device (walker, cane, etc) as directed, use it as long as suggested by your surgeon or therapist, typically at least 4-6 weeks.  POSTOPERATIVE CONSTIPATION PROTOCOL Constipation - defined medically as fewer than three stools per week and severe constipation as less than one stool per week.  One of the most common issues patients have following surgery is constipation.  Even if you have a regular bowel pattern at home, your normal regimen is likely to be disrupted due to multiple reasons following surgery.  Combination of anesthesia, postoperative narcotics, change in appetite and fluid intake all can affect your bowels.  In order to avoid complications following surgery, here are some recommendations in order to help you during your recovery period.  Colace (docusate) - Pick up an over-the-counter form of Colace or another stool softener and take twice a day as long as you are requiring  postoperative pain medications.  Take with a full glass of water daily.  If you experience loose stools or diarrhea, hold the colace until you stool forms back up.  If your symptoms do not get better within 1 week or if they get worse, check with your doctor.  Dulcolax (bisacodyl) - Pick up over-the-counter and take as directed by the product packaging as needed to assist with the movement of your bowels.  Take with a full glass of water.  Use this product as needed if not relieved by Colace only.   MiraLax (polyethylene glycol) - Pick up over-the-counter to have on hand.  MiraLax is a solution that will increase the amount of water in your bowels to assist with bowel movements.  Take as directed and can mix with a glass of water, juice, soda, coffee, or tea.  Take if you go more than two days without a movement. Do not use MiraLax more than once per day. Call your doctor if you are still constipated or irregular after using this medication for 7 days in a row.  If you continue to have problems with postoperative constipation, please contact the office for further  assistance and recommendations.  If you experience "the worst abdominal pain ever" or develop nausea or vomiting, please contact the office immediatly for further recommendations for treatment.  ITCHING  If you experience itching with your medications, try taking only a single pain pill, or even half a pain pill at a time.  You can also use Benadryl over the counter for itching or also to help with sleep.   TED HOSE STOCKINGS Wear the elastic stockings on both legs for three weeks following surgery during the day but you may remove then at night for sleeping.  MEDICATIONS See your medication summary on the After Visit Summary that the nursing staff will review with you prior to discharge.  You may have some home medications which will be placed on hold until you complete the course of blood thinner medication.  It is important for you to  complete the blood thinner medication as prescribed by your surgeon.  Continue your approved medications as instructed at time of discharge.  PRECAUTIONS If you experience chest pain or shortness of breath - call 911 immediately for transfer to the hospital emergency department.  If you develop a fever greater that 101 F, purulent drainage from wound, increased redness or drainage from wound, foul odor from the wound/dressing, or calf pain - CONTACT YOUR SURGEON.                                                   FOLLOW-UP APPOINTMENTS Make sure you keep all of your appointments after your operation with your surgeon and caregivers. You should call the office at the above phone number and make an appointment for approximately two weeks after the date of your surgery or on the date instructed by your surgeon outlined in the "After Visit Summary".   RANGE OF MOTION AND STRENGTHENING EXERCISES  Rehabilitation of the knee is important following a knee injury or an operation. After just a few days of immobilization, the muscles of the thigh which control the knee become weakened and shrink (atrophy). Knee exercises are designed to build up the tone and strength of the thigh muscles and to improve knee motion. Often times heat used for twenty to thirty minutes before working out will loosen up your tissues and help with improving the range of motion but do not use heat for the first two weeks following surgery. These exercises can be done on a training (exercise) mat, on the floor, on a table or on a bed. Use what ever works the best and is most comfortable for you Knee exercises include:   Leg Lifts - While your knee is still immobilized in a splint or cast, you can do straight leg raises. Lift the leg to 60 degrees, hold for 3 sec, and slowly lower the leg. Repeat 10-20 times 2-3 times daily. Perform this exercise against resistance later as your knee gets better.   Quad and Hamstring Sets - Tighten up the  muscle on the front of the thigh (Quad) and hold for 5-10 sec. Repeat this 10-20 times hourly. Hamstring sets are done by pushing the foot backward against an object and holding for 5-10 sec. Repeat as with quad sets.   Leg Slides: Lying on your back, slowly slide your foot toward your buttocks, bending your knee up off the floor (only go as far as is  comfortable). Then slowly slide your foot back down until your leg is flat on the floor again.  Angel Wings: Lying on your back spread your legs to the side as far apart as you can without causing discomfort.  A rehabilitation program following serious knee injuries can speed recovery and prevent re-injury in the future due to weakened muscles. Contact your doctor or a physical therapist for more information on knee rehabilitation.   IF YOU ARE TRANSFERRED TO A SKILLED REHAB FACILITY If the patient is transferred to a skilled rehab facility following release from the hospital, a list of the current medications will be sent to the facility for the patient to continue.  When discharged from the skilled rehab facility, please have the facility set up the patient's [place] prior to being released. Also, the skilled facility will be responsible for providing the patient with their medications at time of release from the facility to include their pain medication, the muscle relaxants, and their blood thinner medication. If the patient is still at the rehab facility at time of the two week follow up appointment, the skilled rehab facility will also need to assist the patient in arranging follow up appointment in our office and any transportation needs.  MAKE SURE YOU:   Understand these instructions.   Get help right away if you are not doing well or get worse.    Pick up stool softner and laxative for home use following surgery while on pain medications. Do not submerge incision under water. Please use good hand washing techniques  while changing dressing each day. May shower starting three days after surgery. Please use a clean towel to pat the incision dry following showers. Continue to use ice for pain and swelling after surgery. Do not use any lotions or creams on the incision until instructed by your surgeon.

## 2018-06-06 NOTE — Progress Notes (Signed)
Assisted Dr. Marcell Barlow with left, ultrasound guided, adductor canal block. Side rails up, monitors on throughout procedure. See vital signs in flow sheet. Tolerated Procedure well.

## 2018-06-06 NOTE — Anesthesia Postprocedure Evaluation (Signed)
Anesthesia Post Note  Patient: Steven Yates  Procedure(s) Performed: TOTAL KNEE ARTHROPLASTY (Left Knee)     Patient location during evaluation: PACU Anesthesia Type: Spinal Level of consciousness: awake and alert Pain management: pain level controlled Vital Signs Assessment: post-procedure vital signs reviewed and stable Respiratory status: spontaneous breathing and respiratory function stable Cardiovascular status: blood pressure returned to baseline and stable Postop Assessment: no headache, no backache, spinal receding and no apparent nausea or vomiting Anesthetic complications: no    Last Vitals:  Vitals:   06/06/18 1320 06/06/18 1423  BP: (!) 151/79 (!) 154/80  Pulse: 71 72  Resp: 16 16  Temp: 36.9 C   SpO2: 99% 98%    Last Pain:  Vitals:   06/06/18 1503  TempSrc:   PainSc: 3                  Montez Hageman

## 2018-06-06 NOTE — Interval H&P Note (Signed)
History and Physical Interval Note:  06/06/2018 6:39 AM  Steven Yates  has presented today for surgery, with the diagnosis of left knee osteoarthritis.  The various methods of treatment have been discussed with the patient and family. After consideration of risks, benefits and other options for treatment, the patient has consented to  Procedure(s) with comments: TOTAL KNEE ARTHROPLASTY (Left) - 37min as a surgical intervention.  The patient's history has been reviewed, patient examined, no change in status, stable for surgery.  I have reviewed the patient's chart and labs.  Questions were answered to the patient's satisfaction.     Pilar Plate Reeya Bound

## 2018-06-06 NOTE — Anesthesia Procedure Notes (Addendum)
Anesthesia Regional Block: Adductor canal block   Pre-Anesthetic Checklist: ,, timeout performed, Correct Patient, Correct Site, Correct Laterality, Correct Procedure, Correct Position, site marked, Risks and benefits discussed,  Surgical consent,  Pre-op evaluation,  At surgeon's request and post-op pain management  Laterality: Left and Lower  Prep: Maximum Sterile Barrier Precautions used, chloraprep       Needles:  Injection technique: Single-shot  Needle Type: Echogenic Stimulator Needle     Needle Length: 10cm      Additional Needles:   Procedures:,,,, ultrasound used (permanent image in chart),,,,  Narrative:  Start time: 06/06/2018 7:43 AM End time: 06/06/2018 7:48 AM Injection made incrementally with aspirations every 5 mL.  Performed by: Personally  Anesthesiologist: Montez Hageman, MD  Additional Notes: Risks, benefits and alternative to block explained extensively.  Patient tolerated procedure well, without complications.

## 2018-06-06 NOTE — Anesthesia Procedure Notes (Signed)
Spinal  Patient location during procedure: OR Start time: 06/06/2018 8:11 AM End time: 06/06/2018 8:15 AM Reason for block: at surgeon's request Staffing Resident/CRNA: Anne Fu, CRNA Performed: resident/CRNA  Preanesthetic Checklist Completed: patient identified, site marked, surgical consent, pre-op evaluation, timeout performed, IV checked, risks and benefits discussed and monitors and equipment checked Spinal Block Patient position: sitting Prep: DuraPrep Patient monitoring: heart rate, continuous pulse ox and blood pressure Approach: right paramedian Location: L2-3 Injection technique: single-shot Needle Needle type: Pencan  Needle gauge: 24 G Needle length: 9 cm Assessment Sensory level: T6 Additional Notes  Functioning IV was confirmed and monitors were applied. Expiration date of kit checked and confirmed. Sterile prep and drape, including hand hygiene and sterile gloves were used. The patient was positioned and the spine was prepped. The skin was anesthetized with lidocaine.  Free flow of clear CSF was obtained prior to injecting local anesthetic into the CSF X 1 attempt.  The spinal needle aspirated freely following injection.  The needle was carefully withdrawn. Patient tolerated procedure well, without complications. Loss of motor and sensory on exam post injection.

## 2018-06-06 NOTE — Op Note (Signed)
OPERATIVE REPORT-TOTAL KNEE ARTHROPLASTY   Pre-operative diagnosis- Osteoarthritis  Left knee(s)  Post-operative diagnosis- Osteoarthritis Left knee(s)  Procedure-  Left  Total Knee Arthroplasty  Surgeon- Dione Plover. Valmore Arabie, MD  Assistant- Ardeen Jourdain, PA-C   Anesthesia-  Adductor canal block and spinal  EBL-50 mL   Drains Hemovac  Tourniquet time-  Total Tourniquet Time Documented: Thigh (Left) - 33 minutes Total: Thigh (Left) - 33 minutes     Complications- None  Condition-PACU - hemodynamically stable.   Brief Clinical Note   Steven Yates is a 78 y.o. year old male with end stage OA of his left knee with progressively worsening pain and dysfunction. He has constant pain, with activity and at rest and significant functional deficits with difficulties even with ADLs. He has had extensive non-op management including analgesics, injections of cortisone and viscosupplements, and home exercise program, but remains in significant pain with significant dysfunction. Radiographs show bone on bone arthritis medial and patellofemoral. He presents now for left Total Knee Arthroplasty.    Procedure in detail---   The patient is brought into the operating room and positioned supine on the operating table. After successful administration of  Adductor canal block and spinal,   a tourniquet is placed high on the  Left thigh(s) and the lower extremity is prepped and draped in the usual sterile fashion. Time out is performed by the operating team and then the  Left lower extremity is wrapped in Esmarch, knee flexed and the tourniquet inflated to 300 mmHg.       A midline incision is made with a ten blade through the subcutaneous tissue to the level of the extensor mechanism. A fresh blade is used to make a medial parapatellar arthrotomy. Soft tissue over the proximal medial tibia is subperiosteally elevated to the joint line with a knife and into the semimembranosus bursa with a Cobb  elevator. Soft tissue over the proximal lateral tibia is elevated with attention being paid to avoiding the patellar tendon on the tibial tubercle. The patella is everted, knee flexed 90 degrees and the ACL and PCL are removed. Findings are bone on bone medial with varus deformity and large global osteophytes.        The drill is used to create a starting hole in the distal femur and the canal is thoroughly irrigated with sterile saline to remove the fatty contents. The 5 degree Left  valgus alignment guide is placed into the femoral canal and the distal femoral cutting block is pinned to remove 9 mm off the distal femur. Resection is made with an oscillating saw.      The tibia is subluxed forward and the menisci are removed. The extramedullary alignment guide is placed referencing proximally at the medial aspect of the tibial tubercle and distally along the second metatarsal axis and tibial crest. The block is pinned to remove 70mm off the more deficient medial  side. Resection is made with an oscillating saw. Size 6is the most appropriate size for the tibia and the proximal tibia is prepared with the modular drill and keel punch for that size.      The femoral sizing guide is placed and size 7 is most appropriate. Rotation is marked off the epicondylar axis and confirmed by creating a rectangular flexion gap at 90 degrees. The size 7 cutting block is pinned in this rotation and the anterior, posterior and chamfer cuts are made with the oscillating saw. The intercondylar block is then placed and that cut is  made.      Trial size 6 tibial component, trial size 7 posterior stabilized femur and a 6  mm posterior stabilized rotating platform insert trial is placed. Full extension is achieved with excellent varus/valgus and anterior/posterior balance throughout full range of motion. The patella is everted and thickness measured to be 27  mm. Free hand resection is taken to 15 mm, a 41 template is placed, lug holes  are drilled, trial patella is placed, and it tracks normally. Osteophytes are removed off the posterior femur with the trial in place. All trials are removed and the cut bone surfaces prepared with pulsatile lavage. Cement is mixed and once ready for implantation, the size 6 tibial implant, size  7 posterior stabilized femoral component, and the size 41 patella are cemented in place and the patella is held with the clamp. The trial insert is placed and the knee held in full extension. The Exparel (20 ml mixed with 60 ml saline) is injected into the extensor mechanism, posterior capsule, medial and lateral gutters and subcutaneous tissues.  All extruded cement is removed and once the cement is hard the permanent 6 mm posterior stabilized rotating platform insert is placed into the tibial tray.      The wound is copiously irrigated with saline solution and the extensor mechanism closed over a hemovac drain with #1 V-loc suture. The tourniquet is released for a total tourniquet time of 33  minutes. Flexion against gravity is 140 degrees and the patella tracks normally. Subcutaneous tissue is closed with 2.0 vicryl and subcuticular with running 4.0 Monocryl. The incision is cleaned and dried and steri-strips and a bulky sterile dressing are applied. The limb is placed into a knee immobilizer and the patient is awakened and transported to recovery in stable condition.      Please note that a surgical assistant was a medical necessity for this procedure in order to perform it in a safe and expeditious manner. Surgical assistant was necessary to retract the ligaments and vital neurovascular structures to prevent injury to them and also necessary for proper positioning of the limb to allow for anatomic placement of the prosthesis.   Dione Plover Domonique Cothran, MD    06/06/2018, 9:13 AM

## 2018-06-07 LAB — BASIC METABOLIC PANEL
ANION GAP: 7 (ref 5–15)
BUN: 17 mg/dL (ref 8–23)
CO2: 25 mmol/L (ref 22–32)
Calcium: 8.3 mg/dL — ABNORMAL LOW (ref 8.9–10.3)
Chloride: 100 mmol/L (ref 98–111)
Creatinine, Ser: 1.08 mg/dL (ref 0.61–1.24)
GFR calc Af Amer: >60 mL/min (ref >60)
GFR calc non Af Amer: >60 mL/min (ref >60)
Glucose, Bld: 167 mg/dL — ABNORMAL HIGH (ref 70–99)
Potassium: 4.4 mmol/L (ref 3.5–5.1)
SODIUM: 132 mmol/L — AB (ref 135–145)

## 2018-06-07 LAB — GLUCOSE, CAPILLARY: Glucose-Capillary: 129 mg/dL — ABNORMAL HIGH (ref 70–99)

## 2018-06-07 LAB — CBC
HCT: 31.6 % — ABNORMAL LOW (ref 39.0–52.0)
Hemoglobin: 10.6 g/dL — ABNORMAL LOW (ref 13.0–17.0)
MCH: 33 pg (ref 26.0–34.0)
MCHC: 33.5 g/dL (ref 30.0–36.0)
MCV: 98.4 fL (ref 80.0–100.0)
Platelets: 138 10*3/uL — ABNORMAL LOW (ref 150–400)
RBC: 3.21 MIL/uL — ABNORMAL LOW (ref 4.22–5.81)
RDW: 13 % (ref 11.5–15.5)
WBC: 15.6 10*3/uL — ABNORMAL HIGH (ref 4.0–10.5)
nRBC: 0 % (ref 0.0–0.2)

## 2018-06-07 MED ORDER — OXYCODONE HCL 5 MG PO TABS: 5.0000 mg | ORAL_TABLET | Freq: Four times a day (QID) | ORAL | 0 refills | Status: DC | PRN

## 2018-06-07 MED ORDER — RIVAROXABAN 10 MG PO TABS: 10.0000 mg | ORAL_TABLET | Freq: Every day | ORAL | 0 refills | Status: DC

## 2018-06-07 MED ORDER — TRAMADOL HCL 50 MG PO TABS: 50.0000 mg | ORAL_TABLET | Freq: Four times a day (QID) | ORAL | 0 refills | Status: DC | PRN

## 2018-06-07 MED ORDER — METHOCARBAMOL 500 MG PO TABS: 500.0000 mg | ORAL_TABLET | Freq: Four times a day (QID) | ORAL | 0 refills | Status: DC | PRN

## 2018-06-07 MED ORDER — GABAPENTIN 300 MG PO CAPS: 300.0000 mg | ORAL_CAPSULE | Freq: Three times a day (TID) | ORAL | 0 refills | Status: DC

## 2018-06-07 NOTE — Progress Notes (Signed)
   Subjective: 1 Day Post-Op Procedure(s) (LRB): TOTAL KNEE ARTHROPLASTY (Left) Patient reports pain as mild.   Patient seen in rounds by Dr. Wynelle Link. Patient is well, and has had no acute complaints or problems other than pain in the left knee. No issues overnight. Foley catheter removed this AM. Denies chest pain, SOB, or calf pain. We will continue therapy today.   Objective: Vital signs in last 24 hours: Temp:  [97.4 F (36.3 C)-98.4 F (36.9 C)] 98.1 F (36.7 C) (03/10 0448) Pulse Rate:  [58-87] 65 (03/10 0448) Resp:  [11-18] 16 (03/10 0448) BP: (123-174)/(54-92) 124/75 (03/10 0448) SpO2:  [97 %-100 %] 100 % (03/10 0448)  Intake/Output from previous day:  Intake/Output Summary (Last 24 hours) at 06/07/2018 0710 Last data filed at 06/07/2018 7591 Gross per 24 hour  Intake 3404.27 ml  Output 1855 ml  Net 1549.27 ml    Labs: Recent Labs    06/07/18 0336  HGB 10.6*   Recent Labs    06/07/18 0336  WBC 15.6*  RBC 3.21*  HCT 31.6*  PLT 138*   Recent Labs    06/07/18 0336  NA 132*  K 4.4  CL 100  CO2 25  BUN 17  CREATININE 1.08  GLUCOSE 167*  CALCIUM 8.3*   Exam: General - Patient is Alert and Oriented Extremity - Neurologically intact Neurovascular intact Sensation intact distally Dorsiflexion/Plantar flexion intact Dressing - dressing C/D/I Motor Function - intact, moving foot and toes well on exam.   Past Medical History:  Diagnosis Date  . Arthritis   . Coronary artery disease    03-2014 2 stents  . Diabetes mellitus without complication ([place])    type 2  . Gastroesophageal reflux 01/19/2014  . Gout   . HTN (hypertension)   . Hypercholesterolemia   . 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   Weight as of this encounter: 94.4 kg. Advance diet Up with therapy D/C IV fluids  Anticipated LOS equal to or greater than 2 midnights due to - Age 76 and older with one or more of the following:  - Obesity  - Expected need for hospital services (PT, OT, Nursing) required for safe  discharge  - Anticipated need for postoperative skilled nursing care or inpatient rehab  - Active co-morbidities: Diabetes and Coronary Artery Disease OR   - Unanticipated findings during/Post Surgery: None  - Patient is a high risk of re-admission due to: None    DVT Prophylaxis - Xarelto Weight bearing as tolerated. D/C O2 and pulse ox and try on room air. Hemovac pulled without difficulty, will continue therapy today.  Plan is to go Home after hospital stay. Plan for discharge later today if progresses with therapy and meeting his goals. Scheduled for outpatient physical therapy. Follow-up in the office in 2 weeks.   Theresa Duty, PA-C Orthopedic Surgery 06/07/2018, 7:10 AM

## 2018-06-07 NOTE — Progress Notes (Signed)
Physical Therapy Treatment Patient Details Name: Steven Yates MRN: [id number] DOB: 11-29-1940 Today's Date: 06/07/2018    History of Present Illness Pt is a 78 year old male s/p L TKA with hx of back surgeries and R rotator cuff repair    PT Comments    Pt ambulated in hallway and performed LE exercises.  Will return to practice steps prior to d/c.   Follow Up Recommendations  Follow surgeon's recommendation for DC plan and follow-up therapies     Equipment Recommendations  None recommended by PT    Recommendations for Other Services       Precautions / Restrictions Precautions Precautions: Fall;Knee Required Braces or Orthoses: Knee Immobilizer - Left Knee Immobilizer - Left: Discontinue once straight leg raise with < 10 degree lag Restrictions Other Position/Activity Restrictions: WBAT    Mobility  Bed Mobility Overal bed mobility: Needs Assistance Bed Mobility: Supine to Sit     Supine to sit: Supervision     General bed mobility comments: verbal cues for technique, pt self assisted L LE  Transfers Overall transfer level: Needs assistance Equipment used: Rolling walker (2 wheeled) Transfers: Sit to/from Stand Sit to Stand: Min guard         General transfer comment: verbal cues for UE and LE positioning  Ambulation/Gait Ambulation/Gait assistance: Min guard Gait Distance (Feet): 160 Feet Assistive device: Rolling walker (2 wheeled) Gait Pattern/deviations: Step-to pattern;Decreased stance time - left;Antalgic     General Gait Details: verbal cues for sequence, RW positioning, step length, posture   Stairs             Wheelchair Mobility    Modified Rankin (Stroke Patients Only)       Balance                                            Cognition Arousal/Alertness: Awake/alert Behavior During Therapy: WFL for tasks assessed/performed Overall Cognitive Status: Within Functional Limits for tasks assessed                                         Exercises Total Joint Exercises Ankle Circles/Pumps: AROM;10 reps;Both Quad Sets: AROM;10 reps;Both Short Arc Quad: AROM;10 reps;Left Heel Slides: 10 reps;AAROM;Left Hip ABduction/ADduction: AAROM;10 reps;Left Straight Leg Raises: AAROM;10 reps;Left Goniometric ROM: AAROM L knee -8-80*    General Comments        Pertinent Vitals/Pain Pain Assessment: 0-10 Pain Score: 3  Pain Location: L knee Pain Descriptors / Indicators: Aching;Sore Pain Intervention(s): Premedicated before session;Repositioned;Monitored during session;Limited activity within patient's tolerance    Home Living                      Prior Function            PT Goals (current goals can now be found in the care plan section) Progress towards PT goals: Progressing toward goals    Frequency    7X/week      PT Plan Current plan remains appropriate    Co-evaluation              AM-PAC PT "6 Clicks" Mobility   Outcome Measure  Help needed turning from your back to your side while in a flat bed without using bedrails?: None Help needed moving  from lying on your back to sitting on the side of a flat bed without using bedrails?: A Little Help needed moving to and from a bed to a chair (including a wheelchair)?: A Little Help needed standing up from a chair using your arms (e.g., wheelchair or bedside chair)?: A Little Help needed to walk in hospital room?: A Little Help needed climbing 3-5 steps with a railing? : A Lot 6 Click Score: 18    End of Session Equipment Utilized During Treatment: Gait belt;Left knee immobilizer Activity Tolerance: Patient tolerated treatment well Patient left: with call bell/phone within reach;in chair   PT Visit Diagnosis: Other abnormalities of gait and mobility (R26.89)     Time: 9629-5284 PT Time Calculation (min) (ACUTE ONLY): 23 min  Charges:  $Gait Training: 8-22 mins $Therapeutic Exercise: 8-22  mins                    Carmelia Bake, PT, DPT Acute Rehabilitation Services Office: [phone] Pager: [place] E 06/07/2018, 12:03 PM

## 2018-06-07 NOTE — Plan of Care (Signed)
  Problem: Health Behavior/Discharge Planning: Goal: Ability to manage health-related needs will improve Outcome: Progressing   Problem: Clinical Measurements: Goal: Will remain free from infection Outcome: Progressing Goal: Respiratory complications will improve Outcome: Progressing   Problem: Pain Managment: Goal: General experience of comfort will improve Outcome: Progressing   Problem: Safety: Goal: Ability to remain free from injury will improve Outcome: Progressing   Problem: Education: Goal: Knowledge of the prescribed therapeutic regimen will improve Outcome: Progressing   Problem: Activity: Goal: Ability to avoid complications of mobility impairment will improve Outcome: Progressing

## 2018-06-07 NOTE — Care Management (Signed)
Per pt conversation OPPT appointment scheduled and he has all DME. Marney Doctor RN,BSN [phone]

## 2018-06-07 NOTE — Progress Notes (Signed)
Physical Therapy Treatment Patient Details Name: Steven Yates MRN: [id number] DOB: 01-Nov-1940 Today's Date: 06/07/2018    History of Present Illness Pt is a 78 year old male s/p L TKA with hx of back surgeries and R rotator cuff repair    PT Comments    Pt reports more pain however agreeable to practice steps (eager to d/c).  Pt ambulated to steps and performed min/guard for safety.  Pt with more difficulty this session due to increased pain however no physical assist required.  Ice packs applied end of session.  Pt had no further questions and provided with HEP handout.  Pt reports son will be present to assist with steps for safety upon d/c today.    Follow Up Recommendations  Follow surgeon's recommendation for DC plan and follow-up therapies     Equipment Recommendations  None recommended by PT    Recommendations for Other Services       Precautions / Restrictions Precautions Precautions: Fall;Knee Required Braces or Orthoses: Knee Immobilizer - Left Knee Immobilizer - Left: Discontinue once straight leg raise with < 10 degree lag Restrictions Other Position/Activity Restrictions: WBAT    Mobility  Bed Mobility Overal bed mobility: Needs Assistance Bed Mobility: Sit to Supine     Supine to sit: Supervision Sit to supine: Min guard   General bed mobility comments: verbal cues for technique, pt self assisted L LE  Transfers Overall transfer level: Needs assistance Equipment used: Rolling walker (2 wheeled) Transfers: Sit to/from Stand Sit to Stand: Min guard         General transfer comment: verbal cues for UE and LE positioning  Ambulation/Gait Ambulation/Gait assistance: Min guard Gait Distance (Feet): 160 Feet Assistive device: Rolling walker (2 wheeled) Gait Pattern/deviations: Decreased stance time - left;Antalgic;Step-through pattern     General Gait Details: verbal cues for sequence, RW positioning, step length, posture   Stairs Stairs:  Yes Stairs assistance: Min guard Stair Management: Step to pattern;Forwards;One rail Right;With cane Number of Stairs: 3 General stair comments: demonstrated technique for pt, verbal cues for sequence, SPC positioning, safety; pt with difficulty performing due to pain however did not require physical assist, pt reports his son can assist him upon d/c with steps   Wheelchair Mobility    Modified Rankin (Stroke Patients Only)       Balance                                            Cognition Arousal/Alertness: Awake/alert Behavior During Therapy: WFL for tasks assessed/performed Overall Cognitive Status: Within Functional Limits for tasks assessed                                        Exercises     General Comments        Pertinent Vitals/Pain Pain Assessment: 0-10 Pain Score: 6  Pain Location: L knee Pain Descriptors / Indicators: Sore Pain Intervention(s): Monitored during session;Repositioned;Patient requesting pain meds-RN notified;Limited activity within patient's tolerance;Ice applied    Home Living                      Prior Function            PT Goals (current goals can now be found in the care plan section)  Progress towards PT goals: Progressing toward goals    Frequency    7X/week      PT Plan Current plan remains appropriate    Co-evaluation              AM-PAC PT "6 Clicks" Mobility   Outcome Measure  Help needed turning from your back to your side while in a flat bed without using bedrails?: None Help needed moving from lying on your back to sitting on the side of a flat bed without using bedrails?: A Little Help needed moving to and from a bed to a chair (including a wheelchair)?: A Little Help needed standing up from a chair using your arms (e.g., wheelchair or bedside chair)?: A Little Help needed to walk in hospital room?: A Little Help needed climbing 3-5 steps with a railing? : A  Little 6 Click Score: 19    End of Session Equipment Utilized During Treatment: Gait belt;Left knee immobilizer Activity Tolerance: Patient tolerated treatment well Patient left: with call bell/phone within reach;in bed Nurse Communication: Mobility status PT Visit Diagnosis: Other abnormalities of gait and mobility (R26.89)     Time: 1100-1120 PT Time Calculation (min) (ACUTE ONLY): 20 min  Charges:  $Gait Training: 8-22 mins                     Carmelia Bake, PT, DPT Acute Rehabilitation Services Office: [phone] Pager: [phone]  Trena Platt 06/07/2018, 12:11 PM

## 2018-06-08 ENCOUNTER — Encounter (HOSPITAL_COMMUNITY): Payer: Self-pay | Admitting: Orthopedic Surgery

## 2018-06-08 NOTE — Discharge Summary (Signed)
Physician Discharge Summary   Patient ID: Steven Yates MRN: [id number] DOB/AGE: 29-Aug-1940 78 y.o.  Admit date: 06/06/2018 Discharge date: 06/07/2018  Primary Diagnosis: Osteoarthritis, left knee   Admission Diagnoses:  Past Medical History:  Diagnosis Date  . Arthritis   . Coronary artery disease    03-2014 2 stents  . Diabetes mellitus without complication ([place])    type 2  . Gastroesophageal reflux 01/19/2014  . Gout   . HTN (hypertension)   . Hypercholesterolemia   . 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   Weight as of this encounter: 94.4 kg.  Procedure:  Procedure(s) (LRB): TOTAL KNEE ARTHROPLASTY (Left)   Consults: None  HPI: Steven Yates is a 78 y.o. year old male with end stage OA of his left knee with progressively worsening pain and dysfunction. He has constant pain, with activity and at rest and significant functional deficits with difficulties even with ADLs. He has had extensive non-op management including analgesics, injections of cortisone and viscosupplements, and home exercise program, but remains in significant pain with significant dysfunction. Radiographs show bone on bone arthritis medial and patellofemoral. He presents now for left Total Knee Arthroplasty.   Laboratory Data: Admission on 06/06/2018, Discharged on 06/07/2018  Component Date Value Ref Range Status  . Glucose-Capillary 06/06/2018 87  70 - 99 mg/dL Final  . Comment 1 06/06/2018 Notify RN   Final  . Comment 2 06/06/2018 Document in Chart   Final  . Glucose-Capillary 06/06/2018 251* 70 - 99 mg/dL Final  . Glucose-Capillary 06/06/2018 160* 70 - 99 mg/dL Final  . WBC 06/07/2018 15.6* 4.0 - 10.5 K/uL Final  . RBC 06/07/2018 3.21* 4.22 - 5.81  MIL/uL Final  . Hemoglobin 06/07/2018 10.6* 13.0 - 17.0 g/dL Final  . HCT 06/07/2018 31.6* 39.0 - 52.0 % Final  . MCV 06/07/2018 98.4  80.0 - 100.0 fL Final  . MCH 06/07/2018 33.0  26.0 - 34.0 pg Final  . MCHC 06/07/2018 33.5  30.0 - 36.0 g/dL Final  . RDW 06/07/2018 13.0  11.5 - 15.5 % Final  . Platelets 06/07/2018 138* 150 - 400 K/uL Final  . nRBC 06/07/2018 0.0  0.0 - 0.2 % Final   Performed at [facility], [place] [address] [place], [place] [address]  . Sodium 06/07/2018 132* 135 - 145 mmol/L Final  . Potassium 06/07/2018 4.4  3.5 - 5.1 mmol/L Final  . Chloride 06/07/2018 100  98 - 111 mmol/L Final  . CO2 06/07/2018 25  22 - 32 mmol/L Final  . Glucose, Bld 06/07/2018 167* 70 - 99 mg/dL Final  . BUN 06/07/2018 17  8 - 23 mg/dL Final  . Creatinine, Ser 06/07/2018 1.08  0.61 - 1.24 mg/dL Final  . Calcium 06/07/2018 8.3* 8.9 - 10.3 mg/dL Final  . GFR calc non Af Amer 06/07/2018 >60  >60 mL/min Final  . GFR calc Af Amer 06/07/2018 >60  >60 mL/min Final  . Anion gap 06/07/2018 7  5 - 15 Final   Performed at [facility], [place] [address] [place], [place] [address]  . Glucose-Capillary 06/06/2018 271* 70 - 99 mg/dL Final  . Glucose-Capillary 06/06/2018 191* 70 - 99 mg/dL Final  . Glucose-Capillary 06/07/2018 129* 70 -  99 mg/dL Final  Hospital Outpatient Visit on 05/30/2018  Component Date Value Ref Range Status  . aPTT 05/30/2018 32  24 - 36 seconds Final   Performed at [facility], [place] [address] [place], [place] [address]  . WBC 05/30/2018 12.1* 4.0 - 10.5 K/uL Final  . RBC 05/30/2018 4.70  4.22 - 5.81 MIL/uL Final  . Hemoglobin 05/30/2018 15.2  13.0 - 17.0 g/dL Final  . HCT 05/30/2018 45.7  39.0 - 52.0 % Final  . MCV 05/30/2018 97.2  80.0 - 100.0 fL Final  . MCH 05/30/2018 32.3  26.0 - 34.0 pg Final  . MCHC 05/30/2018 33.3  30.0 - 36.0 g/dL Final  . RDW 05/30/2018 13.2  11.5 - 15.5 % Final  . Platelets 05/30/2018 177  150 -  400 K/uL Final  . nRBC 05/30/2018 0.0  0.0 - 0.2 % Final   Performed at [facility], [place] [address], [place], [place] [address]  . Sodium 05/30/2018 133* 135 - 145 mmol/L Final  . Potassium 05/30/2018 4.1  3.5 - 5.1 mmol/L Final  . Chloride 05/30/2018 98  98 - 111 mmol/L Final  . CO2 05/30/2018 26  22 - 32 mmol/L Final  . Glucose, Bld 05/30/2018 96  70 - 99 mg/dL Final  . BUN 05/30/2018 19  8 - 23 mg/dL Final  . Creatinine, Ser 05/30/2018 1.00  0.61 - 1.24 mg/dL Final  . Calcium 05/30/2018 9.8  8.9 - 10.3 mg/dL Final  . Total Protein 05/30/2018 7.9  6.5 - 8.1 g/dL Final  . Albumin 05/30/2018 5.0  3.5 - 5.0 g/dL Final  . AST 05/30/2018 20  15 - 41 U/L Final  . ALT 05/30/2018 23  0 - 44 U/L Final  . Alkaline Phosphatase 05/30/2018 61  38 - 126 U/L Final  . Total Bilirubin 05/30/2018 0.8  0.3 - 1.2 mg/dL Final  . GFR calc non Af Amer 05/30/2018 >60  >60 mL/min Final  . GFR calc Af Amer 05/30/2018 >60  >60 mL/min Final  . Anion gap 05/30/2018 9  5 - 15 Final   Performed at [facility], [place] [address] [place], [place] [address]  . Prothrombin Time 05/30/2018 12.6  11.4 - 15.2 seconds Final  . INR 05/30/2018 1.0  0.8 - 1.2 Final   Comment: (NOTE) INR goal varies based on device and disease states. Performed at [facility], [place] [address] [place], [place] [address]   . ABO/RH(D) 05/30/2018 O NEG   Final  . Antibody Screen 05/30/2018 NEG   Final  . Sample Expiration 05/30/2018 06/09/2018   Final  . Extend sample reason 05/30/2018    Final                   Value:NO TRANSFUSIONS OR PREGNANCY IN THE PAST 3 MONTHS Performed at [facility], [place] [address] [place], [place] [address]   . Hgb A1c MFr Bld 05/30/2018 5.7* 4.8 - 5.6 % Final   Comment: (NOTE) Pre diabetes:          5.7%-6.4% Diabetes:              >6.4% Glycemic control for   <7.0% adults with diabetes   . Mean Plasma Glucose 05/30/2018 116.89   mg/dL Final   Performed at [place] [address], [place], [place] [address]  . Glucose-Capillary 05/30/2018 113* 70 - 99 mg/dL Final  . MRSA, PCR 05/30/2018 NEGATIVE  NEGATIVE Final  . Staphylococcus aureus 05/30/2018 NEGATIVE  NEGATIVE Final   Comment: (NOTE) The Xpert SA Assay (FDA approved for NASAL specimens in patients 5 years of age and older), is one component of a comprehensive surveillance program. It is not intended to diagnose infection nor to guide or monitor treatment. Performed at [facility], [place] [address], [place], [place] [address]   . ABO/RH(D) 05/30/2018    Final                   Value:O NEG Performed at [facility], [place] [address], [place], [place] [address]      X-Rays:No results found.  EKG: Orders placed or performed in visit on 08/13/17  . EKG 12-Lead     Hospital Course: MAKYA YURKO is a 78 y.o. who was admitted to [facility]. They were brought to the operating room on 06/06/2018 and underwent Procedure(s): TOTAL KNEE ARTHROPLASTY.  Patient tolerated the procedure well and was later transferred to the recovery room and then to the orthopaedic floor for postoperative care. They were given PO and IV analgesics for pain control following their surgery. They were given 24 hours of postoperative antibiotics of  Anti-infectives (From admission, onward)   Start     Dose/Rate Route Frequency Ordered Stop   06/06/18 1400  ceFAZolin (ANCEF) IVPB 2g/100 mL premix     2 g 200 mL/hr over 30 Minutes Intravenous Every 6 hours 06/06/18 1117 06/06/18 2025   06/06/18 0615  ceFAZolin (ANCEF) IVPB 2g/100 mL premix     2 g 200 mL/hr over 30 Minutes Intravenous On call to O.R. 06/06/18 6222 06/06/18 0818     and started on DVT prophylaxis in the form of Xarelto.   PT and OT were ordered for total joint protocol. Discharge planning consulted to help with postop disposition and equipment needs.  Patient had a  good night on the evening of surgery. They started to get up OOB with therapy on POD #0. Pt was seen during rounds and was ready to go home pending progress with therapy. Hemovac drain was pulled without difficulty. He worked with therapy on POD #1 and was meeting his goals. Pt was discharged to home later that day in stable condition.  Diet: Diabetic diet Activity: WBAT Follow-up: in 2 weeks Disposition: Home Discharged Condition: stable   Discharge Instructions    Call MD / Call 911   Complete by:  As directed    If you experience chest pain or shortness of breath, CALL 911 and be transported to the hospital emergency room.  If you develope a fever above 101 F, pus (white drainage) or increased drainage or redness at the wound, or calf pain, call your surgeon's office.   Change dressing   Complete by:  As directed    Change dressing on Wednesday, then change the dressing daily with sterile 4 x 4 inch gauze dressing and apply TED hose.   Constipation Prevention   Complete by:  As directed    Drink plenty of fluids.  Prune juice may be helpful.  You may use a stool softener, such as Colace (over the counter) 100 mg twice a day.  Use MiraLax (over the counter) for constipation as needed.   Diet - low sodium heart healthy   Complete by:  As directed    Discharge instructions   Complete by:  As directed    Dr. Gaynelle Arabian Total Joint Specialist [facility] [address] [place], [place] [address] [phone]  TOTAL KNEE REPLACEMENT POSTOPERATIVE DIRECTIONS  Knee Rehabilitation, Guidelines Following Surgery  Results after knee surgery are often greatly improved when you follow the exercise, range of motion and muscle strengthening exercises prescribed by your doctor. Safety measures are also important to protect the knee from further injury. Any time any of these exercises cause you to have increased pain or swelling in your knee joint, decrease the amount until you are  comfortable again and slowly increase them. If you have problems or questions, call your caregiver or physical therapist for advice.   HOME CARE INSTRUCTIONS  Remove items at home which could result in a fall. This includes throw rugs or furniture in walking pathways.  ICE to the affected knee every three hours for 30 minutes at a time and then as needed for pain and swelling.  Continue to use ice on the knee for pain and swelling from surgery. You may notice swelling that will progress down to the foot and ankle.  This is normal after surgery.  Elevate the leg when you are not up walking on it.   Continue to use the breathing machine which will help keep your temperature down.  It is common for your temperature to cycle up and down following surgery, especially at night when you are not up moving around and exerting yourself.  The breathing machine keeps your lungs expanded and your temperature down. Do not place pillow under knee, focus on keeping the knee straight while resting   DIET You may resume your previous home diet once your are discharged from the hospital.  DRESSING / WOUND CARE / SHOWERING You may change your dressing 3-5 days after surgery.  Then change the dressing every day with sterile gauze.  Please use good hand washing techniques before changing the dressing.  Do not use any lotions or creams on the incision until instructed by your surgeon. You may start showering once you are discharged home but do not submerge the incision under water. Just pat the incision dry and apply a dry gauze dressing on daily. Change the surgical dressing daily and reapply a dry dressing each time.  ACTIVITY Walk with your walker as instructed. Use walker as long as suggested by your caregivers. Avoid periods of inactivity such as sitting longer than an hour when not asleep. This helps prevent blood clots.  You may resume a sexual relationship in one month or when given the OK by your doctor.  You  may return to work once you are cleared by your doctor.  Do not drive a car for 6 weeks or until released by you surgeon.  Do not drive while taking narcotics.  WEIGHT BEARING Weight bearing as tolerated with assist device (walker, cane, etc) as directed, use it as long as suggested by your surgeon or therapist, typically at least 4-6 weeks.  POSTOPERATIVE CONSTIPATION PROTOCOL Constipation - defined medically as fewer than three stools per week and severe constipation as less than one stool per week.  One of the most common issues patients have following surgery is constipation.  Even if you have a regular bowel pattern at home, your normal regimen is likely to be disrupted due to multiple reasons following surgery.  Combination of anesthesia, postoperative narcotics, change in appetite and fluid intake all can affect your bowels.  In order to avoid complications following surgery, here are some recommendations in order to help you during your recovery period.  Colace (  docusate) - Pick up an over-the-counter form of Colace or another stool softener and take twice a day as long as you are requiring postoperative pain medications.  Take with a full glass of water daily.  If you experience loose stools or diarrhea, hold the colace until you stool forms back up.  If your symptoms do not get better within 1 week or if they get worse, check with your doctor.  Dulcolax (bisacodyl) - Pick up over-the-counter and take as directed by the product packaging as needed to assist with the movement of your bowels.  Take with a full glass of water.  Use this product as needed if not relieved by Colace only.   MiraLax (polyethylene glycol) - Pick up over-the-counter to have on hand.  MiraLax is a solution that will increase the amount of water in your bowels to assist with bowel movements.  Take as directed and can mix with a glass of water, juice, soda, coffee, or tea.  Take if you go more than two days without a  movement. Do not use MiraLax more than once per day. Call your doctor if you are still constipated or irregular after using this medication for 7 days in a row.  If you continue to have problems with postoperative constipation, please contact the office for further assistance and recommendations.  If you experience "the worst abdominal pain ever" or develop nausea or vomiting, please contact the office immediatly for further recommendations for treatment.  ITCHING  If you experience itching with your medications, try taking only a single pain pill, or even half a pain pill at a time.  You can also use Benadryl over the counter for itching or also to help with sleep.   TED HOSE STOCKINGS Wear the elastic stockings on both legs for three weeks following surgery during the day but you may remove then at night for sleeping.  MEDICATIONS See your medication summary on the "After Visit Summary" that the nursing staff will review with you prior to discharge.  You may have some home medications which will be placed on hold until you complete the course of blood thinner medication.  It is important for you to complete the blood thinner medication as prescribed by your surgeon.  Continue your approved medications as instructed at time of discharge.  PRECAUTIONS If you experience chest pain or shortness of breath - call 911 immediately for transfer to the hospital emergency department.  If you develop a fever greater that 101 F, purulent drainage from wound, increased redness or drainage from wound, foul odor from the wound/dressing, or calf pain - CONTACT YOUR SURGEON.                                                   FOLLOW-UP APPOINTMENTS Make sure you keep all of your appointments after your operation with your surgeon and caregivers. You should call the office at the above phone number and make an appointment for approximately two weeks after the date of your surgery or on the date instructed by your  surgeon outlined in the "After Visit Summary".   RANGE OF MOTION AND STRENGTHENING EXERCISES  Rehabilitation of the knee is important following a knee injury or an operation. After just a few days of immobilization, the muscles of the thigh which control the knee become weakened and shrink (  atrophy). Knee exercises are designed to build up the tone and strength of the thigh muscles and to improve knee motion. Often times heat used for twenty to thirty minutes before working out will loosen up your tissues and help with improving the range of motion but do not use heat for the first two weeks following surgery. These exercises can be done on a training (exercise) mat, on the floor, on a table or on a bed. Use what ever works the best and is most comfortable for you Knee exercises include:  Leg Lifts - While your knee is still immobilized in a splint or cast, you can do straight leg raises. Lift the leg to 60 degrees, hold for 3 sec, and slowly lower the leg. Repeat 10-20 times 2-3 times daily. Perform this exercise against resistance later as your knee gets better.  Quad and Hamstring Sets - Tighten up the muscle on the front of the thigh (Quad) and hold for 5-10 sec. Repeat this 10-20 times hourly. Hamstring sets are done by pushing the foot backward against an object and holding for 5-10 sec. Repeat as with quad sets.  Leg Slides: Lying on your back, slowly slide your foot toward your buttocks, bending your knee up off the floor (only go as far as is comfortable). Then slowly slide your foot back down until your leg is flat on the floor again. Angel Wings: Lying on your back spread your legs to the side as far apart as you can without causing discomfort.  A rehabilitation program following serious knee injuries can speed recovery and prevent re-injury in the future due to weakened muscles. Contact your doctor or a physical therapist for more information on knee rehabilitation.   IF YOU ARE TRANSFERRED TO  A SKILLED REHAB FACILITY If the patient is transferred to a skilled rehab facility following release from the hospital, a list of the current medications will be sent to the facility for the patient to continue.  When discharged from the skilled rehab facility, please have the facility set up the patient's [place] prior to being released. Also, the skilled facility will be responsible for providing the patient with their medications at time of release from the facility to include their pain medication, the muscle relaxants, and their blood thinner medication. If the patient is still at the rehab facility at time of the two week follow up appointment, the skilled rehab facility will also need to assist the patient in arranging follow up appointment in our office and any transportation needs.  MAKE SURE YOU:  Understand these instructions.  Get help right away if you are not doing well or get worse.    Pick up stool softner and laxative for home use following surgery while on pain medications. Do not submerge incision under water. Please use good hand washing techniques while changing dressing each day. May shower starting three days after surgery. Please use a clean towel to pat the incision dry following showers. Continue to use ice for pain and swelling after surgery. Do not use any lotions or creams on the incision until instructed by your surgeon.   Do not put a pillow under the knee. Place it under the heel.   Complete by:  As directed    Driving restrictions   Complete by:  As directed    No driving for two weeks   TED hose   Complete by:  As directed    Use stockings (TED hose) for three  weeks on both leg(s).  You may remove them at night for sleeping.   Weight bearing as tolerated   Complete by:  As directed      Allergies as of 06/07/2018   No Known Allergies     Medication List    STOP taking these medications   aspirin EC 81 MG tablet     TAKE these  medications   atorvastatin 10 MG tablet Commonly known as:  LIPITOR TAKE 1 TABLET BY MOUTH EVERY DAY What changed:  when to take this   gabapentin 300 MG capsule Commonly known as:  NEURONTIN Take 1 capsule (300 mg total) by mouth 3 (three) times daily. Take a 300 mg capsule three times a day for two weeks following surgery.Then take a 300 mg capsule two times a day for two weeks. Then take a 300 mg capsule once a day for two weeks. Then discontinue.   glimepiride 1 MG tablet Commonly known as:  AMARYL Take 1 tablet by mouth every morning.   losartan-hydrochlorothiazide 50-12.5 MG tablet Commonly known as:  HYZAAR Take 1 tablet by mouth daily.   metFORMIN 500 MG tablet Commonly known as:  GLUCOPHAGE Take 1,000 mg by mouth daily with breakfast.   methocarbamol 500 MG tablet Commonly known as:  ROBAXIN Take 1 tablet (500 mg total) by mouth every 6 (six) hours as needed for muscle spasms.   Nitrostat 0.4 MG SL tablet Generic drug:  nitroGLYCERIN Place 0.4 mg under the tongue every 5 (five) minutes as needed for chest pain.   oxyCODONE 5 MG immediate release tablet Commonly known as:  Oxy IR/ROXICODONE Take 1-2 tablets (5-10 mg total) by mouth every 6 (six) hours as needed for severe pain.   pantoprazole 40 MG tablet Commonly known as:  PROTONIX Take 1 tablet (40 mg total) by mouth daily.   rivaroxaban 10 MG Tabs tablet Commonly known as:  XARELTO Take 1 tablet (10 mg total) by mouth daily for 20 days. Then resume one 81 mg aspirin once a day.   traMADol 50 MG tablet Commonly known as:  ULTRAM Take 1-2 tablets (50-100 mg total) by mouth every 6 (six) hours as needed for moderate pain.            Discharge Care Instructions  (From admission, onward)         Start     Ordered   06/07/18 0000  Weight bearing as tolerated     06/07/18 0715   06/07/18 0000  Change dressing    Comments:  Change dressing on Wednesday, then change the dressing daily with sterile 4 x 4  inch gauze dressing and apply TED hose.   06/07/18 0715         Follow-up Information    Gaynelle Arabian, MD. Schedule an appointment as soon as possible for a visit on 06/21/2018.   Specialty:  Orthopedic Surgery Contact information: [address] [place] [address] [phone]           Signed: Theresa Duty, PA-C Orthopedic Surgery 06/08/2018, 10:42 AM

## 2018-06-10 ENCOUNTER — Encounter: Payer: Self-pay | Admitting: Physical Therapy

## 2018-06-10 ENCOUNTER — Other Ambulatory Visit: Payer: Self-pay

## 2018-06-10 ENCOUNTER — Ambulatory Visit: Payer: PPO | Attending: Orthopedic Surgery | Admitting: Physical Therapy

## 2018-06-10 DIAGNOSIS — R262 Difficulty in walking, not elsewhere classified: Secondary | ICD-10-CM | POA: Insufficient documentation

## 2018-06-10 DIAGNOSIS — M25662 Stiffness of left knee, not elsewhere classified: Secondary | ICD-10-CM

## 2018-06-10 DIAGNOSIS — M25562 Pain in left knee: Secondary | ICD-10-CM | POA: Insufficient documentation

## 2018-06-10 DIAGNOSIS — R600 Localized edema: Secondary | ICD-10-CM | POA: Diagnosis not present

## 2018-06-10 NOTE — Therapy (Signed)
[place], [place], [address] Phone: [phone]   Fax:  [phone]  Physical Therapy Evaluation  Patient Details  Name: Steven Yates MRN: [id number] Date of Birth: 1940/06/30 Referring Provider (PT): Aluisio   Encounter Date: 06/10/2018  PT End of Session - 06/10/18 1025    Visit Number  1    Date for PT Re-Evaluation  08/10/18    PT Start Time  0840    PT Stop Time  0930    PT Time Calculation (min)  50 min    Equipment Utilized During Treatment  Gait belt    Activity Tolerance  Treatment limited secondary to medical complications (Comment);Patient limited by lethargy;Patient limited by pain;Patient limited by fatigue       Past Medical History:  Diagnosis Date  . Arthritis   . Coronary artery disease    03-2014 2 stents  . Diabetes mellitus without complication ([place])    type 2  . Gastroesophageal reflux 01/19/2014  . Gout   . HTN (hypertension)   . Hypercholesterolemia   . 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 BACK SURGERY     x2  . CARDIAC CATHETERIZATION  01/2014   Dr. Tamala Julian  . CARDIAC CATHETERIZATION  04/04/2014   Procedure: CORONARY/BYPASS GRAFT CTO INTERVENTION;  Surgeon: Peter M Martinique, MD;  Location: [facility] CATH LAB;  Service: Cardiovascular;;  . CARPAL TUNNEL RELEASE     bil   2019  . CHOLECYSTECTOMY  2010  . CORONARY ANGIOPLASTY WITH STENT PLACEMENT  04/04/2014   "2"  . INGUINAL HERNIA REPAIR Bilateral 1966?  . INTRAVASCULAR PRESSURE WIRE/FFR STUDY N/A 09/01/2016   Procedure: Intravascular Pressure Wire/FFR Study;  Surgeon: Belva Crome, MD;  Location: [place] CV LAB;  Service: Cardiovascular;  Laterality: N/A;  . LEFT HEART CATH AND CORONARY ANGIOGRAPHY N/A 09/01/2016   Procedure: Left Heart Cath and Coronary Angiography;  Surgeon: Belva Crome, MD;  Location: [place] CV LAB;  Service: Cardiovascular;  Laterality: N/A;  . LEFT HEART CATHETERIZATION WITH CORONARY ANGIOGRAM N/A 01/30/2014   Procedure: LEFT HEART CATHETERIZATION WITH CORONARY ANGIOGRAM;  Surgeon: Sinclair Grooms, MD;  Location[facility] CATH LAB;  Service: Cardiovascular;  Laterality: N/A;  . LUMBAR [place] SURGERY  1980's X 2   "ruptured disc"  . PERCUTANEOUS CORONARY STENT INTERVENTION (PCI-S)  01/30/2014   Procedure: PERCUTANEOUS CORONARY STENT INTERVENTION (PCI-S);  Surgeon: Sinclair Grooms, MD;  Location: [facility] CATH LAB;  Service: Cardiovascular;;  . SHOULDER OPEN ROTATOR CUFF REPAIR Right 1990's  . TOTAL KNEE ARTHROPLASTY Left 06/06/2018   Procedure: TOTAL KNEE ARTHROPLASTY;  Surgeon: Gaynelle Arabian, MD;  Location: [facility];  Service: Orthopedics;  Laterality: Left;  5min  . ULTRASOUND GUIDANCE FOR VASCULAR ACCESS  09/01/2016   Procedure: Ultrasound Guidance For Vascular Access;  Surgeon: Belva Crome, MD;  Location: [place] CV LAB;  Service: Cardiovascular;;    There were no vitals filed for this visit.   Subjective Assessment - 06/10/18 0851    Subjective  Patient underwent a left TKR on 06/06/18.  One night in the hospital, he reports that once he got home he has not gotten out of bed.  He reports that when he tries to get up he is light headed and feels "woozy"  He reports that he has not eaten in a day.  Pertinent History  2 back surgeries, gout, CAD, RC repair    Limitations  Lifting;Standing;Walking;Sitting;House hold activities    Patient Stated Goals  walk, have less pain    Currently in Pain?  Yes    Pain Score  4     Pain Location  Knee    Pain Orientation  Left    Pain Descriptors / Indicators  Aching    Pain Type  Acute pain;Surgical pain    Pain Onset  In the past 7 days    Pain Frequency  Constant    Aggravating Factors   any motions, will increase the pain up to 8/10    Pain Relieving Factors  not moving the pain can be 2/10    Effect of Pain on Daily Activities   reports that he cannot do anything         [facility] PT Assessment - 06/10/18 0001      Assessment   Medical Diagnosis  left TKR     Referring Provider (PT)  Aluisio    Onset Date/Surgical Date  06/06/18    Prior Therapy  no      Precautions   Precautions  None      Balance Screen   Has the patient fallen in the past 6 months  No    Has the patient had a decrease in activity level because of a fear of falling?   No    Is the patient reluctant to leave their home because of a fear of falling?   No      Home Environment   Additional Comments  has steps in the home and two steps into the bedroom      Prior Function   Level of Independence  Independent    Vocation  Full time employment    Vocation Requirements  some walking, mostly computer    Leisure  no exercise      Observation/Other Assessments-Edema    Edema  Circumferential      Circumferential Edema   Circumferential - Right  42.5 cm mid patella    Circumferential - Left   48 cm      ROM / Strength   AROM / PROM / Strength  AROM;PROM;Strength      AROM   AROM Assessment Site  Knee    Right/Left Knee  Left    Left Knee Extension  50      PROM   PROM Assessment Site  Knee    Right/Left Knee  Left    Left Knee Extension  25      Strength   Overall Strength Comments  left knee 2/5      Palpation   Palpation comment  patient very tender and guarded to touch, swollen      Transfers   Comments  family reports that he needs max assist, however with good set up and cues for him to do on his own he can do with min A      Ambulation/Gait   Gait Comments  ambulate with FWW, two person assist, as he gets very dizzy, he was not able to bear weight on the left without really a lot of cues mostly due to fear, but he does seem very dizzy                Objective measurements completed on examination: See above findings.              PT Education - 06/10/18 1024  Education Details  seated marches,  LAQ, hee and toe raises and asked him to be up 30 minutes every two hours as he reports that he has been in bed for the past 3 days    Person(s) Educated  Patient    Methods  Explanation;Demonstration;Tactile cues;Verbal cues;Handout    Comprehension  Verbalized understanding;Returned demonstration;Verbal cues required       PT Short Term Goals - 06/10/18 1032      PT SHORT TERM GOAL #1   Title  independent iwth initial HEP    Time  2    Period  Weeks    Status  New        PT Long Term Goals - 06/10/18 1032      PT LONG TERM GOAL #1   Title  transfer independent    Time  12    Period  Weeks    Status  New      PT LONG TERM GOAL #2   Title  walk with SPC all distances independently    Time  12    Period  Weeks    Status  New      PT LONG TERM GOAL #3   Title  goup and down steps independently step over step    Time  12    Period  Weeks    Status  New      PT LONG TERM GOAL #4   Title  decrease pain 50%    Time  12    Period  Weeks    Status  New      PT LONG TERM GOAL #5   Title  increase AROM to 5-110 degrees flexion    Time  12    Period  Weeks    Status  New             Plan - 06/10/18 1026    Clinical Impression Statement  Patient underwent a left TKR on 06/06/18.  He reports that he has been in a lot of pain and went home from hospital and has been in the bed since, his family reports that he is max assist to stand and has not walked, reports that hecannot lift either leg.  His AROM was 50-80 degrees with c/o pain.  HE with good cues and alot of set up could stand with min A but needed close gaurding due to the dizziness, He really seems lethargic.  I was able to walk him 66' with two people due to the dizziness but again needed rest, cues and close supervision with a lot of encouragement    Personal Factors and Comorbidities  Behavior Pattern;Fitness;Comorbidity 3+    Comorbidities  gout, RC repair, CAD, 2 + back surgeries    Examination-Activity  Limitations  Stand;Bed Mobility;Locomotion Level;Toileting;Transfers;Sit;Dressing;Squat;Stairs    Examination-Participation Restrictions  Church;Medication Management;Yard Work;Cleaning;Meal Prep;Community Activity;Personal Finances;Driving;School;Interpersonal Relationship;Shop;Laundry    Stability/Clinical Decision Making  Unstable/Unpredictable    Clinical Decision Making  Moderate    Rehab Potential  Good    PT Frequency  3x / week    PT Duration  8 weeks    PT Treatment/Interventions  ADLs/Self Care Home Management;Cryotherapy;Electrical Stimulation;Therapeutic exercise;Therapeutic activities;Functional mobility training;Stair training;Gait training;Balance training;Neuromuscular re-education;Patient/family education;Manual techniques;Vasopneumatic Device;Passive range of motion    PT Next Visit Plan  start getting him moving and walking    Consulted and Agree with Plan of Care  Patient       Patient will benefit from skilled therapeutic intervention in order  to improve the following deficits and impairments:  Abnormal gait, Dizziness, Pain, Decreased scar mobility, Decreased mobility, Cardiopulmonary status limiting activity, Decreased activity tolerance, Decreased endurance, Decreased range of motion, Decreased strength, Impaired flexibility, Increased edema, Difficulty walking, Decreased balance  Visit Diagnosis: Acute pain of left knee - Plan: PT plan of care cert/re-cert  Stiffness of left knee, not elsewhere classified - Plan: PT plan of care cert/re-cert  Difficulty in walking, not elsewhere classified - Plan: PT plan of care cert/re-cert  Localized edema - Plan: PT plan of care cert/re-cert     Problem List Patient Active Problem List   Diagnosis Date Noted  . OA (osteoarthritis) of knee 06/06/2018  . Essential hypertension 09/07/2014  . Hyperlipidemia 09/07/2014  . Groin hematoma 04/06/2014  . Plantar fasciitis, bilateral 03/05/2014  . Metatarsal deformity 03/05/2014   . Abnormal nuclear stress test 01/30/2014  . Coronary artery disease involving native coronary artery of native heart with angina pectoris ([place]) 01/30/2014  . 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 Dyspnea on exertion 01/19/2014  . Gastroesophageal reflux 01/19/2014    Sumner Boast., PT 06/10/2018, 10:45 AM  [place], [place], [address] Phone: [phone]   Fax:  [phone]  Name: Steven Yates MRN: [id number] Date of Birth: 1941/03/09

## 2018-06-13 ENCOUNTER — Other Ambulatory Visit: Payer: Self-pay

## 2018-06-13 ENCOUNTER — Ambulatory Visit: Payer: PPO | Admitting: Physical Therapy

## 2018-06-13 ENCOUNTER — Encounter: Payer: Self-pay | Admitting: Physical Therapy

## 2018-06-13 DIAGNOSIS — M25562 Pain in left knee: Secondary | ICD-10-CM | POA: Diagnosis not present

## 2018-06-13 DIAGNOSIS — R262 Difficulty in walking, not elsewhere classified: Secondary | ICD-10-CM

## 2018-06-13 DIAGNOSIS — R600 Localized edema: Secondary | ICD-10-CM

## 2018-06-13 DIAGNOSIS — M25662 Stiffness of left knee, not elsewhere classified: Secondary | ICD-10-CM

## 2018-06-13 NOTE — Therapy (Signed)
[place], [place], [address] Phone: [phone]   Fax:  [phone]  Physical Therapy Treatment  Patient Details  Name: Steven Yates MRN: [id number] Date of Birth: 07/25/1940 Referring Provider (PT): Aluisio   Encounter Date: 06/13/2018  PT End of Session - 06/13/18 0833    Visit Number  2    Date for PT Re-Evaluation  08/10/18    PT Start Time  0752    PT Stop Time  0850    PT Time Calculation (min)  58 min    Activity Tolerance  Patient tolerated treatment well;Patient limited by pain;Patient limited by lethargy    Behavior During Therapy  [facility] for tasks assessed/performed       Past Medical History:  Diagnosis Date  . Arthritis   . Coronary artery disease    03-2014 2 stents  . Diabetes mellitus without complication ([place])    type 2  . Gastroesophageal reflux 01/19/2014  . Gout   . HTN (hypertension)   . Hypercholesterolemia   . 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 BACK SURGERY     x2  . CARDIAC CATHETERIZATION  01/2014   Dr. Tamala Julian  . CARDIAC CATHETERIZATION  04/04/2014   Procedure: CORONARY/BYPASS GRAFT CTO INTERVENTION;  Surgeon: Peter M Martinique, MD;  Location: [facility] CATH LAB;  Service: Cardiovascular;;  . CARPAL TUNNEL RELEASE     bil   2019  . CHOLECYSTECTOMY  2010  . CORONARY ANGIOPLASTY WITH STENT PLACEMENT  04/04/2014   "2"  . INGUINAL HERNIA REPAIR Bilateral 1966?  . INTRAVASCULAR PRESSURE WIRE/FFR STUDY N/A 09/01/2016   Procedure: Intravascular Pressure Wire/FFR Study;  Surgeon: Belva Crome, MD;  Location: [place] CV LAB;  Service: Cardiovascular;  Laterality: N/A;  . LEFT HEART CATH AND CORONARY ANGIOGRAPHY N/A 09/01/2016   Procedure: Left Heart Cath and Coronary Angiography;  Surgeon: Belva Crome, MD;  Location: [place] CV LAB;  Service: Cardiovascular;   Laterality: N/A;  . LEFT HEART CATHETERIZATION WITH CORONARY ANGIOGRAM N/A 01/30/2014   Procedure: LEFT HEART CATHETERIZATION WITH CORONARY ANGIOGRAM;  Surgeon: Sinclair Grooms, MD;  Location: [facility] CATH LAB;  Service: Cardiovascular;  Laterality: N/A;  . LUMBAR [place] SURGERY  1980's X 2   "ruptured disc"  . PERCUTANEOUS CORONARY STENT INTERVENTION (PCI-S)  01/30/2014   Procedure: PERCUTANEOUS CORONARY STENT INTERVENTION (PCI-S);  Surgeon: Sinclair Grooms, MD;  Location: [facility] CATH LAB;  Service: Cardiovascular;;  . SHOULDER OPEN ROTATOR CUFF REPAIR Right 1990's  . TOTAL KNEE ARTHROPLASTY Left 06/06/2018   Procedure: TOTAL KNEE ARTHROPLASTY;  Surgeon: Gaynelle Arabian, MD;  Location: [facility];  Service: Orthopedics;  Laterality: Left;  79mn  . ULTRASOUND GUIDANCE FOR VASCULAR ACCESS  09/01/2016   Procedure: Ultrasound Guidance For Vascular Access;  Surgeon: SBelva Crome MD;  [facility];  Service: Cardiovascular;;    There were no vitals filed for this visit.  Subjective Assessment - 06/13/18 0807    Subjective  Patient reports that the weekend was rough, pain an 8/10 all the time    Currently in Pain?  Yes    Pain Score  8     Pain Location  Knee    Pain Orientation  Left  [place] Adult PT Treatment/Exercise - 06/13/18 0001      Exercises   Exercises  Knee/Hip      Knee/Hip Exercises: Aerobic   Nustep  level 2 x 7 minutes      Knee/Hip Exercises: Seated   Long Arc Quad  Left;20 reps    Long Arc Quad Limitations  some assist with red tband    Heel Slides  Left;20 reps    [employer]  20      Knee/Hip Exercises: Supine   Quad Sets  Left;20 reps    Short Arc [employer]  Left;20 reps    Terminal Knee Extension  Left;20 reps    Other Supine Knee/Hip Exercises  feet on ball K2C, bridges      Modalities   Modalities  Vasopneumatic      Vasopneumatic   Number Minutes Vasopneumatic   15 minutes    Vasopnuematic Location   Knee     Vasopneumatic Pressure  Medium    Vasopneumatic Temperature   33      Manual Therapy   Manual Therapy  Passive ROM    Passive ROM  for flexion and extension               PT Short Term Goals - 06/13/18 0838      PT SHORT TERM GOAL #1   Status  Partially Met        PT Long Term Goals - 06/10/18 1032      PT LONG TERM GOAL #1   Title  transfer independent    Time  12    Period  Weeks    Status  New      PT LONG TERM GOAL #2   Title  walk with SPC all distances independently    Time  12    Period  Weeks    Status  New      PT LONG TERM GOAL #3   Title  goup and down steps independently step over step    Time  12    Period  Weeks    Status  New      PT LONG TERM GOAL #4   Title  decrease pain 50%    Time  12    Period  Weeks    Status  New      PT LONG TERM GOAL #5   Title  increase AROM to 5-110 degrees flexion    Time  12    Period  Weeks    Status  New            Plan - 06/13/18 3500    Clinical Impression Statement  Patient looks a lot better than on Friday, he was able to transfer with set up.  With activity he needs a lot of cues and some assist to get the leg moving, especially with any extension, he is very limited with this.    PT Next Visit Plan  start getting him moving and walking    Consulted and Agree with Plan of Care  Patient       Patient will benefit from skilled therapeutic intervention in order to improve the following deficits and impairments:  Abnormal gait, Dizziness, Pain, Decreased scar mobility, Decreased mobility, Cardiopulmonary status limiting activity, Decreased activity tolerance, Decreased endurance, Decreased range of motion, Decreased strength, Impaired flexibility, Increased edema, Difficulty walking, Decreased balance  Visit Diagnosis: Acute pain of left knee  Stiffness of left knee, not elsewhere classified  Difficulty  in walking, not elsewhere classified  Localized edema     Problem List Patient  Active Problem List   Diagnosis Date Noted  . OA (osteoarthritis) of knee 06/06/2018  . Essential hypertension 09/07/2014  . Hyperlipidemia 09/07/2014  . Groin hematoma 04/06/2014  . Plantar fasciitis, bilateral 03/05/2014  . 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 Abnormal nuclear stress test 01/30/2014  . Coronary artery disease involving native coronary artery of native heart with angina pectoris ([place]) 01/30/2014  . 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 Dyspnea on exertion 01/19/2014  . Gastroesophageal reflux 01/19/2014    Sumner Boast., PT 06/13/2018, 8:39 AM  [place] [address] [place], [place], [address] Phone: [phone]   Fax:  [phone]  Name: Steven Yates MRN: [id number] Date of Birth: 1940/11/02

## 2018-06-15 ENCOUNTER — Encounter: Payer: Self-pay | Admitting: Physical Therapy

## 2018-06-15 ENCOUNTER — Other Ambulatory Visit: Payer: Self-pay

## 2018-06-15 ENCOUNTER — Ambulatory Visit: Payer: PPO | Admitting: Physical Therapy

## 2018-06-15 DIAGNOSIS — R600 Localized edema: Secondary | ICD-10-CM

## 2018-06-15 DIAGNOSIS — M25562 Pain in left knee: Secondary | ICD-10-CM

## 2018-06-15 DIAGNOSIS — M25662 Stiffness of left knee, not elsewhere classified: Secondary | ICD-10-CM

## 2018-06-15 DIAGNOSIS — R262 Difficulty in walking, not elsewhere classified: Secondary | ICD-10-CM

## 2018-06-15 NOTE — Therapy (Signed)
[place], [place], [address] Phone: [phone]   Fax:  [phone]  Physical Therapy Treatment  Patient Details  Name: Steven Yates MRN: [id number] Date of Birth: [birth year] Referring Provider (PT): Aluisio   Encounter Date: 06/15/2018  PT End of Session - 06/15/18 0922    Visit Number  3    PT Start Time  0845    PT Stop Time  0930    PT Time Calculation (min)  45 min    Activity Tolerance  Patient tolerated treatment well;Patient limited by pain;Patient limited by lethargy    Behavior During Therapy  [facility] for tasks assessed/performed       Past Medical History:  Diagnosis Date  . Arthritis   . Coronary artery disease    03-2014 2 stents  . Diabetes mellitus without complication ([place])    type 2  . Gastroesophageal reflux 01/19/2014  . Gout   . HTN (hypertension)   . Hypercholesterolemia   . 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 BACK SURGERY     x2  . CARDIAC CATHETERIZATION  01/2014   Dr. Tamala Julian  . CARDIAC CATHETERIZATION  04/04/2014   Procedure: CORONARY/BYPASS GRAFT CTO INTERVENTION;  Surgeon: Peter M Martinique, MD;  Location: [facility] CATH LAB;  Service: Cardiovascular;;  . CARPAL TUNNEL RELEASE     bil   2019  . CHOLECYSTECTOMY  2010  . CORONARY ANGIOPLASTY WITH STENT PLACEMENT  04/04/2014   "2"  . INGUINAL HERNIA REPAIR Bilateral 1966?  . INTRAVASCULAR PRESSURE WIRE/FFR STUDY N/A 09/01/2016   Procedure: Intravascular Pressure Wire/FFR Study;  Surgeon: Belva Crome, MD;  Location: [place] CV LAB;  Service: Cardiovascular;  Laterality: N/A;  . LEFT HEART CATH AND CORONARY ANGIOGRAPHY N/A 09/01/2016   Procedure: Left Heart Cath and Coronary Angiography;  Surgeon: Belva Crome, MD;  Location: [place] CV LAB;  Service: Cardiovascular;  Laterality: N/A;  . LEFT HEART  CATHETERIZATION WITH CORONARY ANGIOGRAM N/A 01/30/2014   Procedure: LEFT HEART CATHETERIZATION WITH CORONARY ANGIOGRAM;  Surgeon: Sinclair Grooms, MD;  Location: [facility] CATH LAB;  Service: Cardiovascular;  Laterality: N/A;  . LUMBAR [place] SURGERY  1980's X 2   "ruptured disc"  . PERCUTANEOUS CORONARY STENT INTERVENTION (PCI-S)  01/30/2014   Procedure: PERCUTANEOUS CORONARY STENT INTERVENTION (PCI-S);  Surgeon: Sinclair Grooms, MD;  Location: [facility] CATH LAB;  Service: Cardiovascular;;  . SHOULDER OPEN ROTATOR CUFF REPAIR Right 1990's  . TOTAL KNEE ARTHROPLASTY Left 06/06/2018   Procedure: TOTAL KNEE ARTHROPLASTY;  Surgeon: Gaynelle Arabian, MD;  [facility];  Service: Orthopedics;  Laterality: Left;  66mn  . ULTRASOUND GUIDANCE FOR VASCULAR ACCESS  09/01/2016   Procedure: Ultrasound Guidance For Vascular Access;  Surgeon: SBelva Crome MD;  [facility];  Service: Cardiovascular;;    There were no vitals filed for this visit.  Subjective Assessment - 06/15/18 0854    Subjective  Pt reports that he has been dizzy and lightheaded. he did bring cheese to eat, after eating he stated that's he felt better    Currently in Pain?  Yes    Pain Score  8     Pain Location  Knee    Pain Orientation  Left    Pain Descriptors / Indicators  Tightness  [place] Adult PT Treatment/Exercise - 06/15/18 0001      Knee/Hip Exercises: Aerobic   Nustep  level 2 x 7 minutes      Knee/Hip Exercises: Seated   Heel Slides  Left;20 reps    Other Seated Knee/Hip Exercises  Quad sets x10       Vasopneumatic   Number Minutes Vasopneumatic   15 minutes    Vasopnuematic Location   Knee    Vasopneumatic Pressure  Medium    Vasopneumatic Temperature   33      Manual Therapy   Manual Therapy  Passive ROM    Passive ROM  for flexion and extension               PT Short Term Goals - 06/13/18 0838      PT SHORT TERM GOAL #1   Status  Partially Met         PT Long Term Goals - 06/10/18 1032      PT LONG TERM GOAL #1   Title  transfer independent    Time  12    Period  Weeks    Status  New      PT LONG TERM GOAL #2   Title  walk with SPC all distances independently    Time  12    Period  Weeks    Status  New      PT LONG TERM GOAL #3   Title  goup and down steps independently step over step    Time  12    Period  Weeks    Status  New      PT LONG TERM GOAL #4   Title  decrease pain 50%    Time  12    Period  Weeks    Status  New      PT LONG TERM GOAL #5   Title  increase AROM to 5-110 degrees flexion    Time  12    Period  Weeks    Status  New            Plan - 06/15/18 3536    Clinical Impression Statement  No progress towards goals, Pt did not tolerate today's treatment well. Reports fatigue and pain throughout. Guarded with MT, multiple rest breaks during aerobic warm up. Min assist needed to move LLE transferring from sitting to supine. Min assist also needed putting LLE on WC leg rest.     Examination-Activity Limitations  Stand;Bed Mobility;Locomotion Level;Toileting;Transfers;Sit;Dressing;Squat;Stairs    Examination-Participation Restrictions  Church;Medication Management;Yard Work;Cleaning;Meal Prep;Community Activity;Personal Finances;Driving;School;Interpersonal Relationship;Shop;Laundry    Stability/Clinical Decision Making  Unstable/Unpredictable    Rehab Potential  Good    PT Frequency  3x / week    PT Duration  8 weeks    PT Treatment/Interventions  ADLs/Self Care Home Management;Cryotherapy;Electrical Stimulation;Therapeutic exercise;Therapeutic activities;Functional mobility training;Stair training;Gait training;Balance training;Neuromuscular re-education;Patient/family education;Manual techniques;Vasopneumatic Device;Passive range of motion    PT Next Visit Plan  Try getting him moving and walking       Patient will benefit from skilled therapeutic intervention in order to improve the  following deficits and impairments:     Visit Diagnosis: Acute pain of left knee  Stiffness of left knee, not elsewhere classified  Difficulty in walking, not elsewhere classified  Localized edema     Problem List Patient Active Problem List   Diagnosis Date Noted  . OA (osteoarthritis) of knee 06/06/2018  . Essential hypertension 09/07/2014  . Hyperlipidemia 09/07/2014  . Groin hematoma  04/06/2014  . Plantar fasciitis, bilateral 03/05/2014  . 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 Abnormal nuclear stress test 01/30/2014  . Coronary artery disease involving native coronary artery of native heart with angina pectoris ([place]) 01/30/2014  . 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 Dyspnea on exertion 01/19/2014  . Gastroesophageal reflux 01/19/2014    Scot Jun 06/15/2018, 9:27 AM  [place], [place], [address] Phone: [phone]   Fax:  [phone]  Name: Steven Yates MRN: [id number] Date of Birth: 17-Jul-1940

## 2018-06-17 ENCOUNTER — Ambulatory Visit: Payer: PPO | Admitting: Physical Therapy

## 2018-06-17 ENCOUNTER — Encounter: Payer: Self-pay | Admitting: Physical Therapy

## 2018-06-17 ENCOUNTER — Other Ambulatory Visit: Payer: Self-pay

## 2018-06-17 DIAGNOSIS — R600 Localized edema: Secondary | ICD-10-CM

## 2018-06-17 DIAGNOSIS — R262 Difficulty in walking, not elsewhere classified: Secondary | ICD-10-CM

## 2018-06-17 DIAGNOSIS — M25562 Pain in left knee: Secondary | ICD-10-CM | POA: Diagnosis not present

## 2018-06-17 DIAGNOSIS — M25662 Stiffness of left knee, not elsewhere classified: Secondary | ICD-10-CM

## 2018-06-17 NOTE — Therapy (Signed)
[place], [place], [address] Phone: [phone]   Fax:  [phone]  Physical Therapy Treatment  Patient Details  Name: Steven Yates MRN: [id number] Date of Birth: 03-26-1941 Referring Provider (PT): Aluisio   Encounter Date: 06/17/2018  PT End of Session - 06/17/18 8264    Visit Number  4    Date for PT Re-Evaluation  08/10/18    PT Start Time  0845    PT Stop Time  0950    PT Time Calculation (min)  65 min    Activity Tolerance  Patient tolerated treatment well;Patient limited by pain;Patient limited by lethargy    Behavior During Therapy  [facility] for tasks assessed/performed       Past Medical History:  Diagnosis Date  . Arthritis   . Coronary artery disease    03-2014 2 stents  . Diabetes mellitus without complication ([place])    type 2  . Gastroesophageal reflux 01/19/2014  . Gout   . HTN (hypertension)   . Hypercholesterolemia   . 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 BACK SURGERY     x2  . CARDIAC CATHETERIZATION  01/2014   Dr. Tamala Julian  . CARDIAC CATHETERIZATION  04/04/2014   Procedure: CORONARY/BYPASS GRAFT CTO INTERVENTION;  Surgeon: Peter M Martinique, MD;  Location: [facility] CATH LAB;  Service: Cardiovascular;;  . CARPAL TUNNEL RELEASE     bil   2019  . CHOLECYSTECTOMY  2010  . CORONARY ANGIOPLASTY WITH STENT PLACEMENT  04/04/2014   "2"  . INGUINAL HERNIA REPAIR Bilateral 1966?  . INTRAVASCULAR PRESSURE WIRE/FFR STUDY N/A 09/01/2016   Procedure: Intravascular Pressure Wire/FFR Study;  Surgeon: Belva Crome, MD;  Location: [place] CV LAB;  Service: Cardiovascular;  Laterality: N/A;  . LEFT HEART CATH AND CORONARY ANGIOGRAPHY N/A 09/01/2016   Procedure: Left Heart Cath and Coronary Angiography;  Surgeon: Belva Crome, MD;  Location: [place] CV LAB;  Service: Cardiovascular;   Laterality: N/A;  . LEFT HEART CATHETERIZATION WITH CORONARY ANGIOGRAM N/A 01/30/2014   Procedure: LEFT HEART CATHETERIZATION WITH CORONARY ANGIOGRAM;  Surgeon: Sinclair Grooms, MD;  Location: [facility] C[facility] LAB;  Service: Cardiovascular;  Laterality: N/A;  . LUMBAR [place] SURGERY  1980's X 2   "ruptured disc"  . PERCUTANEOUS CORONARY STENT INTERVENTION (PCI-S)  01/30/2014   Procedure: PERCUTANEOUS CORONARY STENT INTERVENTION (PCI-S);  Surgeon: Sinclair Grooms, MD;  Location: [facility] CATH LAB;  Service: Cardiovascular;;  . SHOULDER OPEN ROTATOR CUFF REPAIR Right 1990's  . TOTAL KNEE ARTHROPLASTY Left 06/06/2018   Procedure: TOTAL KNEE ARTHROPLASTY;  Surgeon: Gaynelle Arabian, MD;  Location: [facility];  Service: Orthopedics;  Laterality: Left;  40min  . ULTRASOUND GUIDANCE FOR VASCULAR ACCESS  09/01/2016   Procedure: Ultrasound Guidance For Vascular Access;  Surgeon: Belva Crome, MD;  Location: [place] CV LAB;  Service: Cardiovascular;;    There were no vitals filed for this visit.  Subjective Assessment - 06/17/18 0850    Subjective  "a little better"    Currently in Pain?  Yes    Pain Score  7     Pain Location  Knee    Pain Orientation  Left    Pain Descriptors / Indicators  Tightness  [place] Adult PT Treatment/Exercise - 06/17/18 0001      Ambulation/Gait   Ambulation/Gait  Yes    Ambulation Distance (Feet)  48 Feet    Assistive device  Rolling walker    Gait Pattern  Decreased hip/knee flexion - left    Ambulation Surface  Level;Indoor    Gait Comments  Ambulate to car post treatment with RW.      Knee/Hip Exercises: Aerobic   Nustep  level 2 x 7 minutes      Knee/Hip Exercises: Machines for Strengthening   Cybex Leg Press  20lb 2x10, No weight LLE x10, TKE LLE 20lb x5       Knee/Hip Exercises: Seated   Long Arc Quad  Left;20 reps    Long Arc Quad Limitations  2    Other Seated Knee/Hip Exercises  Quad sets x10     Hamstring Curl  10  reps;Left;Strengthening    Hamstring Limitations  red      Vasopneumatic   Number Minutes Vasopneumatic   15 minutes    Vasopnuematic Location   Knee    Vasopneumatic Pressure  Medium      Manual Therapy   Manual Therapy  Passive ROM    Passive ROM  for flexion and extension               PT Short Term Goals - 06/17/18 1008      PT SHORT TERM GOAL #1   Title  independent iwth initial HEP    Status  Achieved        PT Long Term Goals - 06/17/18 1008      PT LONG TERM GOAL #1   Title  transfer independent    Status  On-going      PT LONG TERM GOAL #2   Title  walk with SPC all distances independently    Status  On-going            Plan - 06/17/18 0959    Clinical Impression Statement  Pt did much better today, progress to more exercise interventions. Cures to perform full available ROM with leg curls and extensions. Cues to use LLE on leg press. Increase fatigue noted throughout session, pain at end range during  MT.     Personal Factors and Comorbidities  Behavior Pattern;Fitness;Comorbidity 3+    Comorbidities  gout, RC repair, CAD, 2 + back surgeries    Examination-Activity Limitations  Stand;Bed Mobility;Locomotion Level;Toileting;Transfers;Sit;Dressing;Squat;Stairs    Examination-Participation Restrictions  Church;Medication Management;Yard Work;Cleaning;Meal Prep;Community Activity;Personal Finances;Driving;School;Interpersonal Relationship;Shop;Laundry    PT Frequency  3x / week    PT Duration  8 weeks    PT Treatment/Interventions  ADLs/Self Care Home Management;Cryotherapy;Electrical Stimulation;Therapeutic exercise;Therapeutic activities;Functional mobility training;Stair training;Gait training;Balance training;Neuromuscular re-education;Patient/family education;Manual techniques;Vasopneumatic Device;Passive range of motion    PT Next Visit Plan  Progress with L knee ROM ans strength.     PT Home Exercise Plan  Gave PT red tband for HS curls.         Patient will benefit from skilled therapeutic intervention in order to improve the following deficits and impairments:     Visit Diagnosis: Acute pain of left knee  Stiffness of left knee, not elsewhere classified  Difficulty in walking, not elsewhere classified  Localized edema     Problem List Patient Active Problem List   Diagnosis Date Noted  . OA (osteoarthritis) of knee 06/06/2018  . Essential hypertension 09/07/2014  . Hyperlipidemia 09/07/2014  . Groin hematoma 04/06/2014  . Plantar fasciitis, bilateral  03/05/2014  . 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 Abnormal nuclear stress test 01/30/2014  . Coronary artery disease involving native coronary artery of native heart with angina pectoris ([place]) 01/30/2014  . 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 Dyspnea on exertion 01/19/2014  . Gastroesophageal reflux 01/19/2014    Scot Jun, PTA 06/17/2018, 10:08 AM  [place], [place], [address] Phone: [phone]   Fax:  [phone]  Name: Steven Yates MRN: [id number] Date of Birth: March 01, 1941

## 2018-06-20 ENCOUNTER — Ambulatory Visit: Payer: PPO | Admitting: Physical Therapy

## 2018-06-20 ENCOUNTER — Other Ambulatory Visit: Payer: Self-pay

## 2018-06-20 ENCOUNTER — Encounter: Payer: PPO | Admitting: Physical Therapy

## 2018-06-20 ENCOUNTER — Encounter: Payer: Self-pay | Admitting: Physical Therapy

## 2018-06-20 DIAGNOSIS — I10 Essential (primary) hypertension: Secondary | ICD-10-CM | POA: Diagnosis not present

## 2018-06-20 DIAGNOSIS — E785 Hyperlipidemia, unspecified: Secondary | ICD-10-CM | POA: Diagnosis not present

## 2018-06-20 DIAGNOSIS — R262 Difficulty in walking, not elsewhere classified: Secondary | ICD-10-CM

## 2018-06-20 DIAGNOSIS — M25562 Pain in left knee: Secondary | ICD-10-CM | POA: Diagnosis not present

## 2018-06-20 DIAGNOSIS — M25662 Stiffness of left knee, not elsewhere classified: Secondary | ICD-10-CM

## 2018-06-20 DIAGNOSIS — E119 Type 2 diabetes mellitus without complications: Secondary | ICD-10-CM | POA: Diagnosis not present

## 2018-06-20 DIAGNOSIS — R600 Localized edema: Secondary | ICD-10-CM

## 2018-06-20 NOTE — Therapy (Signed)
[place], [place], [address] Phone: [phone]   Fax:  [phone]  Physical Therapy Treatment  Patient Details  Name: Steven Yates MRN: [id number] Date of Birth: Nov 27, 1940 Referring Provider (PT): Aluisio   Encounter Date: 06/20/2018  PT End of Session - 06/20/18 1050    Visit Number  5    Date for PT Re-Evaluation  08/10/18    PT Start Time  4970    PT Stop Time  1050    PT Time Calculation (min)  58 min    Activity Tolerance  Patient tolerated treatment well;Patient limited by pain;Patient limited by lethargy    Behavior During Therapy  [facility] for tasks assessed/performed       Past Medical History:  Diagnosis Date  . Arthritis   . Coronary artery disease    03-2014 2 stents  . Diabetes mellitus without complication ([place])    type 2  . Gastroesophageal reflux 01/19/2014  . Gout   . HTN (hypertension)   . Hypercholesterolemia   . 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 BACK SURGERY     x2  . CARDIAC CATHETERIZATION  01/2014   Dr. Tamala Julian  . CARDIAC CATHETERIZATION  04/04/2014   Procedure: CORONARY/BYPASS GRAFT CTO INTERVENTION;  Surgeon: Peter M Martinique, MD;  Location: [facility] CATH LAB;  Service: Cardiovascular;;  . CARPAL TUNNEL RELEASE     bil   2019  . CHOLECYSTECTOMY  2010  . CORONARY ANGIOPLASTY WITH STENT PLACEMENT  04/04/2014   "2"  . INGUINAL HERNIA REPAIR Bilateral 1966?  . INTRAVASCULAR PRESSURE WIRE/FFR STUDY N/A 09/01/2016   Procedure: Intravascular Pressure Wire/FFR Study;  Surgeon: Belva Crome, MD;  Location: [place] CV LAB;  Service: Cardiovascular;  Laterality: N/A;  . LEFT HEART CATH AND CORONARY ANGIOGRAPHY N/A 09/01/2016   Procedure: Left Heart Cath and Coronary Angiography;  Surgeon: Belva Crome, MD;  Location: [place] CV LAB;  Service: Cardiovascular;   Laterality: N/A;  . LEFT HEART CATHETERIZATION WITH CORONARY ANGIOGRAM N/A 01/30/2014   Procedure: LEFT HEART CATHETERIZATION WITH CORONARY ANGIOGRAM;  Surgeon: Sinclair Grooms, MD;  Location: [facility] CATH LAB;  Service: Cardiovascular;  Laterality: N/A;  . LUMBAR [place] SURGERY  1980's X 2   "ruptured disc"  . PERCUTANEOUS CORONARY STENT INTERVENTION (PCI-S)  01/30/2014   Procedure: PERCUTANEOUS CORONARY STENT INTERVENTION (PCI-S);  Surgeon: Sinclair Grooms, MD;  Location: [facility] CATH LAB;  Service: Cardiovascular;;  . SHOULDER OPEN ROTATOR CUFF REPAIR Right 1990's  . TOTAL KNEE ARTHROPLASTY Left 06/06/2018   Procedure: TOTAL KNEE ARTHROPLASTY;  Surgeon: Gaynelle Arabian, MD;  Location: [facility];  Service: Orthopedics;  Laterality: Left;  32min  . ULTRASOUND GUIDANCE FOR VASCULAR ACCESS  09/01/2016   Procedure: Ultrasound Guidance For Vascular Access;  Surgeon: Belva Crome, MD;  Location: [place] CV LAB;  Service: Cardiovascular;;    There were no vitals filed for this visit.  Subjective Assessment - 06/20/18 0957    Subjective  Patient reports that he was able to get in and out of a van better.  mHe reports less pain    Currently in Pain?  Yes    Pain Score  5     Pain Location  Knee    Pain Orientation  Left    Pain Descriptors / Indicators  Tightness  Aggravating Factors   movements         [facility] PT Assessment - 06/20/18 0001      AROM   Left Knee Extension  30    Left Knee Flexion  78                   [facility] Adult PT Treatment/Exercise - 06/20/18 0001      Ambulation/Gait   Gait Comments  gait with FWW 180'x2 with cues to stand up straight, bend the knee and take longer steps      Knee/Hip Exercises: Aerobic   Nustep  level 3 x 7 minutes      Knee/Hip Exercises: Machines for Strengthening   Cybex Knee Extension  5# 3x10 really needed cues to get TKE    Cybex Knee Flexion  25# 3x10    Cybex Leg Press  20# 2x10 limited motions cues to get TKE and for flexion       Knee/Hip Exercises: Seated   Long Arc Quad  Left;20 reps    Long Arc Quad Limitations  2    Hamstring Curl  3 sets;10 reps;Left    Hamstring Limitations  red      Knee/Hip Exercises: Supine   Other Supine Knee/Hip Exercises  feet on ball K2C, bridges      Manual Therapy   Manual Therapy  Passive ROM    Passive ROM  for flexion and extension               PT Short Term Goals - 06/17/18 1008      PT SHORT TERM GOAL #1   Title  independent iwth initial HEP    Status  Achieved        PT Long Term Goals - 06/20/18 1054      PT LONG TERM GOAL #1   Title  transfer independent    Status  Achieved            Plan - 06/20/18 1051    Clinical Impression Statement  Patient overall is doing better and feeling better, he was feeling very bad and was not doing anything early last week.  He was able to walk in and out of the clinic today. he is still very limited in his ROM and does not tolerate the ROM.  Needs alot of cues    PT Next Visit Plan  really wokr on ROM and function    Consulted and Agree with Plan of Care  Patient       Patient will benefit from skilled therapeutic intervention in order to improve the following deficits and impairments:  Abnormal gait, Dizziness, Pain, Decreased scar mobility, Decreased mobility, Cardiopulmonary status limiting activity, Decreased activity tolerance, Decreased endurance, Decreased range of motion, Decreased strength, Impaired flexibility, Increased edema, Difficulty walking, Decreased balance  Visit Diagnosis: Acute pain of left knee  Stiffness of left knee, not elsewhere classified  Difficulty in walking, not elsewhere classified  Localized edema     Problem List Patient Active Problem List   Diagnosis Date Noted  . OA (osteoarthritis) of knee 06/06/2018  . Essential hypertension 09/07/2014  . Hyperlipidemia 09/07/2014  . Groin hematoma 04/06/2014  . Plantar fasciitis, bilateral 03/05/2014  . Metatarsal  deformity 03/05/2014  . Abnormal nuclear stress test 01/30/2014  . Coronary artery disease involving native coronary artery of native heart with angina pectoris ([place]) 01/30/2014  . 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 Dyspnea on exertion 01/19/2014  . Gastroesophageal reflux 01/19/2014  Sumner Boast., PT 06/20/2018, 10:55 AM  [place], [place], [address] Phone: [phone]   Fax:  [phone]  Name: Steven Yates MRN: [id number] Date of Birth: 11-07-1940

## 2018-06-22 ENCOUNTER — Encounter: Payer: PPO | Admitting: Physical Therapy

## 2018-06-22 ENCOUNTER — Other Ambulatory Visit: Payer: Self-pay

## 2018-06-22 ENCOUNTER — Ambulatory Visit: Payer: PPO | Admitting: Physical Therapy

## 2018-06-22 ENCOUNTER — Encounter: Payer: Self-pay | Admitting: Physical Therapy

## 2018-06-22 DIAGNOSIS — M25562 Pain in left knee: Secondary | ICD-10-CM | POA: Diagnosis not present

## 2018-06-22 DIAGNOSIS — M25662 Stiffness of left knee, not elsewhere classified: Secondary | ICD-10-CM

## 2018-06-22 DIAGNOSIS — R262 Difficulty in walking, not elsewhere classified: Secondary | ICD-10-CM

## 2018-06-22 DIAGNOSIS — R600 Localized edema: Secondary | ICD-10-CM

## 2018-06-22 NOTE — Therapy (Signed)
[place], [place], [address] Phone: [phone]   Fax:  [phone]  Physical Therapy Treatment  Patient Details  Name: Steven Yates MRN: [id number] Date of Birth: October 13, 1940 Referring Provider (PT): Aluisio   Encounter Date: 06/22/2018  PT End of Session - 06/22/18 1137    Visit Number  6    Date for PT Re-Evaluation  08/10/18    PT Start Time  1055    PT Stop Time  1155    PT Time Calculation (min)  60 min    Activity Tolerance  Patient tolerated treatment well;Patient limited by pain;Patient limited by lethargy    Behavior During Therapy  [facility] for tasks assessed/performed       Past Medical History:  Diagnosis Date  . Arthritis   . Coronary artery disease    03-2014 2 stents  . Diabetes mellitus without complication ([place])    type 2  . Gastroesophageal reflux 01/19/2014  . Gout   . HTN (hypertension)   . Hypercholesterolemia   . 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 BACK SURGERY     x2  . CARDIAC CATHETERIZATION  01/2014   Dr. Tamala Julian  . CARDIAC CATHETERIZATION  04/04/2014   Procedure: CORONARY/BYPASS GRAFT CTO INTERVENTION;  Surgeon: Peter M Martinique, MD;  Location: [facility] CATH LAB;  Service: Cardiovascular;;  . CARPAL TUNNEL RELEASE     bil   2019  . CHOLECYSTECTOMY  2010  . CORONARY ANGIOPLASTY WITH STENT PLACEMENT  04/04/2014   "2"  . INGUINAL HERNIA REPAIR Bilateral 1966?  . INTRAVASCULAR PRESSURE WIRE/FFR STUDY N/A 09/01/2016   Procedure: Intravascular Pressure Wire/FFR Study;  Surgeon: Belva Crome, MD;  Location: [place] CV LAB;  Service: Cardiovascular;  Laterality: N/A;  . LEFT HEART CATH AND CORONARY ANGIOGRAPHY N/A 09/01/2016   Procedure: Left Heart Cath and Coronary Angiography;  Surgeon: Belva Crome, MD;  Location: [place] CV LAB;  Service: Cardiovascular;   Laterality: N/A;  . LEFT HEART CATHETERIZATION WITH CORONARY ANGIOGRAM N/A 01/30/2014   Procedure: LEFT HEART CATHETERIZATION WITH CORONARY ANGIOGRAM;  Surgeon: Sinclair Grooms, MD;  Location: [facility] CATH LAB;  Service: Cardiovascular;  Laterality: N/A;  . LUMBAR [place] SURGERY  1980's X 2   "ruptured disc"  . PERCUTANEOUS CORONARY STENT INTERVENTION (PCI-S)  01/30/2014   Procedure: PERCUTANEOUS CORONARY STENT INTERVENTION (PCI-S);  Surgeon: Sinclair Grooms, MD;  Location: [facility] CATH LAB;  Service: Cardiovascular;;  . SHOULDER OPEN ROTATOR CUFF REPAIR Right 1990's  . TOTAL KNEE ARTHROPLASTY Left 06/06/2018   Procedure: TOTAL KNEE ARTHROPLASTY;  Surgeon: Gaynelle Arabian, MD;  Location: [facility];  Service: Orthopedics;  Laterality: Left;  96min  . ULTRASOUND GUIDANCE FOR VASCULAR ACCESS  09/01/2016   Procedure: Ultrasound Guidance For Vascular Access;  Surgeon: Belva Crome, MD;  Location: [place] CV LAB;  Service: Cardiovascular;;    There were no vitals filed for this visit.  Subjective Assessment - 06/22/18 1108    Subjective  Patient reports that he is stiff and sore.  He reports that he is taking the pain meds to come in to PT.      Currently in Pain?  Yes    Pain Score  6     Pain Location  Knee    Pain Orientation  Left    Aggravating Factors  walking         [facility] PT Assessment - 06/22/18 0001      PROM   Left Knee Extension  15    Left Knee Flexion  85                   [facility] Adult PT Treatment/Exercise - 06/22/18 0001      Ambulation/Gait   Gait Comments  worked on gait with a SPC a lot of cues to bend the knee and to decrease the trunk lean, need to work on heel strike, worked on gait wthout device      Knee/Hip Exercises: Aerobic   Nustep  level 3 x 7 minutes      Knee/Hip Exercises: Standing   Heel Raises  2 sets;15 reps;1 second    Knee Flexion  Left;2 sets;15 reps      Knee/Hip Exercises: Seated   Long Arc Quad  Left;3 sets;10 reps    Other Seated  Knee/Hip Exercises  heel slides with low load long duration stretches    Hamstring Curl  3 sets;10 reps;Left    Hamstring Limitations  green, needs cues to get better ROM      Vasopneumatic   Number Minutes Vasopneumatic   10 minutes    Vasopnuematic Location   Knee    Vasopneumatic Pressure  Medium    Vasopneumatic Temperature   35      Manual Therapy   Manual Therapy  Passive ROM    Passive ROM  for flexion and extension               PT Short Term Goals - 06/17/18 1008      PT SHORT TERM GOAL #1   Title  independent iwth initial HEP    Status  Achieved        PT Long Term Goals - 06/20/18 1054      PT LONG TERM GOAL #1   Title  transfer independent    Status  Achieved            Plan - 06/22/18 1138    Clinical Impression Statement  Patient really having a hard time with dizziness when standing, He has difficulty with stiffness of the knee.  We worked on gait and he needs a lot of cues, but with cues he got better.  Does not tolerate the passive stretch due to pain    PT Next Visit Plan  continue to work on ROM and gait, be careful with his dizziness and lightheaded ness    Consulted and Agree with Plan of Care  Patient       Patient will benefit from skilled therapeutic intervention in order to improve the following deficits and impairments:  Abnormal gait, Dizziness, Pain, Decreased scar mobility, Decreased mobility, Cardiopulmonary status limiting activity, Decreased activity tolerance, Decreased endurance, Decreased range of motion, Decreased strength, Impaired flexibility, Increased edema, Difficulty walking, Decreased balance  Visit Diagnosis: Acute pain of left knee  Stiffness of left knee, not elsewhere classified  Difficulty in walking, not elsewhere classified  Localized edema     Problem List Patient Active Problem List   Diagnosis Date Noted  . OA (osteoarthritis) of knee 06/06/2018  . Essential hypertension 09/07/2014  .  Hyperlipidemia 09/07/2014  . Groin hematoma 04/06/2014  . Plantar fasciitis, bilateral 03/05/2014  . 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 Abnormal nuclear stress test 01/30/2014  . Coronary artery disease involving native coronary artery of native heart with angina pectoris ([place])  01/30/2014  . 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 Dyspnea on exertion 01/19/2014  . Gastroesophageal reflux 01/19/2014    Sumner Boast., PT 06/22/2018, 11:40 AM  [place], [place], [address] Phone: [phone]   Fax:  [phone]  Name: Steven Yates MRN: [id number] Date of Birth: 1941/02/02

## 2018-06-24 ENCOUNTER — Encounter: Payer: Self-pay | Admitting: Physical Therapy

## 2018-06-24 ENCOUNTER — Other Ambulatory Visit: Payer: Self-pay

## 2018-06-24 ENCOUNTER — Ambulatory Visit: Payer: PPO | Admitting: Physical Therapy

## 2018-06-24 DIAGNOSIS — R262 Difficulty in walking, not elsewhere classified: Secondary | ICD-10-CM

## 2018-06-24 DIAGNOSIS — R600 Localized edema: Secondary | ICD-10-CM

## 2018-06-24 DIAGNOSIS — M25562 Pain in left knee: Secondary | ICD-10-CM | POA: Diagnosis not present

## 2018-06-24 DIAGNOSIS — M25662 Stiffness of left knee, not elsewhere classified: Secondary | ICD-10-CM

## 2018-06-24 NOTE — Therapy (Signed)
[place], [place], [address] Phone: [phone]   Fax:  [phone]  Physical Therapy Treatment  Patient Details  Name: Steven Yates MRN: [id number] Date of Birth: 11/03/1940 Referring Provider (PT): Aluisio   Encounter Date: 06/24/2018  PT End of Session - 06/24/18 0946    Visit Number  7    Date for PT Re-Evaluation  08/10/18    PT Start Time  0925    PT Stop Time  1013    PT Time Calculation (min)  48 min    Activity Tolerance  Patient tolerated treatment well    Behavior During Therapy  [facility] for tasks assessed/performed       Past Medical History:  Diagnosis Date  . Arthritis   . Coronary artery disease    03-2014 2 stents  . Diabetes mellitus without complication ([place])    type 2  . Gastroesophageal reflux 01/19/2014  . Gout   . HTN (hypertension)   . Hypercholesterolemia   . 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 BACK SURGERY     x2  . CARDIAC CATHETERIZATION  01/2014   Dr. Tamala Julian  . CARDIAC CATHETERIZATION  04/04/2014   Procedure: CORONARY/BYPASS GRAFT CTO INTERVENTION;  Surgeon: Peter M Martinique, MD;  Location: [facility] CATH LAB;  Service: Cardiovascular;;  . CARPAL TUNNEL RELEASE     bil   2019  . CHOLECYSTECTOMY  2010  . CORONARY ANGIOPLASTY WITH STENT PLACEMENT  04/04/2014   "2"  . INGUINAL HERNIA REPAIR Bilateral 1966?  . INTRAVASCULAR PRESSURE WIRE/FFR STUDY N/A 09/01/2016   Procedure: Intravascular Pressure Wire/FFR Study;  Surgeon: Belva Crome, MD;  Location: [place] CV LAB;  Service: Cardiovascular;  Laterality: N/A;  . LEFT HEART CATH AND CORONARY ANGIOGRAPHY N/A 09/01/2016   Procedure: Left Heart Cath and Coronary Angiography;  Surgeon: Belva Crome, MD;  Location: [place] CV LAB;  Service: Cardiovascular;  Laterality: N/A;  . LEFT HEART CATHETERIZATION WITH  CORONARY ANGIOGRAM N/A 01/30/2014   Procedure: LEFT HEART CATHETERIZATION WITH CORONARY ANGIOGRAM;  Surgeon: Sinclair Grooms, MD;  Location: [facility] CATH LAB;  Service: Cardiovascular;  Laterality: N/A;  . LUMBAR [place] SURGERY  1980's X 2   "ruptured disc"  . PERCUTANEOUS CORONARY STENT INTERVENTION (PCI-S)  01/30/2014   Procedure: PERCUTANEOUS CORONARY STENT INTERVENTION (PCI-S);  Surgeon: Sinclair Grooms, MD;  Location: [facility] CATH LAB;  Service: Cardiovascular;;  . SHOULDER OPEN ROTATOR CUFF REPAIR Right 1990's  . TOTAL KNEE ARTHROPLASTY Left 06/06/2018   Procedure: TOTAL KNEE ARTHROPLASTY;  Surgeon: Gaynelle Arabian, MD;  Location: [facility] ORS;  Service: Orthopedics;  Laterality: Left;  83min  . ULTRASOUND GUIDANCE FOR VASCULAR ACCESS  09/01/2016   Procedure: Ultrasound Guidance For Vascular Access;  Surgeon: Belva Crome, MD;  Location: [place] CV LAB;  Service: Cardiovascular;;    There were no vitals filed for this visit.  Subjective Assessment - 06/24/18 0932    Subjective  Patient reports that anytime he sits for a length of time, he gets very stiff, c/o difficulty getting comfortable    Currently in Pain?  Yes    Pain Score  4     Pain Location  Knee    Pain Orientation  Left    Pain Descriptors / Indicators  Aching;Tightness    Aggravating Factors   any sitting "  I get so stiff"                       [facility] Adult PT Treatment/Exercise - 06/24/18 0001      Knee/Hip Exercises: Aerobic   Nustep  level 4 x 7 minutes      Knee/Hip Exercises: Machines for Strengthening   Cybex Knee Extension  5# 3x10 really needed cues to get TKE    Cybex Knee Flexion  25# 3x10    Cybex Leg Press  20# 3x10, really trying to get flexion, needing a lot of help not to compensate with the hip      Knee/Hip Exercises: Seated   Other Seated Knee/Hip Exercises  low load long duration knee flexion 3x 30 seconds      Manual Therapy   Manual Therapy  Passive ROM    Passive ROM  for flexion and  extension, started some contract relax to help gain the flexion, very painful to him             PT Education - 06/24/18 0946    Education Details  REally went over and reinforced the low load long duration flexion stretch for this weekend    Person(s) Educated  Patient    Methods  Explanation;Demonstration;Tactile cues;Verbal cues;Handout    Comprehension  Verbalized understanding;Tactile cues required;Returned demonstration;Verbal cues required       PT Short Term Goals - 06/17/18 1008      PT SHORT TERM GOAL #1   Title  independent iwth initial HEP    Status  Achieved        PT Long Term Goals - 06/24/18 0950      PT LONG TERM GOAL #3   Title  goup and down steps independently step over step    Status  On-going      PT LONG TERM GOAL #4   Title  decrease pain 50%    Status  On-going            Plan - 06/24/18 0947    Clinical Impression Statement  Patient doing better today with the dizziness, he is really struggling with the flexion, reports "just so tight", needs a lot of help trying to     Examination-Participation Restrictions  Church;Medication Management;Yard Work;Cleaning;Meal Prep;Community Activity;Personal Finances;Driving;School;Interpersonal Relationship;Shop;Laundry    PT Next Visit Plan  really asked him to focus on flexion over the weekend, he declined ice today    Consulted and Agree with Plan of Care  Patient       Patient will benefit from skilled therapeutic intervention in order to improve the following deficits and impairments:  Abnormal gait, Dizziness, Pain, Decreased scar mobility, Decreased mobility, Cardiopulmonary status limiting activity, Decreased activity tolerance, Decreased endurance, Decreased range of motion, Decreased strength, Impaired flexibility, Increased edema, Difficulty walking, Decreased balance  Visit Diagnosis: Acute pain of left knee  Stiffness of left knee, not elsewhere classified  Difficulty in walking, not  elsewhere classified  Localized edema     Problem List Patient Active Problem List   Diagnosis Date Noted  . OA (osteoarthritis) of knee 06/06/2018  . Essential hypertension 09/07/2014  . Hyperlipidemia 09/07/2014  . Groin hematoma 04/06/2014  . Plantar fasciitis, bilateral 03/05/2014  . 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 Abnormal nuclear stress test 01/30/2014  . Coronary artery disease involving native coronary artery of native heart with angina pectoris ([place]) 01/30/2014  . 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 Dyspnea on exertion 01/19/2014  .  Gastroesophageal reflux 01/19/2014    Sumner Boast., PT 06/24/2018, 10:09 AM  [place] [address] [place], [place], [address] Phone: [phone]   Fax:  [phone]  Name: Steven Yates MRN: [id number] Date of Birth: May 15, 1940

## 2018-06-27 ENCOUNTER — Other Ambulatory Visit: Payer: Self-pay

## 2018-06-27 ENCOUNTER — Encounter: Payer: Self-pay | Admitting: Physical Therapy

## 2018-06-27 ENCOUNTER — Ambulatory Visit: Payer: PPO | Admitting: Physical Therapy

## 2018-06-27 DIAGNOSIS — M25562 Pain in left knee: Secondary | ICD-10-CM | POA: Diagnosis not present

## 2018-06-27 DIAGNOSIS — M25662 Stiffness of left knee, not elsewhere classified: Secondary | ICD-10-CM

## 2018-06-27 DIAGNOSIS — R262 Difficulty in walking, not elsewhere classified: Secondary | ICD-10-CM

## 2018-06-27 NOTE — Therapy (Signed)
[place], [place], [address] Phone: [phone]   Fax:  [phone]  Physical Therapy Treatment  Patient Details  Name: Steven Yates MRN: [id number] Date of Birth: 1941/03/29 Referring Provider (PT): Aluisio   Encounter Date: 06/27/2018  PT End of Session - 06/27/18 1136    Visit Number  8    Date for PT Re-Evaluation  08/10/18    PT Start Time  3664    PT Stop Time  1140    PT Time Calculation (min)  42 min       Past Medical History:  Diagnosis Date  . Arthritis   . Coronary artery disease    03-2014 2 stents  . Diabetes mellitus without complication ([place])    type 2  . Gastroesophageal reflux 01/19/2014  . Gout   . HTN (hypertension)   . Hypercholesterolemia   . 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 BACK SURGERY     x2  . CARDIAC CATHETERIZATION  01/2014   Dr. Tamala Julian  . CARDIAC CATHETERIZATION  04/04/2014   Procedure: CORONARY/BYPASS GRAFT CTO INTERVENTION;  Surgeon: Peter M Martinique, MD;  Location: [facility] CATH LAB;  Service: Cardiovascular;;  . CARPAL TUNNEL RELEASE     bil   2019  . CHOLECYSTECTOMY  2010  . CORONARY ANGIOPLASTY WITH STENT PLACEMENT  04/04/2014   "2"  . INGUINAL HERNIA REPAIR Bilateral 1966?  . INTRAVASCULAR PRESSURE WIRE/FFR STUDY N/A 09/01/2016   Procedure: Intravascular Pressure Wire/FFR Study;  Surgeon: Belva Crome, MD;  Location: [place] CV LAB;  Service: Cardiovascular;  Laterality: N/A;  . LEFT HEART CATH AND CORONARY ANGIOGRAPHY N/A 09/01/2016   Procedure: Left Heart Cath and Coronary Angiography;  Surgeon: Belva Crome, MD;  Location: [place] CV LAB;  Service: Cardiovascular;  Laterality: N/A;  . LEFT HEART CATHETERIZATION WITH CORONARY ANGIOGRAM N/A 01/30/2014   Procedure: LEFT HEART CATHETERIZATION WITH CORONARY ANGIOGRAM;  Surgeon: Sinclair Grooms, MD;  Location: [facility] CATH LAB;  Service: Cardiovascular;  Laterality: N/A;  . LUMBAR [place] SURGERY  1980's X 2   "ruptured disc"  . PERCUTANEOUS CORONARY STENT INTERVENTION (PCI-S)  01/30/2014   Procedure: PERCUTANEOUS CORONARY STENT INTERVENTION (PCI-S);  Surgeon: Sinclair Grooms, MD;  Location: [facility] C[facility] LAB;  Service: Cardiovascular;;  . SHOULDER OPEN ROTATOR CUFF REPAIR Right 1990's  . TOTAL KNEE ARTHROPLASTY Left 06/06/2018   Procedure: TOTAL KNEE ARTHROPLASTY;  Surgeon: Gaynelle Arabian, MD;  [facility];  Service: Orthopedics;  Laterality: Left;  35min  . ULTRASOUND GUIDANCE FOR VASCULAR ACCESS  09/01/2016   Procedure: Ultrasound Guidance For Vascular Access;  Surgeon: Belva Crome, MD;  Location: [place] CV LAB;  Service: Cardiovascular;;    There were no vitals filed for this visit.  Subjective Assessment - 06/27/18 1101    Subjective  "A touch of gout in that L foot"    Currently in Pain?  Yes    Pain Score  7     Pain Location  Foot    Pain Orientation  Left                       [facility] Adult PT Treatment/Exercise - 06/27/18 0001      Knee/Hip Exercises: Aerobic   Recumbent Bike  partial revs x2 min  Nustep  level 4 x 7 minutes      Knee/Hip Exercises: Machines for Strengthening   Cybex Knee Extension  5lb RLE 2x5     Cybex Knee Flexion  35# 2x10, LLe 20lb 2x10     Cybex Leg Press  20# 3x10, really trying to get flexion      Knee/Hip Exercises: Standing   Forward Step Up  Both;Left;1 set;5 reps;Hand Hold: 1;Step Height: 4"               PT Short Term Goals - 06/17/18 1008      PT SHORT TERM GOAL #1   Title  independent iwth initial HEP    Status  Achieved        PT Long Term Goals - 06/24/18 0950      PT LONG TERM GOAL #3   Title  goup and down steps independently step over step    Status  On-going      PT LONG TERM GOAL #4   Title  decrease pain 50%    Status  On-going            Plan - 06/27/18 1137     Clinical Impression Statement  Increase fatigue with today's exercises, needing frequent rest.  Cues to keep feet planted on leg press. Able to progress to some SL strengthening with curls and extensions. Dizziness noted with step up so exercise was discontinued. Decline ice post treatment.     Personal Factors and Comorbidities  Behavior Pattern;Fitness;Comorbidity 3+    Comorbidities  gout, RC repair, CAD, 2 + back surgeries    Examination-Activity Limitations  Stand;Bed Mobility;Locomotion Level;Toileting;Transfers;Sit;Dressing;Squat;Stairs    Examination-Participation Restrictions  Church;Medication Management;Yard Work;Cleaning;Meal Prep;Community Activity;Personal Finances;Driving;School;Interpersonal Relationship;Shop;Laundry    Stability/Clinical Decision Making  Unstable/Unpredictable    Rehab Potential  Good    PT Frequency  3x / week    PT Duration  8 weeks    PT Treatment/Interventions  ADLs/Self Care Home Management;Cryotherapy;Electrical Stimulation;Therapeutic exercise;Therapeutic activities;Functional mobility training;Stair training;Gait training;Balance training;Neuromuscular re-education;Patient/family education;Manual techniques;Vasopneumatic Device;Passive range of motion    PT Next Visit Plan  LLE ROM and strength       Patient will benefit from skilled therapeutic intervention in order to improve the following deficits and impairments:  Abnormal gait, Dizziness, Pain, Decreased scar mobility, Decreased mobility, Cardiopulmonary status limiting activity, Decreased activity tolerance, Decreased endurance, Decreased range of motion, Decreased strength, Impaired flexibility, Increased edema, Difficulty walking, Decreased balance  Visit Diagnosis: Acute pain of left knee  Stiffness of left knee, not elsewhere classified  Difficulty in walking, not elsewhere classified     Problem List Patient Active Problem List   Diagnosis Date Noted  . OA (osteoarthritis) of knee  06/06/2018  . Essential hypertension 09/07/2014  . Hyperlipidemia 09/07/2014  . Groin hematoma 04/06/2014  . Plantar fasciitis, bilateral 03/05/2014  . 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 Abnormal nuclear stress test 01/30/2014  . Coronary artery disease involving native coronary artery of native heart with angina pectoris ([place]) 01/30/2014  . 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 Dyspnea on exertion 01/19/2014  . Gastroesophageal reflux 01/19/2014    Scot Jun, PTA 06/27/2018, 11:41 AM  [place], [place], [address] Phone: [phone]   Fax:  [phone]  Name: SALVADORE VALVANO MRN: [id number] Date of Birth: 1941-02-03

## 2018-06-29 ENCOUNTER — Other Ambulatory Visit: Payer: Self-pay

## 2018-06-29 ENCOUNTER — Ambulatory Visit: Payer: PPO | Attending: Orthopedic Surgery | Admitting: Physical Therapy

## 2018-06-29 ENCOUNTER — Encounter: Payer: Self-pay | Admitting: Physical Therapy

## 2018-06-29 DIAGNOSIS — M25562 Pain in left knee: Secondary | ICD-10-CM | POA: Insufficient documentation

## 2018-06-29 DIAGNOSIS — M25662 Stiffness of left knee, not elsewhere classified: Secondary | ICD-10-CM | POA: Insufficient documentation

## 2018-06-29 DIAGNOSIS — R262 Difficulty in walking, not elsewhere classified: Secondary | ICD-10-CM | POA: Diagnosis not present

## 2018-06-29 DIAGNOSIS — R600 Localized edema: Secondary | ICD-10-CM | POA: Insufficient documentation

## 2018-06-29 NOTE — Therapy (Signed)
[place], [place], [address] Phone: [phone]   Fax:  [phone]  Physical Therapy Treatment  Patient Details  Name: Steven Yates MRN: [id number] Date of Birth: 05-06-1940 Referring Provider (PT): Aluisio   Encounter Date: 06/29/2018  PT End of Session - 06/29/18 1055    Visit Number  9    Date for PT Re-Evaluation  08/10/18    PT Start Time  8127    PT Stop Time  1100    PT Time Calculation (min)  45 min    Activity Tolerance  Patient limited by fatigue;Patient tolerated treatment well    Behavior During Therapy  [facility] for tasks assessed/performed       Past Medical History:  Diagnosis Date  . Arthritis   . Coronary artery disease    03-2014 2 stents  . Diabetes mellitus without complication ([place])    type 2  . Gastroesophageal reflux 01/19/2014  . Gout   . HTN (hypertension)   . Hypercholesterolemia   . 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 BACK SURGERY     x2  . CARDIAC CATHETERIZATION  01/2014   Dr. Tamala Julian  . CARDIAC CATHETERIZATION  04/04/2014   Procedure: CORONARY/BYPASS GRAFT CTO INTERVENTION;  Surgeon: Peter M Martinique, MD;  Location: [facility] CATH LAB;  Service: Cardiovascular;;  . CARPAL TUNNEL RELEASE     bil   2019  . CHOLECYSTECTOMY  2010  . CORONARY ANGIOPLASTY WITH STENT PLACEMENT  04/04/2014   "2"  . INGUINAL HERNIA REPAIR Bilateral 1966?  . INTRAVASCULAR PRESSURE WIRE/FFR STUDY N/A 09/01/2016   Procedure: Intravascular Pressure Wire/FFR Study;  Surgeon: Belva Crome, MD;  Location: [place] CV LAB;  Service: Cardiovascular;  Laterality: N/A;  . LEFT HEART CATH AND CORONARY ANGIOGRAPHY N/A 09/01/2016   Procedure: Left Heart Cath and Coronary Angiography;  Surgeon: Belva Crome, MD;  Location: [place] CV LAB;  Service: Cardiovascular;  Laterality: N/A;  . LEFT  HEART CATHETERIZATION WITH CORONARY ANGIOGRAM N/A 01/30/2014   Procedure: LEFT HEART CATHETERIZATION WITH CORONARY ANGIOGRAM;  Surgeon: Sinclair Grooms, MD;  Location: [facility] CATH LAB;  Service: Cardiovascular;  Laterality: N/A;  . LUMBAR [place] SURGERY  1980's X 2   "ruptured disc"  . PERCUTANEOUS CORONARY STENT INTERVENTION (PCI-S)  01/30/2014   Procedure: PERCUTANEOUS CORONARY STENT INTERVENTION (PCI-S);  Surgeon: Sinclair Grooms, MD;  Location: [facility] CATH LAB;  Service: Cardiovascular;;  . SHOULDER OPEN ROTATOR CUFF REPAIR Right 1990's  . TOTAL KNEE ARTHROPLASTY Left 06/06/2018   Procedure: TOTAL KNEE ARTHROPLASTY;  Surgeon: Gaynelle Arabian, MD;  [facility];  Service: Orthopedics;  Laterality: Left;  77mn  . ULTRASOUND GUIDANCE FOR VASCULAR ACCESS  09/01/2016   Procedure: Ultrasound Guidance For Vascular Access;  Surgeon: SBelva Crome MD;  [facility];  Service: Cardiovascular;;    There were no vitals filed for this visit.  Subjective Assessment - 06/29/18 1026    Subjective  My back is hurting today, not sure what caused it    Currently in Pain?  Yes    Pain Score  6     Pain Location  Knee   back   Pain Orientation  Left    Pain Descriptors / Indicators  Aching    Aggravating Factors   bending the knee  [facility] Adult PT Treatment/Exercise - 06/29/18 0001      Ambulation/Gait   Gait Comments  with SPC, cues for step length and to bend the knee, some cues for posture, outside and on slope, worked on stairs step over step, the hil and the walk really got him winded and he needed to stop and rest      Knee/Hip Exercises: Aerobic   Recumbent Bike  full revolutions seat position #8 some cues to decrease the hip hike    Nustep  level 4 x 7 minutes      Knee/Hip Exercises: Standing   Heel Raises  2 sets;15 reps;1 second    Knee Flexion  Left;2 sets;15 reps    Knee Flexion Limitations  3#      Knee/Hip Exercises: Seated   Long  Arc Quad  Left;3 sets;10 reps    Long Arc Quad Weight  3 lbs.    Long [employer] Limitations  cues to get [employer]  20      Knee/Hip Exercises: Supine   Other Supine Knee/Hip Exercises  feet on ball K2C, bridges               PT Short Term Goals - 06/17/18 1008      PT SHORT TERM GOAL #1   Title  independent iwth initial HEP    Status  Achieved        PT Long Term Goals - 06/29/18 1100      PT LONG TERM GOAL #2   Title  walk with SPC all distances independently    Status  On-going      PT LONG TERM GOAL #3   Title  goup and down steps independently step over step    Status  Partially Met            Plan - 06/29/18 1056    Clinical Impression Statement  Patient able to make full revolutions on bike, able to do some stairs step over step, the walking especially the slope got him very winded and he needed to rest and was done after that, even declined modalities due to fatigue    PT Next Visit Plan  build up stamina    Consulted and Agree with Plan of Care  Patient       Patient will benefit from skilled therapeutic intervention in order to improve the following deficits and impairments:  Abnormal gait, Dizziness, Pain, Decreased scar mobility, Decreased mobility, Cardiopulmonary status limiting activity, Decreased activity tolerance, Decreased endurance, Decreased range of motion, Decreased strength, Impaired flexibility, Increased edema, Difficulty walking, Decreased balance  Visit Diagnosis: Acute pain of left knee  Stiffness of left knee, not elsewhere classified  Difficulty in walking, not elsewhere classified  Localized edema     Problem List Patient Active Problem List   Diagnosis Date Noted  . OA (osteoarthritis) of knee 06/06/2018  . Essential hypertension 09/07/2014  . Hyperlipidemia 09/07/2014  . Groin hematoma 04/06/2014  . Plantar fasciitis, bilateral 03/05/2014  . 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 Abnormal nuclear stress  test 01/30/2014  . Coronary artery disease involving native coronary artery of native heart with angina pectoris ([place]) 01/30/2014  . 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 Dyspnea on exertion 01/19/2014  . Gastroesophageal reflux 01/19/2014    Sumner Boast., PT 06/29/2018, 11:00 AM  [place] [address] [place], [place], [address] Phone: [phone]   Fax:  [phone]  Name: Steven Yates MRN: [id number] Date of Birth: 02-Sep-1940

## 2018-06-30 ENCOUNTER — Ambulatory Visit: Payer: PPO | Admitting: Physical Therapy

## 2018-06-30 ENCOUNTER — Other Ambulatory Visit: Payer: Self-pay

## 2018-06-30 ENCOUNTER — Encounter: Payer: Self-pay | Admitting: Physical Therapy

## 2018-06-30 DIAGNOSIS — R262 Difficulty in walking, not elsewhere classified: Secondary | ICD-10-CM

## 2018-06-30 DIAGNOSIS — R600 Localized edema: Secondary | ICD-10-CM

## 2018-06-30 DIAGNOSIS — M25562 Pain in left knee: Secondary | ICD-10-CM | POA: Diagnosis not present

## 2018-06-30 DIAGNOSIS — M25662 Stiffness of left knee, not elsewhere classified: Secondary | ICD-10-CM

## 2018-06-30 NOTE — Therapy (Signed)
[address] [place], [place], [address] Phone: [phone]   Fax:  [phone] Progress Note Reporting Period 06/10/18 to 06/30/18 for the first 10 visits See note below for Objective Data and Assessment of Progress/Goals.      Physical Therapy Treatment  Patient Details  Name: Steven Yates MRN: [id number] Date of Birth: January 17, 1941 Referring Provider (PT): Aluisio   Encounter Date: 06/30/2018  PT End of Session - 06/30/18 1142    Visit Number  10    Date for PT Re-Evaluation  08/10/18    PT Start Time  1010    PT Stop Time  1050    PT Time Calculation (min)  40 min    Activity Tolerance  Patient tolerated treatment well    Behavior During Therapy  [facility] for tasks assessed/performed       Past Medical History:  Diagnosis Date  . Arthritis   . Coronary artery disease    03-2014 2 stents  . Diabetes mellitus without complication ([place])    type 2  . Gastroesophageal reflux 01/19/2014  . Gout   . HTN (hypertension)   . Hypercholesterolemia   . 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 BACK SURGERY     x2  . CARDIAC CATHETERIZATION  01/2014   Dr. Tamala Julian  . CARDIAC CATHETERIZATION  04/04/2014   Procedure: CORONARY/BYPASS GRAFT CTO INTERVENTION;  Surgeon: Peter M Martinique, MD;  Location: [facility] CATH LAB;  Service: Cardiovascular;;  . CARPAL TUNNEL RELEASE     bil   2019  . CHOLECYSTECTOMY  2010  . CORONARY ANGIOPLASTY WITH STENT PLACEMENT  04/04/2014   "2"  . INGUINAL HERNIA REPAIR Bilateral 1966?  . INTRAVASCULAR PRESSURE WIRE/FFR STUDY N/A 09/01/2016   Procedure: Intravascular Pressure Wire/FFR Study;  Surgeon: Belva Crome, MD;  Location: [place] CV LAB;  Service: Cardiovascular;  Laterality: N/A;  . LEFT HEART CATH AND CORONARY ANGIOGRAPHY N/A 09/01/2016   Procedure: Left Heart Cath and Coronary  Angiography;  Surgeon: Belva Crome, MD;  Location: [place] CV LAB;  Service: Cardiovascular;  Laterality: N/A;  . LEFT HEART CATHETERIZATION WITH CORONARY ANGIOGRAM N/A 01/30/2014   Procedure: LEFT HEART CATHETERIZATION WITH CORONARY ANGIOGRAM;  Surgeon: Sinclair Grooms, MD;  Location: [facility] CATH LAB;  Service: Cardiovascular;  Laterality: N/A;  . LUMBAR [place] SURGERY  1980's X 2   "ruptured disc"  . PERCUTANEOUS CORONARY STENT INTERVENTION (PCI-S)  01/30/2014   Procedure: PERCUTANEOUS CORONARY STENT INTERVENTION (PCI-S);  Surgeon: Sinclair Grooms, MD;  Location: [facility] CATH LAB;  Service: Cardiovascular;;  . SHOULDER OPEN ROTATOR CUFF REPAIR Right 1990's  . TOTAL KNEE ARTHROPLASTY Left 06/06/2018   Procedure: TOTAL KNEE ARTHROPLASTY;  Surgeon: Gaynelle Arabian, MD;  Location: [facility] ORS;  Service: Orthopedics;  Laterality: Left;  74mn  . ULTRASOUND GUIDANCE FOR VASCULAR ACCESS  09/01/2016   Procedure: Ultrasound Guidance For Vascular Access;  Surgeon: SBelva Crome MD;  [place];  Service: Cardiovascular;;    There were no vitals filed for this visit.  Subjective Assessment - 06/30/18 1018    Subjective  I did okay after the walk yesterday, just really worn out, stiff but did okay    Currently in Pain?  Yes    Pain Score  7     Pain Location  Knee  [facility] Adult PT Treatment/Exercise - 06/30/18 0001      Ambulation/Gait   Gait Comments  SPC, down stairs half of the flight step over step, did some ascending step over step, then outside half way around the building.      Knee/Hip Exercises: Aerobic   Recumbent Bike  full revolutions seat position #8 some cues to decrease the hip hike    Nustep  level 4 x 7 minutes      Knee/Hip Exercises: Machines for Strengthening   Cybex Knee Extension  5lb RLE 2x5     Cybex Knee Flexion  20# 3x10, backed off today due to him feeling that this may have caused the back pain    Cybex Leg Press  20#  3x10, really trying to get flexion      Knee/Hip Exercises: Standing   Other Standing Knee Exercises  8" toe clears      Knee/Hip Exercises: Seated   Long Arc Quad  Left;3 sets;10 reps    Long Arc Quad Weight  3 lbs.    Long [employer] Limitations  cues to get TKE      Knee/Hip Exercises: Supine   Other Supine Knee/Hip Exercises  feet on ball K2C, bridges               PT Short Term Goals - 06/17/18 1008      PT SHORT TERM GOAL #1   Title  independent iwth initial HEP    Status  Achieved        PT Long Term Goals - 06/30/18 1143      PT LONG TERM GOAL #2   Title  walk with SPC all distances independently    Status  Partially Met            Plan - 06/30/18 1142    Clinical Impression Statement  Patient iwth some c/o stiffness and fatigue from the exercises yesterday, he does report that he is very happy he did all we did, he is walking with a SPC into and out of the clinic today.    PT Next Visit Plan  continue to work with stamina and strength and his ROM    Consulted and Agree with Plan of Care  Patient       Patient will benefit from skilled therapeutic intervention in order to improve the following deficits and impairments:  Abnormal gait, Dizziness, Pain, Decreased scar mobility, Decreased mobility, Cardiopulmonary status limiting activity, Decreased activity tolerance, Decreased endurance, Decreased range of motion, Decreased strength, Impaired flexibility, Increased edema, Difficulty walking, Decreased balance  Visit Diagnosis: Acute pain of left knee  Stiffness of left knee, not elsewhere classified  Difficulty in walking, not elsewhere classified  Localized edema     Problem List Patient Active Problem List   Diagnosis Date Noted  . OA (osteoarthritis) of knee 06/06/2018  . Essential hypertension 09/07/2014  . Hyperlipidemia 09/07/2014  . Groin hematoma 04/06/2014  . Plantar fasciitis, bilateral 03/05/2014  . Metatarsal deformity  03/05/2014  . Abnormal nuclear stress test 01/30/2014  . Coronary artery disease involving native coronary artery of native heart with angina pectoris ([place]) 01/30/2014  . 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 Dyspnea on exertion 01/19/2014  . Gastroesophageal reflux 01/19/2014    Sumner Boast., PT 06/30/2018, 11:47 AM  [address] [place], [place], [address] Phone: [phone]   Fax:  [phone]  Name: Steven Yates MRN: [id number] Date  of Birth: [birth year]

## 2018-07-05 ENCOUNTER — Ambulatory Visit: Payer: PPO | Admitting: Physical Therapy

## 2018-07-05 ENCOUNTER — Other Ambulatory Visit: Payer: Self-pay

## 2018-07-05 ENCOUNTER — Encounter: Payer: Self-pay | Admitting: Physical Therapy

## 2018-07-05 DIAGNOSIS — M25562 Pain in left knee: Secondary | ICD-10-CM

## 2018-07-05 DIAGNOSIS — M25662 Stiffness of left knee, not elsewhere classified: Secondary | ICD-10-CM

## 2018-07-05 DIAGNOSIS — R600 Localized edema: Secondary | ICD-10-CM

## 2018-07-05 DIAGNOSIS — R262 Difficulty in walking, not elsewhere classified: Secondary | ICD-10-CM

## 2018-07-05 NOTE — Therapy (Signed)
[address] [place], [place], [address] Phone: [phone]   Fax:  [phone]  Physical Therapy Treatment  Patient Details  Name: Steven Yates MRN: [id number] Date of Birth: February 10, 1941 Referring Provider (PT): Aluisio   Encounter Date: 07/05/2018  PT End of Session - 07/05/18 1105    Visit Number  11    Date for PT Re-Evaluation  08/10/18    PT Start Time  1008    PT Stop Time  1057    PT Time Calculation (min)  49 min    Activity Tolerance  Patient tolerated treatment well    Behavior During Therapy  [facility] for tasks assessed/performed       Past Medical History:  Diagnosis Date  . Arthritis   . Coronary artery disease    03-2014 2 stents  . Diabetes mellitus without complication ([place])    type 2  . Gastroesophageal reflux 01/19/2014  . Gout   . HTN (hypertension)   . Hypercholesterolemia   . 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 BACK SURGERY     x2  . CARDIAC CATHETERIZATION  01/2014   Dr. Tamala Julian  . CARDIAC CATHETERIZATION  04/04/2014   Procedure: CORONARY/BYPASS GRAFT CTO INTERVENTION;  Surgeon: Peter M Martinique, MD;  Location: [facility] CATH LAB;  Service: Cardiovascular;;  . CARPAL TUNNEL RELEASE     bil   2019  . CHOLECYSTECTOMY  2010  . CORONARY ANGIOPLASTY WITH STENT PLACEMENT  04/04/2014   "2"  . INGUINAL HERNIA REPAIR Bilateral 1966?  . INTRAVASCULAR PRESSURE WIRE/FFR STUDY N/A 09/01/2016   Procedure: Intravascular Pressure Wire/FFR Study;  Surgeon: Belva Crome, MD;  Location: [place] CV LAB;  Service: Cardiovascular;  Laterality: N/A;  . LEFT HEART CATH AND CORONARY ANGIOGRAPHY N/A 09/01/2016   Procedure: Left Heart Cath and Coronary Angiography;  Surgeon: Belva Crome, MD;  Location: [place] CV LAB;  Service: Cardiovascular;  Laterality: N/A;  . LEFT HEART CATHETERIZATION WITH  CORONARY ANGIOGRAM N/A 01/30/2014   Procedure: LEFT HEART CATHETERIZATION WITH CORONARY ANGIOGRAM;  Surgeon: Sinclair Grooms, MD;  Location: [facility] CATH LAB;  Service: Cardiovascular;  Laterality: N/A;  . LUMBAR [place] SURGERY  1980's X 2   "ruptured disc"  . PERCUTANEOUS CORONARY STENT INTERVENTION (PCI-S)  01/30/2014   Procedure: PERCUTANEOUS CORONARY STENT INTERVENTION (PCI-S);  Surgeon: Sinclair Grooms, MD;  Location: [facility] CATH LAB;  Service: Cardiovascular;;  . SHOULDER OPEN ROTATOR CUFF REPAIR Right 1990's  . TOTAL KNEE ARTHROPLASTY Left 06/06/2018   Procedure: TOTAL KNEE ARTHROPLASTY;  Surgeon: Gaynelle Arabian, MD;  [facility];  Service: Orthopedics;  Laterality: Left;  1mn  . ULTRASOUND GUIDANCE FOR VASCULAR ACCESS  09/01/2016   Procedure: Ultrasound Guidance For Vascular Access;  Surgeon: SBelva Crome MD;  [place];  Service: Cardiovascular;;    There were no vitals filed for this visit.  Subjective Assessment - 07/05/18 1025    Subjective  Patient reports fatigue and some increase of pain with u[place]creasing walking here.  He also thought that the knee extension or curls increased his back pain    Currently in Pain?  Yes    Pain Score  6     Pain Location  Knee    Pain Orientation  Left    Aggravating Factors   walking  [facility] PT Assessment - 07/05/18 0001      AROM   Left Knee Extension  13    Left Knee Flexion  103      PROM   Left Knee Extension  5    Left Knee Flexion  112                   [facility] Adult PT Treatment/Exercise - 07/05/18 0001      Ambulation/Gait   Gait Comments  no device, down stairs mostly step over step, then outside and up slope, he does become very fatigued      Knee/Hip Exercises: Aerobic   Recumbent Bike  full revolutions seat position #8 some cues to decrease the hip hike x 6 minutes    Nustep  level 4 x 7 minutes      Knee/Hip Exercises: Machines for Strengthening   Cybex Leg Press  30# 2x10,  left only 3x5 with 20#      Knee/Hip Exercises: Standing   Knee Flexion  Left;2 sets;15 reps    Forward Step Up  Step Height: 4";2 sets;10 reps    Walking with Sports Cord  all directions      Manual Therapy   Manual Therapy  Passive ROM    Passive ROM  flexion and extension, use of mobilization belt to get more ROM               PT Short Term Goals - 06/17/18 1008      PT SHORT TERM GOAL #1   Title  independent iwth initial HEP    Status  Achieved        PT Long Term Goals - 06/30/18 1143      PT LONG TERM GOAL #2   Title  walk with SPC all distances independently    Status  Partially Met            Plan - 07/05/18 1105    Clinical Impression Statement  Patient doing well, increased ROM however he really fatigues with any resisted gait and with walking up a hill.  He had to rest with the resisted gait    PT Next Visit Plan  continue to work with stamina and strength and his ROM    Consulted and Agree with Plan of Care  Patient       Patient will benefit from skilled therapeutic intervention in order to improve the following deficits and impairments:  Abnormal gait, Dizziness, Pain, Decreased scar mobility, Decreased mobility, Cardiopulmonary status limiting activity, Decreased activity tolerance, Decreased endurance, Decreased range of motion, Decreased strength, Impaired flexibility, Increased edema, Difficulty walking, Decreased balance  Visit Diagnosis: Acute pain of left knee  Stiffness of left knee, not elsewhere classified  Difficulty in walking, not elsewhere classified  Localized edema     Problem List Patient Active Problem List   Diagnosis Date Noted  . OA (osteoarthritis) of knee 06/06/2018  . Essential hypertension 09/07/2014  . Hyperlipidemia 09/07/2014  . Groin hematoma 04/06/2014  . Plantar fasciitis, bilateral 03/05/2014  . 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 Abnormal nuclear stress test 01/30/2014  . Coronary artery disease  involving native coronary artery of native heart with angina pectoris ([place]) 01/30/2014  . 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 Dyspnea on exertion 01/19/2014  . Gastroesophageal reflux 01/19/2014    Steven Boast., PT 07/05/2018, 11:08 AM  [address] [place], [place], [address] Phone: [phone]  Fax:  [phone]  Name: Steven Yates MRN: [id number] Date of Birth: 08/12/1940

## 2018-07-07 ENCOUNTER — Encounter: Payer: Self-pay | Admitting: Physical Therapy

## 2018-07-07 ENCOUNTER — Other Ambulatory Visit: Payer: Self-pay

## 2018-07-07 ENCOUNTER — Ambulatory Visit: Payer: PPO | Admitting: Physical Therapy

## 2018-07-07 DIAGNOSIS — M25562 Pain in left knee: Secondary | ICD-10-CM

## 2018-07-07 DIAGNOSIS — R262 Difficulty in walking, not elsewhere classified: Secondary | ICD-10-CM

## 2018-07-07 DIAGNOSIS — M25662 Stiffness of left knee, not elsewhere classified: Secondary | ICD-10-CM

## 2018-07-07 DIAGNOSIS — R600 Localized edema: Secondary | ICD-10-CM

## 2018-07-07 NOTE — Therapy (Signed)
[address] [place], [place], [address] Phone: [phone]   Fax:  [phone]  Physical Therapy Treatment  Patient Details  Name: Steven Yates MRN: [id number] Date of Birth: [birth year] Referring Provider (PT): Aluisio   Encounter Date: 07/07/2018  PT End of Session - 07/07/18 1054    Visit Number  12    Date for PT Re-Evaluation  08/10/18    PT Start Time  1005    PT Stop Time  1055    PT Time Calculation (min)  50 min    Activity Tolerance  Patient tolerated treatment well;Patient limited by fatigue    Behavior During Therapy  [facility] for tasks assessed/performed       Past Medical History:  Diagnosis Date  . Arthritis   . Coronary artery disease    03-2014 2 stents  . Diabetes mellitus without complication ([place])    type 2  . Gastroesophageal reflux 01/19/2014  . Gout   . HTN (hypertension)   . Hypercholesterolemia   . 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 BACK SURGERY     x2  . CARDIAC CATHETERIZATION  01/2014   Dr. Tamala Julian  . CARDIAC CATHETERIZATION  04/04/2014   Procedure: CORONARY/BYPASS GRAFT CTO INTERVENTION;  Surgeon: Peter M Martinique, MD;  Location: [facility] CATH LAB;  Service: Cardiovascular;;  . CARPAL TUNNEL RELEASE     bil   2019  . CHOLECYSTECTOMY  2010  . CORONARY ANGIOPLASTY WITH STENT PLACEMENT  04/04/2014   "2"  . INGUINAL HERNIA REPAIR Bilateral 1966?  . INTRAVASCULAR PRESSURE WIRE/FFR STUDY N/A 09/01/2016   Procedure: Intravascular Pressure Wire/FFR Study;  Surgeon: Belva Crome, MD;  Location: [place] CV LAB;  Service: Cardiovascular;  Laterality: N/A;  . LEFT HEART CATH AND CORONARY ANGIOGRAPHY N/A 09/01/2016   Procedure: Left Heart Cath and Coronary Angiography;  Surgeon: Belva Crome, MD;  Location: [place] CV LAB;  Service: Cardiovascular;  Laterality: N/A;  . LEFT  HEART CATHETERIZATION WITH CORONARY ANGIOGRAM N/A 01/30/2014   Procedure: LEFT HEART CATHETERIZATION WITH CORONARY ANGIOGRAM;  Surgeon: Sinclair Grooms, MD;  Location[facility] CATH LAB;  Service: Cardiovascular;  Laterality: N/A;  . LUMBAR [place] SURGERY  1980's X 2   "ruptured disc"  . PERCUTANEOUS CORONARY STENT INTERVENTION (PCI-S)  01/30/2014   Procedure: PERCUTANEOUS CORONARY STENT INTERVENTION (PCI-S);  Surgeon: Sinclair Grooms, MD;  Location: [facility] CATH LAB;  Service: Cardiovascular;;  . SHOULDER OPEN ROTATOR CUFF REPAIR Right 1990's  . TOTAL KNEE ARTHROPLASTY Left 06/06/2018   Procedure: TOTAL KNEE ARTHROPLASTY;  Surgeon: Gaynelle Arabian, MD;  [facility];  Service: Orthopedics;  Laterality: Left;  63mn  . ULTRASOUND GUIDANCE FOR VASCULAR ACCESS  09/01/2016   Procedure: Ultrasound Guidance For Vascular Access;  Surgeon: SBelva Crome MD;  [facility] [place];  Service: Cardiovascular;;    There were no vitals filed for this visit.  Subjective Assessment - 07/07/18 1013    Subjective  Patient reports that he just took a pain pill, reports that he "feels weak" all over    Currently in Pain?  Yes    Pain Score  3     Pain Location  Knee                       [facility] Adult PT  Treatment/Exercise - 07/07/18 0001      Ambulation/Gait   Gait Comments  outside around the front parking island cues for step length and to decrease antalgic pattern as he reports no pain      Knee/Hip Exercises: Aerobic   Recumbent Bike  full revolutions seat position #8 some cues to decrease the hip hike x 6 minutes    Nustep  level 5 x 6 minutes      Knee/Hip Exercises: Standing   Knee Flexion  Left;2 sets;15 reps    Knee Flexion Limitations  5#    Hip Abduction  Left;2 sets;15 reps    Abduction Limitations  5#    Walking with Sports Cord  all directions, 2x50 feet      Knee/Hip Exercises: Seated   Long Arc Quad  Left;3 sets;10 reps    Long Arc Quad Weight  5 lbs.              PT Education - 07/07/18 1055    Education Details  reinforced the stretching    Person(s) Educated  Patient    Methods  Explanation;Demonstration;Tactile cues;Verbal cues    Comprehension  Verbalized understanding;Returned demonstration       PT Short Term Goals - 06/17/18 1008      PT SHORT TERM GOAL #1   Title  independent iwth initial HEP    Status  Achieved        PT Long Term Goals - 07/07/18 1056      PT LONG TERM GOAL #2   Title  walk with SPC all distances independently    Status  Partially Met      PT LONG TERM GOAL #3   Title  goup and down steps independently step over step    Status  Partially Met            Plan - 07/07/18 1054    Clinical Impression Statement  Patient continues to struggle with fatigue, he can tolerate the activity but needs rests especially with walking, with the strength on the 2nd and 3rd sets will require rest on the fifth rep    PT Next Visit Plan  write MD note       Patient will benefit from skilled therapeutic intervention in order to improve the following deficits and impairments:  Abnormal gait, Dizziness, Pain, Decreased scar mobility, Decreased mobility, Cardiopulmonary status limiting activity, Decreased activity tolerance, Decreased endurance, Decreased range of motion, Decreased strength, Impaired flexibility, Increased edema, Difficulty walking, Decreased balance  Visit Diagnosis: Acute pain of left knee  Stiffness of left knee, not elsewhere classified  Difficulty in walking, not elsewhere classified  Localized edema     Problem List Patient Active Problem List   Diagnosis Date Noted  . OA (osteoarthritis) of knee 06/06/2018  . Essential hypertension 09/07/2014  . Hyperlipidemia 09/07/2014  . Groin hematoma 04/06/2014  . Plantar fasciitis, bilateral 03/05/2014  . 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 Abnormal nuclear stress test 01/30/2014  . Coronary artery disease involving native coronary  artery of native heart with angina pectoris ([place]) 01/30/2014  . 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 Dyspnea on exertion 01/19/2014  . Gastroesophageal reflux 01/19/2014    Sumner Boast., PT 07/07/2018, 10:56 AM  [address] [place], [place], [address] Phone: [phone]   Fax:  [phone]  Name: AMARO MANGOLD MRN: [id number] Date of Birth: 1940/05/03

## 2018-07-12 ENCOUNTER — Ambulatory Visit: Payer: PPO | Admitting: Physical Therapy

## 2018-07-12 ENCOUNTER — Encounter: Payer: Self-pay | Admitting: Physical Therapy

## 2018-07-12 ENCOUNTER — Other Ambulatory Visit: Payer: Self-pay

## 2018-07-12 DIAGNOSIS — R262 Difficulty in walking, not elsewhere classified: Secondary | ICD-10-CM

## 2018-07-12 DIAGNOSIS — M25662 Stiffness of left knee, not elsewhere classified: Secondary | ICD-10-CM

## 2018-07-12 DIAGNOSIS — M25562 Pain in left knee: Secondary | ICD-10-CM

## 2018-07-12 DIAGNOSIS — D72829 Elevated white blood cell count, unspecified: Secondary | ICD-10-CM | POA: Diagnosis not present

## 2018-07-12 DIAGNOSIS — E785 Hyperlipidemia, unspecified: Secondary | ICD-10-CM | POA: Diagnosis not present

## 2018-07-12 DIAGNOSIS — Z20828 Contact with and (suspected) exposure to other viral communicable diseases: Secondary | ICD-10-CM | POA: Diagnosis not present

## 2018-07-12 DIAGNOSIS — Z96652 Presence of left artificial knee joint: Secondary | ICD-10-CM | POA: Diagnosis not present

## 2018-07-12 DIAGNOSIS — Z471 Aftercare following joint replacement surgery: Secondary | ICD-10-CM | POA: Diagnosis not present

## 2018-07-12 DIAGNOSIS — I10 Essential (primary) hypertension: Secondary | ICD-10-CM | POA: Diagnosis not present

## 2018-07-12 DIAGNOSIS — M25561 Pain in right knee: Secondary | ICD-10-CM | POA: Diagnosis not present

## 2018-07-12 NOTE — Therapy (Signed)
[place], [place], [address] Phone: [phone]   Fax:  [phone]  Physical Therapy Treatment  Patient Details  Name: Steven Yates MRN: [id number] Date of Birth: 02/23/1941 Referring Provider (PT): Aluisio   Encounter Date: 07/12/2018  PT End of Session - 07/12/18 1041    Visit Number  13    Date for PT Re-Evaluation  08/10/18    PT Start Time  1011    PT Stop Time  1055    PT Time Calculation (min)  44 min    Activity Tolerance  Patient tolerated treatment well;Patient limited by fatigue    Behavior During Therapy  [facility] for tasks assessed/performed       Past Medical History:  Diagnosis Date  . Arthritis   . Coronary artery disease    03-2014 2 stents  . Diabetes mellitus without complication ([place])    type 2  . Gastroesophageal reflux 01/19/2014  . Gout   . HTN (hypertension)   . Hypercholesterolemia   . 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 BACK SURGERY     x2  . CARDIAC CATHETERIZATION  01/2014   Dr. Tamala Julian  . CARDIAC CATHETERIZATION  04/04/2014   Procedure: CORONARY/BYPASS GRAFT CTO INTERVENTION;  Surgeon: Peter M Martinique, MD;  Location: [facility] CATH LAB;  Service: Cardiovascular;;  . CARPAL TUNNEL RELEASE     bil   2019  . CHOLECYSTECTOMY  2010  . CORONARY ANGIOPLASTY WITH STENT PLACEMENT  04/04/2014   "2"  . INGUINAL HERNIA REPAIR Bilateral 1966?  . INTRAVASCULAR PRESSURE WIRE/FFR STUDY N/A 09/01/2016   Procedure: Intravascular Pressure Wire/FFR Study;  Surgeon: Belva Crome, MD;  Location: [place] CV LAB;  Service: Cardiovascular;  Laterality: N/A;  . LEFT HEART CATH AND CORONARY ANGIOGRAPHY N/A 09/01/2016   Procedure: Left Heart Cath and Coronary Angiography;  Surgeon: Belva Crome, MD;  Location: [place] CV LAB;  Service: Cardiovascular;  Laterality: N/A;  . LEFT  HEART CATHETERIZATION WITH CORONARY ANGIOGRAM N/A 01/30/2014   Procedure: LEFT HEART CATHETERIZATION WITH CORONARY ANGIOGRAM;  Surgeon: Sinclair Grooms, MD;  Location: [facility] CATH LAB;  Service: Cardiovascular;  Laterality: N/A;  . LUMBAR [place] SURGERY  1980's X 2   "ruptured disc"  . PERCUTANEOUS CORONARY STENT INTERVENTION (PCI-S)  01/30/2014   Procedure: PERCUTANEOUS CORONARY STENT INTERVENTION (PCI-S);  Surgeon: Sinclair Grooms, MD;  Location: [facility] CATH LAB;  Service: Cardiovascular;;  . SHOULDER OPEN ROTATOR CUFF REPAIR Right 1990's  . TOTAL KNEE ARTHROPLASTY Left 06/06/2018   Procedure: TOTAL KNEE ARTHROPLASTY;  Surgeon: Gaynelle Arabian, MD;  Location: [facility] ORS;  Service: Orthopedics;  Laterality: Left;  67mn  . ULTRASOUND GUIDANCE FOR VASCULAR ACCESS  09/01/2016   Procedure: Ultrasound Guidance For Vascular Access;  Surgeon: SBelva Crome MD;  [facility];  Service: Cardiovascular;;    There were no vitals filed for this visit.  Subjective Assessment - 07/12/18 1013    Subjective  Patient reports that he is having issues with tightness and some increased pain at night, "can't get comfortable"    Currently in Pain?  Yes    Pain Score  6     Pain Location  Knee    Pain Orientation  Left    Pain Descriptors / Indicators  Tightness;Aching    Aggravating Factors  walking, bending, trying to sleep         [facility] PT Assessment - 07/12/18 0001      AROM   AROM Assessment Site  Knee    Right/Left Knee  Left    Left Knee Extension  8    Left Knee Flexion  110      PROM   PROM Assessment Site  Knee    Right/Left Knee  Left    Left Knee Extension  5    Left Knee Flexion  115                   [facility] Adult PT Treatment/Exercise - 07/12/18 0001      Ambulation/Gait   Gait Comments  outside [facility], cues to decrease the antalgic gait and bend the knee, he tends to go back to a stiff legged gait and antalgic without cues, could be a habit      Knee/Hip Exercises:  Aerobic   Recumbent Bike  full revolutions seat position #8 some cues to decrease the hip hike x 6 minutes    Nustep  level 5 x 7 minutes      Knee/Hip Exercises: Standing   Heel Raises  2 sets;15 reps;1 second    Knee Flexion  Left;2 sets;15 reps    Knee Flexion Limitations  5#    Hip Abduction  Left;2 sets;15 reps    Forward Step Up  Step Height: 4";2 sets;10 reps;Step Height: 6"      Knee/Hip Exercises: Seated   Long Arc Quad  Left;3 sets;10 reps    Long Arc Quad Weight  5 lbs.      Knee/Hip Exercises: Supine   Quad Sets  Left;3 sets;10 reps    Quad Sets Limitations  3# with cues for TKE    Heel Prop for Knee Extension  2 minutes    Heel Prop for Knee Extension Limitations  painful      Manual Therapy   Manual Therapy  Passive ROM    Passive ROM  flexion and extension, he is resistant to PROM at the end ranges, tried some contract relax and it did a little better.               PT Short Term Goals - 06/17/18 1008      PT SHORT TERM GOAL #1   Title  independent iwth initial HEP    Status  Achieved        PT Long Term Goals - 07/12/18 1044      PT LONG TERM GOAL #1   Title  transfer independent    Status  Achieved      PT LONG TERM GOAL #2   Title  walk with SPC all distances independently    Status  Partially Met      PT LONG TERM GOAL #3   Title  goup and down steps independently step over step    Status  Partially Met      PT LONG TERM GOAL #4   Title  decrease pain 50%    Status  Partially Met      PT LONG TERM GOAL #5   Title  increase AROM to 5-110 degrees flexion    Status  Partially Met            Plan - 07/12/18 1042    Clinical Impression Statement  Overall patient is doing well, he struggles with fatigue especially with walking.  His ROM is improving but  his walk is now with a SPC, he tends to be antalgic and very stiff on the left, he can change this with verbal cues but regresses as he goes, seems to be a habit    PT Next Visit  Plan  due to the Covid - 19 we have decreased to 2x/week, will continue to push ROM and fucntion    Consulted and Agree with Plan of Care  Patient       Patient will benefit from skilled therapeutic intervention in order to improve the following deficits and impairments:  Abnormal gait, Dizziness, Pain, Decreased scar mobility, Decreased mobility, Cardiopulmonary status limiting activity, Decreased activity tolerance, Decreased endurance, Decreased range of motion, Decreased strength, Impaired flexibility, Increased edema, Difficulty walking, Decreased balance  Visit Diagnosis: Acute pain of left knee  Stiffness of left knee, not elsewhere classified  Difficulty in walking, not elsewhere classified     Problem List Patient Active Problem List   Diagnosis Date Noted  . OA (osteoarthritis) of knee 06/06/2018  . Essential hypertension 09/07/2014  . Hyperlipidemia 09/07/2014  . Groin hematoma 04/06/2014  . Plantar fasciitis, bilateral 03/05/2014  . 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 Abnormal nuclear stress test 01/30/2014  . Coronary artery disease involving native coronary artery of native heart with angina pectoris ([place]) 01/30/2014  . 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 Dyspnea on exertion 01/19/2014  . Gastroesophageal reflux 01/19/2014    Sumner Boast., PT 07/12/2018, 10:45 AM  [place], [place], [address] Phone: [phone]   Fax:  [phone]  Name: Steven Yates MRN: [id number] Date of Birth: 1940/05/15

## 2018-07-14 ENCOUNTER — Ambulatory Visit: Payer: PPO | Admitting: Physical Therapy

## 2018-07-14 ENCOUNTER — Encounter: Payer: Self-pay | Admitting: Physical Therapy

## 2018-07-14 ENCOUNTER — Other Ambulatory Visit: Payer: Self-pay

## 2018-07-14 DIAGNOSIS — M25662 Stiffness of left knee, not elsewhere classified: Secondary | ICD-10-CM

## 2018-07-14 DIAGNOSIS — M25562 Pain in left knee: Secondary | ICD-10-CM

## 2018-07-14 DIAGNOSIS — R262 Difficulty in walking, not elsewhere classified: Secondary | ICD-10-CM

## 2018-07-14 NOTE — Therapy (Signed)
[place], [place], [address] Phone: [phone]   Fax:  [phone]  Physical Therapy Treatment  Patient Details  Name: Steven Yates MRN: [id number] Date of Birth: [birth year] Referring Provider (PT): Aluisio   Encounter Date: 07/14/2018  PT End of Session - 07/14/18 1141    Visit Number  14    Date for PT Re-Evaluation  08/10/18    PT Start Time  1055    PT Stop Time  1145    PT Time Calculation (min)  50 min    Activity Tolerance  Patient tolerated treatment well;Patient limited by fatigue    Behavior During Therapy  [facility] for tasks assessed/performed       Past Medical History:  Diagnosis Date  . Arthritis   . Coronary artery disease    03-2014 2 stents  . Diabetes mellitus without complication ([place])    type 2  . Gastroesophageal reflux 01/19/2014  . Gout   . HTN (hypertension)   . Hypercholesterolemia   . 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 BACK SURGERY     x2  . CARDIAC CATHETERIZATION  01/2014   Dr. Tamala Julian  . CARDIAC CATHETERIZATION  04/04/2014   Procedure: CORONARY/BYPASS GRAFT CTO INTERVENTION;  Surgeon: Peter M Martinique, MD;  Location: [facility] CATH LAB;  Service: Cardiovascular;;  . CARPAL TUNNEL RELEASE     bil   2019  . CHOLECYSTECTOMY  2010  . CORONARY ANGIOPLASTY WITH STENT PLACEMENT  04/04/2014   "2"  . INGUINAL HERNIA REPAIR Bilateral 1966?  . INTRAVASCULAR PRESSURE WIRE/FFR STUDY N/A 09/01/2016   Procedure: Intravascular Pressure Wire/FFR Study;  Surgeon: Belva Crome, MD;  Location: [place] CV LAB;  Service: Cardiovascular;  Laterality: N/A;  . LEFT HEART CATH AND CORONARY ANGIOGRAPHY N/A 09/01/2016   Procedure: Left Heart Cath and Coronary Angiography;  Surgeon: Belva Crome, MD;  Location: [place] CV LAB;  Service: Cardiovascular;  Laterality: N/A;  . LEFT  HEART CATHETERIZATION WITH CORONARY ANGIOGRAM N/A 01/30/2014   Procedure: LEFT HEART CATHETERIZATION WITH CORONARY ANGIOGRAM;  Surgeon: Sinclair Grooms, MD;  Location: [facility] CATH LAB;  Service: Cardiovascular;  Laterality: N/A;  . LUMBAR [place] SURGERY  1980's X 2   "ruptured disc"  . PERCUTANEOUS CORONARY STENT INTERVENTION (PCI-S)  01/30/2014   Procedure: PERCUTANEOUS CORONARY STENT INTERVENTION (PCI-S);  Surgeon: Sinclair Grooms, MD;  Location: [facility] CATH LAB;  Service: Cardiovascular;;  . SHOULDER OPEN ROTATOR CUFF REPAIR Right 1990's  . TOTAL KNEE ARTHROPLASTY Left 06/06/2018   Procedure: TOTAL KNEE ARTHROPLASTY;  Surgeon: Gaynelle Arabian, MD;  [facility]S;  Service: Orthopedics;  Laterality: Left;  19mn  . ULTRASOUND GUIDANCE FOR VASCULAR ACCESS  09/01/2016   Procedure: Ultrasound Guidance For Vascular Access;  Surgeon: SBelva Crome MD;  [facility];  Service: Cardiovascular;;    There were no vitals filed for this visit.  Subjective Assessment - 07/14/18 1050    Subjective  Patient reports that he saw the surgeon, surgeon was pleased with the ROM.  Feels like he will continue through the month    Currently in Pain?  Yes    Pain Score  4     Pain Location  Knee    Pain Orientation  Left    Pain Descriptors / Indicators  Tightness  [facility] Adult PT Treatment/Exercise - 07/14/18 0001      Ambulation/Gait   Gait Comments  outside no device, around the building , down stairs step over step and then another half around the building      Knee/Hip Exercises: Aerobic   Recumbent Bike  full revolutions seat position #8 some cues to decrease the hip hike x 6 minutes    Nustep  level 5 x 7 minutes      Knee/Hip Exercises: Standing   Forward Step Up  Step Height: 4";2 sets;10 reps;Step Height: 6"    Walking with Sports Cord  all directions, 2x50 feet      Knee/Hip Exercises: Seated   Long Arc Quad  Left;3 sets;10 reps    Long Arc Quad  Weight  5 lbs.    Hamstring Curl  3 sets;10 reps;Left    Hamstring Limitations  standing 5#      Knee/Hip Exercises: Supine   Quad Sets  Left;3 sets;10 reps    Quad Sets Limitations  3#    Terminal Knee Extension  Left;20 reps    Heel Prop for Knee Extension  2 minutes    Heel Prop for Knee Extension Limitations  cue sto keep foot straight up      Manual Therapy   Manual Therapy  Passive ROM    Passive ROM  today I focused on extension               PT Short Term Goals - 06/17/18 1008      PT SHORT TERM GOAL #1   Title  independent iwth initial HEP    Status  Achieved        PT Long Term Goals - 07/12/18 1044      PT LONG TERM GOAL #1   Title  transfer independent    Status  Achieved      PT LONG TERM GOAL #2   Title  walk with SPC all distances independently    Status  Partially Met      PT LONG TERM GOAL #3   Title  goup and down steps independently step over step    Status  Partially Met      PT LONG TERM GOAL #4   Title  decrease pain 50%    Status  Partially Met      PT LONG TERM GOAL #5   Title  increase AROM to 5-110 degrees flexion    Status  Partially Met            Plan - 07/14/18 1142    Clinical Impression Statement  Patient walking much better today, he continues to fatigue easily, he does not like the passive stretch, the flexion is doing well so I focused on extension today and this was painful.    PT Next Visit Plan  continue over the next few weeks to maximize ROM and function    Consulted and Agree with Plan of Care  Patient       Patient will benefit from skilled therapeutic intervention in order to improve the following deficits and impairments:  Abnormal gait, Dizziness, Pain, Decreased scar mobility, Decreased mobility, Cardiopulmonary status limiting activity, Decreased activity tolerance, Decreased endurance, Decreased range of motion, Decreased strength, Impaired flexibility, Increased edema, Difficulty walking, Decreased  balance  Visit Diagnosis: Acute pain of left knee  Stiffness of left knee, not elsewhere classified  Difficulty in walking, not elsewhere classified     Problem List Patient Active Problem List  Diagnosis Date Noted  . OA (osteoarthritis) of knee 06/06/2018  . Essential hypertension 09/07/2014  . Hyperlipidemia 09/07/2014  . Groin hematoma 04/06/2014  . Plantar fasciitis, bilateral 03/05/2014  . 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 Abnormal nuclear stress test 01/30/2014  . Coronary artery disease involving native coronary artery of native heart with angina pectoris ([place]) 01/30/2014  . 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 Dyspnea on exertion 01/19/2014  . Gastroesophageal reflux 01/19/2014    Sumner Boast 07/14/2018, 11:44 AM., PT  [facility] [place] [address] [place], [place], [address] Phone: [phone]   Fax:  [phone]  Name: TOBIE HELLEN MRN: [id number] Date of Birth: 05-10-1940

## 2018-07-19 ENCOUNTER — Ambulatory Visit: Payer: PPO | Admitting: Physical Therapy

## 2018-07-21 ENCOUNTER — Encounter: Payer: Self-pay | Admitting: Physical Therapy

## 2018-07-21 ENCOUNTER — Other Ambulatory Visit: Payer: Self-pay

## 2018-07-21 ENCOUNTER — Ambulatory Visit: Payer: PPO | Admitting: Physical Therapy

## 2018-07-21 DIAGNOSIS — M25562 Pain in left knee: Secondary | ICD-10-CM | POA: Diagnosis not present

## 2018-07-21 DIAGNOSIS — M25662 Stiffness of left knee, not elsewhere classified: Secondary | ICD-10-CM

## 2018-07-21 DIAGNOSIS — R262 Difficulty in walking, not elsewhere classified: Secondary | ICD-10-CM

## 2018-07-21 NOTE — Therapy (Signed)
[place], [place], [address] Phone: [phone]   Fax:  [phone]  Physical Therapy Treatment  Patient Details  Name: Steven Yates MRN: [id number] Date of Birth: 1940/11/12 Referring Provider (PT): Aluisio   Encounter Date: 07/21/2018  PT End of Session - 07/21/18 1051    Visit Number  15    Date for PT Re-Evaluation  08/10/18    PT Start Time  1009    PT Stop Time  1048    PT Time Calculation (min)  39 min    Activity Tolerance  Patient tolerated treatment well    Behavior During Therapy  [facility] for tasks assessed/performed       Past Medical History:  Diagnosis Date  . Arthritis   . Coronary artery disease    03-2014 2 stents  . Diabetes mellitus without complication ([place])    type 2  . Gastroesophageal reflux 01/19/2014  . Gout   . HTN (hypertension)   . Hypercholesterolemia   . 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 BACK SURGERY     x2  . CARDIAC CATHETERIZATION  01/2014   Dr. Tamala Julian  . CARDIAC CATHETERIZATION  04/04/2014   Procedure: CORONARY/BYPASS GRAFT CTO INTERVENTION;  Surgeon: Peter M Martinique, MD;  Location: [facility] CATH LAB;  Service: Cardiovascular;;  . CARPAL TUNNEL RELEASE     bil   2019  . CHOLECYSTECTOMY  2010  . CORONARY ANGIOPLASTY WITH STENT PLACEMENT  04/04/2014   "2"  . INGUINAL HERNIA REPAIR Bilateral 1966?  . INTRAVASCULAR PRESSURE WIRE/FFR STUDY N/A 09/01/2016   Procedure: Intravascular Pressure Wire/FFR Study;  Surgeon: Belva Crome, MD;  Location: [place] CV LAB;  Service: Cardiovascular;  Laterality: N/A;  . LEFT HEART CATH AND CORONARY ANGIOGRAPHY N/A 09/01/2016   Procedure: Left Heart Cath and Coronary Angiography;  Surgeon: Belva Crome, MD;  Location: [place] CV LAB;  Service: Cardiovascular;  Laterality: N/A;  . LEFT HEART CATHETERIZATION WITH  CORONARY ANGIOGRAM N/A 01/30/2014   Procedure: LEFT HEART CATHETERIZATION WITH CORONARY ANGIOGRAM;  Surgeon: Sinclair Grooms, MD;  Location: [facility] CATH LAB;  Service: Cardiovascular;  Laterality: N/A;  . LUMBAR [place] SURGERY  1980's X 2   "ruptured disc"  . PERCUTANEOUS CORONARY STENT INTERVENTION (PCI-S)  01/30/2014   Procedure: PERCUTANEOUS CORONARY STENT INTERVENTION (PCI-S);  Surgeon: Sinclair Grooms, MD;  Location: [facility] CATH LAB;  Service: Cardiovascular;;  . SHOULDER OPEN ROTATOR CUFF REPAIR Right 1990's  . TOTAL KNEE ARTHROPLASTY Left 06/06/2018   Procedure: TOTAL KNEE ARTHROPLASTY;  Surgeon: Gaynelle Arabian, MD;  [facility];  Service: Orthopedics;  Laterality: Left;  32min  . ULTRASOUND GUIDANCE FOR VASCULAR ACCESS  09/01/2016   Procedure: Ultrasound Guidance For Vascular Access;  Surgeon: Belva Crome, MD;  Location: [place] CV LAB;  Service: Cardiovascular;;    There were no vitals filed for this visit.  Subjective Assessment - 07/21/18 1015    Subjective  Patient reports that he had to cancel earlier in the week, reports that he has had some other health issues.  Feels like he is doing okay, reports that he is still stiff and weak    Currently in Pain?  Yes    Pain Score  2     Pain Location  Knee    Pain Orientation  Left  Pain Descriptors / Indicators  Tightness    Aggravating Factors   activity, c/o mostly stiffness         [facility] PT Assessment - 07/21/18 0001      AROM   Left Knee Extension  5    Left Knee Flexion  115                   [facility] Adult PT Treatment/Exercise - 07/21/18 0001      High Level Balance   High Level Balance Activities  Side stepping;Backward walking;Tandem walking    High Level Balance Comments  has difficulty with tandem walk      Knee/Hip Exercises: Aerobic   Recumbent Bike  full revolutions seat position #8 some cues to decrease the hip hike x 6 minutes    Nustep  level 5 x 7 minutes      Knee/Hip Exercises: Standing    Forward Step Up  Step Height: 4";10 reps;Step Height: 6";Left;3 sets    Forward Step Up Limitations  needs a lot of cues due to weakness, also has some poor habits    Other Standing Knee Exercises  12" toe clears    Other Standing Knee Exercises  ball kicks      Knee/Hip Exercises: Seated   Long Arc Quad  Left;3 sets;10 reps    Long Arc Quad Weight  5 lbs.    Hamstring Curl  3 sets;10 reps;Left    Hamstring Limitations  standing 5#    Sit to Sand  without UE support;10 reps               PT Short Term Goals - 06/17/18 1008      PT SHORT TERM GOAL #1   Title  independent iwth initial HEP    Status  Achieved        PT Long Term Goals - 07/21/18 1054      PT LONG TERM GOAL #2   Title  walk with SPC all distances independently    Status  Achieved            Plan - 07/21/18 1051    Clinical Impression Statement  Patient has increased ROM, is having difficulty with steps, walking and balance, the walking and the stairs seems to be a habit, or weakness, as he does not c/o pain, he really has to throw himself up for the steps and twith the LAQ's  .  He gets short of breath quickly.    PT Next Visit Plan  will look to decrease to 1x/week    Consulted and Agree with Plan of Care  Patient       Patient will benefit from skilled therapeutic intervention in order to improve the following deficits and impairments:  Abnormal gait, Dizziness, Pain, Decreased scar mobility, Decreased mobility, Cardiopulmonary status limiting activity, Decreased activity tolerance, Decreased endurance, Decreased range of motion, Decreased strength, Impaired flexibility, Increased edema, Difficulty walking, Decreased balance  Visit Diagnosis: Acute pain of left knee  Stiffness of left knee, not elsewhere classified  Difficulty in walking, not elsewhere classified     Problem List Patient Active Problem List   Diagnosis Date Noted  . OA (osteoarthritis) of knee 06/06/2018  .  Essential hypertension 09/07/2014  . Hyperlipidemia 09/07/2014  . Groin hematoma 04/06/2014  . Plantar fasciitis, bilateral 03/05/2014  . 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 Abnormal nuclear stress test 01/30/2014  . Coronary artery disease involving native coronary artery of native heart with angina pectoris (  [place]) 01/30/2014  . 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 Dyspnea on exertion 01/19/2014  . Gastroesophageal reflux 01/19/2014    Sumner Boast., PT 07/21/2018, 10:54 AM  [place] [address] [place], [place], [address] Phone: [phone]   Fax:  [phone]  Name: JOHNATHA ZEIDMAN MRN: [id number] Date of Birth: 06/17/1940

## 2018-07-26 ENCOUNTER — Ambulatory Visit: Payer: PPO | Admitting: Physical Therapy

## 2018-07-26 ENCOUNTER — Encounter: Payer: Self-pay | Admitting: Physical Therapy

## 2018-07-26 ENCOUNTER — Other Ambulatory Visit: Payer: Self-pay

## 2018-07-26 DIAGNOSIS — M25562 Pain in left knee: Secondary | ICD-10-CM

## 2018-07-26 DIAGNOSIS — R262 Difficulty in walking, not elsewhere classified: Secondary | ICD-10-CM

## 2018-07-26 DIAGNOSIS — M25662 Stiffness of left knee, not elsewhere classified: Secondary | ICD-10-CM

## 2018-07-26 NOTE — Therapy (Signed)
[place], [place], [address] Phone: [phone]   Fax:  [phone]  Physical Therapy Treatment  Patient Details  Name: Steven Yates MRN: [id number] Date of Birth: April 06, 1940 Referring Provider (PT): Aluisio   Encounter Date: 07/26/2018  PT End of Session - 07/26/18 1041    Visit Number  16    Date for PT Re-Evaluation  08/10/18    PT Start Time  1000    PT Stop Time  1042    PT Time Calculation (min)  42 min    Activity Tolerance  Patient tolerated treatment well    Behavior During Therapy  [facility] for tasks assessed/performed       Past Medical History:  Diagnosis Date  . Arthritis   . Coronary artery disease    03-2014 2 stents  . Diabetes mellitus without complication ([place])    type 2  . Gastroesophageal reflux 01/19/2014  . Gout   . HTN (hypertension)   . Hypercholesterolemia   . 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 BACK SURGERY     x2  . CARDIAC CATHETERIZATION  01/2014   Dr. Tamala Julian  . CARDIAC CATHETERIZATION  04/04/2014   Procedure: CORONARY/BYPASS GRAFT CTO INTERVENTION;  Surgeon: Peter M Martinique, MD;  Location: [facility] CATH LAB;  Service: Cardiovascular;;  . CARPAL TUNNEL RELEASE     bil   2019  . CHOLECYSTECTOMY  2010  . CORONARY ANGIOPLASTY WITH STENT PLACEMENT  04/04/2014   "2"  . INGUINAL HERNIA REPAIR Bilateral 1966?  . INTRAVASCULAR PRESSURE WIRE/FFR STUDY N/A 09/01/2016   Procedure: Intravascular Pressure Wire/FFR Study;  Surgeon: Belva Crome, MD;  Location: [place] CV LAB;  Service: Cardiovascular;  Laterality: N/A;  . LEFT HEART CATH AND CORONARY ANGIOGRAPHY N/A 09/01/2016   Procedure: Left Heart Cath and Coronary Angiography;  Surgeon: Belva Crome, MD;  Location: [place] CV LAB;  Service: Cardiovascular;  Laterality: N/A;  . LEFT HEART CATHETERIZATION WITH  CORONARY ANGIOGRAM N/A 01/30/2014   Procedure: LEFT HEART CATHETERIZATION WITH CORONARY ANGIOGRAM;  Surgeon: Sinclair Grooms, MD;  Location: [facility] CATH LAB;  Service: Cardiovascular;  Laterality: N/A;  . LUMBAR [place] SURGERY  1980's X 2   "ruptured disc"  . PERCUTANEOUS CORONARY STENT INTERVENTION (PCI-S)  01/30/2014   Procedure: PERCUTANEOUS CORONARY STENT INTERVENTION (PCI-S);  Surgeon: Sinclair Grooms, MD;  Location: [facility] CATH LAB;  Service: Cardiovascular;;  . SHOULDER OPEN ROTATOR CUFF REPAIR Right 1990's  . TOTAL KNEE ARTHROPLASTY Left 06/06/2018   Procedure: TOTAL KNEE ARTHROPLASTY;  Surgeon: Gaynelle Arabian, MD;  Location: [facility];  Service: Orthopedics;  Laterality: Left;  23mn  . ULTRASOUND GUIDANCE FOR VASCULAR ACCESS  09/01/2016   Procedure: Ultrasound Guidance For Vascular Access;  Surgeon: SBelva Crome MD;  [place];  Service: Cardiovascular;;    There were no vitals filed for this visit.  Subjective Assessment - 07/26/18 0958    Subjective  Patient reports that he is doing okay.  Reports a little pain on a daily basis, stiff after sitting    Currently in Pain?  Yes    Pain Score  2     Pain Location  Knee    Pain Orientation  Left    Aggravating Factors   after sitting, stiff  [place] Adult PT Treatment/Exercise - 07/26/18 0001      Ambulation/Gait   Gait Comments  up and down stairs step over step, on the descent he still has difficulty with control.  up slopes and around building he does get short of breath, with at times a stiff legged gait and limp      High Level Balance   High Level Balance Activities  Side stepping;Backward walking;Tandem walking      Knee/Hip Exercises: Stretches   Press photographer  Both;3 reps;20 seconds      Knee/Hip Exercises: Aerobic   Recumbent Bike  full revolutions seat position #8 some cues to decrease the hip hike x 6 minutes    Nustep  level 6 x 7 minutes, no arms      Knee/Hip  Exercises: Standing   Heel Raises  2 sets;15 reps;1 second    Knee Flexion  Left;2 sets;15 reps    Knee Flexion Limitations  5#    Hip Abduction  Left;2 sets;15 reps    Abduction Limitations  5#    Walking with Sports Cord  all directions, 2x50 feet      Knee/Hip Exercises: Seated   Long Arc Quad  Left;3 sets;10 reps    Long Arc Quad Weight  5 lbs.    Long [employer] Limitations  a lot of cues to slow down and get TKE    Sit to [employer]  without UE support;10 reps               PT Short Term Goals - 06/17/18 1008      PT SHORT TERM GOAL #1   Title  independent iwth initial HEP    Status  Achieved        PT Long Term Goals - 07/26/18 1042      PT LONG TERM GOAL #1   Title  transfer independent    Status  Achieved      PT LONG TERM GOAL #2   Title  walk with SPC all distances independently    Status  Achieved      PT LONG TERM GOAL #3   Title  goup and down steps independently step over step    Status  Partially Met      PT LONG TERM GOAL #4   Title  decrease pain 50%    Status  Achieved      PT LONG TERM GOAL #5   Title  increase AROM to 5-110 degrees flexion    Status  Partially Met            Plan - 07/26/18 1041    Clinical Impression Statement  Patient still with difficulty controlling descent on stairs, he fatigues quickly and gets short of breath with walking and other aerobic activities.  He requires a lot of cues to slow down on the exercises and actually activate mms at end ranges.    PT Next Visit Plan  we have decreased to 1x/week and will assess whether he can continue on his own    Consulted and Agree with Plan of Care  Patient       Patient will benefit from skilled therapeutic intervention in order to improve the following deficits and impairments:     Visit Diagnosis: Acute pain of left knee  Stiffness of left knee, not elsewhere classified  Difficulty in walking, not elsewhere classified     Problem List Patient Active  Problem List   Diagnosis Date Noted  .  OA (osteoarthritis) of knee 06/06/2018  . Essential hypertension 09/07/2014  . Hyperlipidemia 09/07/2014  . Groin hematoma 04/06/2014  . Plantar fasciitis, bilateral 03/05/2014  . 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 Abnormal nuclear stress test 01/30/2014  . Coronary artery disease involving native coronary artery of native heart with angina pectoris ([place]) 01/30/2014  . 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 Dyspnea on exertion 01/19/2014  . Gastroesophageal reflux 01/19/2014    Sumner Boast., PT 07/26/2018, 10:43 AM  [place] [address] [place], [place], [address] Phone: [phone]   Fax:  [phone]  Name: Steven Yates MRN: [id number] Date of Birth: November 12, 1940

## 2018-08-03 ENCOUNTER — Telehealth: Payer: Self-pay | Admitting: Interventional Cardiology

## 2018-08-03 ENCOUNTER — Ambulatory Visit: Payer: PPO | Admitting: Physical Therapy

## 2018-08-03 MED ORDER — NITROGLYCERIN 0.4 MG SL SUBL: 0.4000 mg | SUBLINGUAL_TABLET | SUBLINGUAL | 3 refills | Status: DC | PRN

## 2018-08-03 NOTE — Telephone Encounter (Signed)
Is he SOB or having chest burning or both? He does have CAD. He should try SL NTG to see if helps dramatically. He should report result. If NTG helps but it returns quickly or with activity, may need to go to ER.

## 2018-08-03 NOTE — Telephone Encounter (Signed)
Pt c/o Shortness Of Breath: STAT if SOB developed within the last 24 hours or pt is noticeably SOB on the phone  1. Are you currently SOB (can you hear that pt is SOB on the phone)?  A little now. 2. How long have you been experiencing SOB? A few days.  3. Are you SOB when sitting or when up moving around? Moving around  4. Are you currently experiencing any other symptoms? No

## 2018-08-03 NOTE — Telephone Encounter (Signed)
Spoke with pt and made him aware of recommendations.  He is not having SOB.  Pt will pick up Nitro and see if it helps.

## 2018-08-03 NOTE — Telephone Encounter (Signed)
Pt developed chest discomfort over the last few days.  Denies pain or radiation, more of a burning sensation.  Denies SOB, dizziness, lightheadedness, swelling.  Does have some fatigue.  No vitals available.  Feels like indigestion.  Took something for it this morning and it helped a little but came right back not long after.  Walks around outside with no issues.  Advised I would send message to Dr. Tamala Julian for review and advisement.

## 2018-08-04 ENCOUNTER — Ambulatory Visit: Payer: PPO | Attending: Orthopedic Surgery | Admitting: Physical Therapy

## 2018-08-04 ENCOUNTER — Encounter: Payer: Self-pay | Admitting: Physical Therapy

## 2018-08-04 ENCOUNTER — Other Ambulatory Visit: Payer: Self-pay

## 2018-08-04 DIAGNOSIS — M25662 Stiffness of left knee, not elsewhere classified: Secondary | ICD-10-CM | POA: Diagnosis not present

## 2018-08-04 DIAGNOSIS — M25562 Pain in left knee: Secondary | ICD-10-CM | POA: Diagnosis not present

## 2018-08-04 DIAGNOSIS — R262 Difficulty in walking, not elsewhere classified: Secondary | ICD-10-CM | POA: Diagnosis not present

## 2018-08-04 NOTE — Therapy (Signed)
[place], [place], [address] Phone: [phone]   Fax:  [phone]  Physical Therapy Treatment  Patient Details  Name: Steven Yates MRN: [id number] Date of Birth: 07-19-1940 Referring Provider (PT): Aluisio   Encounter Date: 08/04/2018  PT End of Session - 08/04/18 1035    Visit Number  17    Date for PT Re-Evaluation  08/10/18    PT Start Time  0925    PT Stop Time  1015    PT Time Calculation (min)  50 min    Activity Tolerance  Patient tolerated treatment well    Behavior During Therapy  [facility] for tasks assessed/performed       Past Medical History:  Diagnosis Date  . Arthritis   . Coronary artery disease    03-2014 2 stents  . Diabetes mellitus without complication ([place])    type 2  . Gastroesophageal reflux 01/19/2014  . Gout   . HTN (hypertension)   . Hypercholesterolemia   . 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 BACK SURGERY     x2  . CARDIAC CATHETERIZATION  01/2014   Dr. Tamala Julian  . CARDIAC CATHETERIZATION  04/04/2014   Procedure: CORONARY/BYPASS GRAFT CTO INTERVENTION;  Surgeon: Peter M Martinique, MD;  Location: [facility] CATH LAB;  Service: Cardiovascular;;  . CARPAL TUNNEL RELEASE     bil   2019  . CHOLECYSTECTOMY  2010  . CORONARY ANGIOPLASTY WITH STENT PLACEMENT  04/04/2014   "2"  . INGUINAL HERNIA REPAIR Bilateral 1966?  . INTRAVASCULAR PRESSURE WIRE/FFR STUDY N/A 09/01/2016   Procedure: Intravascular Pressure Wire/FFR Study;  Surgeon: Belva Crome, MD;  Location: [place] CV LAB;  Service: Cardiovascular;  Laterality: N/A;  . LEFT HEART CATH AND CORONARY ANGIOGRAPHY N/A 09/01/2016   Procedure: Left Heart Cath and Coronary Angiography;  Surgeon: Belva Crome, MD;  Location: [place] CV LAB;  Service: Cardiovascular;  Laterality: N/A;  . LEFT HEART CATHETERIZATION WITH  CORONARY ANGIOGRAM N/A 01/30/2014   Procedure: LEFT HEART CATHETERIZATION WITH CORONARY ANGIOGRAM;  Surgeon: Sinclair Grooms, MD;  Location: [facility] CATH LAB;  Service: Cardiovascular;  Laterality: N/A;  . LUMBAR [place] SURGERY  1980's X 2   "ruptured disc"  . PERCUTANEOUS CORONARY STENT INTERVENTION (PCI-S)  01/30/2014   Procedure: PERCUTANEOUS CORONARY STENT INTERVENTION (PCI-S);  Surgeon: Sinclair Grooms, MD;  Location[facility] CATH LAB;  Service: Cardiovascular;;  . SHOULDER OPEN ROTATOR CUFF REPAIR Right 1990's  . TOTAL KNEE ARTHROPLASTY Left 06/06/2018   Procedure: TOTAL KNEE ARTHROPLASTY;  Surgeon: Gaynelle Arabian, MD;  [facility];  Service: Orthopedics;  Laterality: Left;  65mn  . ULTRASOUND GUIDANCE FOR VASCULAR ACCESS  09/01/2016   Procedure: Ultrasound Guidance For Vascular Access;  Surgeon: SBelva Crome MD;  [facility];  Service: Cardiovascular;;    There were no vitals filed for this visit.  Subjective Assessment - 08/04/18 0955    Subjective  Patient reports that he is working and feeling pretty good with his activity and reports much less pain.  He is pleased with where he is at, has some questions about the recovery process.    Currently in Pain?  No/denies                       [facility]  [facility]/Exercise - 08/04/18 0001      Ambulation/Gait   Gait Comments  up and down stairs step over step, on the descent he still has difficulty with control.  up slopes and around building he does get short of breath, he is limping more on the right leg      Knee/Hip Exercises: Aerobic   Recumbent Bike  full revolutions seat #8    Nustep  level 6 x 7 minutes, no arms      Knee/Hip Exercises: Standing   Heel Raises  2 sets;15 reps;1 second    Knee Flexion  Left;2 sets;15 reps    Knee Flexion Limitations  5#, cues to get TKE and go slow    Hip Abduction  Left;2 sets;15 reps    Abduction Limitations  5#    Other Standing Knee Exercises  ball kicks       Knee/Hip Exercises: Seated   Long Arc Quad  Left;3 sets;10 reps    Long Arc Quad Weight  5 lbs.    Long [employer] Limitations  a lot of cues to slow down and get TKE    Hamstring Curl  3 sets;10 reps;Left    Hamstring Limitations  standing 5#             PT Education - 08/04/18 1034    Education Details  went over what he is to do for his exercises if we do not see him    Person(s) Educated  Patient    Methods  Explanation;Demonstration;Verbal cues    Comprehension  Verbalized understanding       PT Short Term Goals - 06/17/18 1008      PT SHORT TERM GOAL #1   Title  independent iwth initial HEP    Status  Achieved        PT Long Term Goals - 08/04/18 1038      PT LONG TERM GOAL #1   Title  transfer independent    Status  Achieved      PT LONG TERM GOAL #2   Title  walk with SPC all distances independently    Status  Achieved      PT LONG TERM GOAL #3   Title  goup and down steps independently step over step    Status  Achieved      PT LONG TERM GOAL #4   Title  decrease pain 50%    Status  Achieved      PT LONG TERM GOAL #5   Title  increase AROM to 5-110 degrees flexion    Status  Achieved            Plan - 08/04/18 1035    Clinical Impression Statement  Patient actually limps more and has more difficulty with the right leg (non-operative).  His AROM is good lacking some TKE, strength and endurance are his biggest issues, he did have a lot of questions about normal progression after TKA, I tried to answer these for him.    PT Next Visit Plan  will hold treatment until after he sees the MD, he is to try HEP, he is to call me if he has questions.  Goals met    Consulted and Agree with Plan of Care  Patient       Patient will benefit from skilled therapeutic intervention in order to improve the following deficits and impairments:  Abnormal gait, Dizziness, Pain, Decreased scar mobility, Decreased mobility, Cardiopulmonary status limiting  activity, Decreased activity  tolerance, Decreased endurance, Decreased range of motion, Decreased strength, Impaired flexibility, Increased edema, Difficulty walking, Decreased balance  Visit Diagnosis: Acute pain of left knee  Stiffness of left knee, not elsewhere classified  Difficulty in walking, not elsewhere classified     Problem List Patient Active Problem List   Diagnosis Date Noted  . OA (osteoarthritis) of knee 06/06/2018  . Essential hypertension 09/07/2014  . Hyperlipidemia 09/07/2014  . Groin hematoma 04/06/2014  . Plantar fasciitis, bilateral 03/05/2014  . 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 Abnormal nuclear stress test 01/30/2014  . Coronary artery disease involving native coronary artery of native heart with angina pectoris ([place]) 01/30/2014  . 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 Dyspnea on exertion 01/19/2014  . Gastroesophageal reflux 01/19/2014    Sumner Boast., PT 08/04/2018, 10:40 AM  [place], [place], [address] Phone: [phone]   Fax:  [phone]  Name: SHAURYA RAWDON MRN: [id number] Date of Birth: [birth year]

## 2018-09-05 ENCOUNTER — Other Ambulatory Visit: Payer: Self-pay | Admitting: Interventional Cardiology

## 2018-09-12 ENCOUNTER — Other Ambulatory Visit: Payer: Self-pay | Admitting: Interventional Cardiology

## 2018-09-12 DIAGNOSIS — E785 Hyperlipidemia, unspecified: Secondary | ICD-10-CM

## 2018-10-27 DIAGNOSIS — M25561 Pain in right knee: Secondary | ICD-10-CM | POA: Diagnosis not present

## 2018-10-27 DIAGNOSIS — Z471 Aftercare following joint replacement surgery: Secondary | ICD-10-CM | POA: Diagnosis not present

## 2018-10-27 DIAGNOSIS — Z96652 Presence of left artificial knee joint: Secondary | ICD-10-CM | POA: Diagnosis not present

## 2018-10-31 ENCOUNTER — Other Ambulatory Visit: Payer: Self-pay

## 2018-11-18 DIAGNOSIS — M65352 Trigger finger, left little finger: Secondary | ICD-10-CM | POA: Diagnosis not present

## 2018-11-18 DIAGNOSIS — M25561 Pain in right knee: Secondary | ICD-10-CM | POA: Diagnosis not present

## 2018-11-18 DIAGNOSIS — M6530 Trigger finger, unspecified finger: Secondary | ICD-10-CM | POA: Diagnosis not present

## 2018-11-18 DIAGNOSIS — Z96652 Presence of left artificial knee joint: Secondary | ICD-10-CM | POA: Diagnosis not present

## 2018-12-02 DIAGNOSIS — E785 Hyperlipidemia, unspecified: Secondary | ICD-10-CM | POA: Diagnosis not present

## 2018-12-02 DIAGNOSIS — I10 Essential (primary) hypertension: Secondary | ICD-10-CM | POA: Diagnosis not present

## 2018-12-02 DIAGNOSIS — R739 Hyperglycemia, unspecified: Secondary | ICD-10-CM | POA: Diagnosis not present

## 2018-12-02 DIAGNOSIS — Z0000 Encounter for general adult medical examination without abnormal findings: Secondary | ICD-10-CM | POA: Diagnosis not present

## 2018-12-13 DIAGNOSIS — E785 Hyperlipidemia, unspecified: Secondary | ICD-10-CM | POA: Diagnosis not present

## 2018-12-13 DIAGNOSIS — E119 Type 2 diabetes mellitus without complications: Secondary | ICD-10-CM | POA: Diagnosis not present

## 2018-12-13 DIAGNOSIS — M109 Gout, unspecified: Secondary | ICD-10-CM | POA: Diagnosis not present

## 2018-12-13 DIAGNOSIS — I10 Essential (primary) hypertension: Secondary | ICD-10-CM | POA: Diagnosis not present

## 2019-02-07 DIAGNOSIS — N401 Enlarged prostate with lower urinary tract symptoms: Secondary | ICD-10-CM | POA: Diagnosis not present

## 2019-02-07 DIAGNOSIS — R351 Nocturia: Secondary | ICD-10-CM | POA: Diagnosis not present

## 2019-03-10 DIAGNOSIS — Z0000 Encounter for general adult medical examination without abnormal findings: Secondary | ICD-10-CM | POA: Diagnosis not present

## 2019-03-10 DIAGNOSIS — I10 Essential (primary) hypertension: Secondary | ICD-10-CM | POA: Diagnosis not present

## 2019-03-10 DIAGNOSIS — M109 Gout, unspecified: Secondary | ICD-10-CM | POA: Diagnosis not present

## 2019-03-10 DIAGNOSIS — E119 Type 2 diabetes mellitus without complications: Secondary | ICD-10-CM | POA: Diagnosis not present

## 2019-03-17 DIAGNOSIS — Z0000 Encounter for general adult medical examination without abnormal findings: Secondary | ICD-10-CM | POA: Diagnosis not present

## 2019-03-17 DIAGNOSIS — I10 Essential (primary) hypertension: Secondary | ICD-10-CM | POA: Diagnosis not present

## 2019-03-17 DIAGNOSIS — E119 Type 2 diabetes mellitus without complications: Secondary | ICD-10-CM | POA: Diagnosis not present

## 2019-03-17 DIAGNOSIS — E785 Hyperlipidemia, unspecified: Secondary | ICD-10-CM | POA: Diagnosis not present

## 2019-03-20 ENCOUNTER — Other Ambulatory Visit: Payer: Self-pay | Admitting: *Deleted

## 2019-03-20 DIAGNOSIS — E785 Hyperlipidemia, unspecified: Secondary | ICD-10-CM

## 2019-03-20 MED ORDER — ATORVASTATIN CALCIUM 10 MG PO TABS: 10.0000 mg | ORAL_TABLET | Freq: Every day | ORAL | 1 refills | Status: DC

## 2019-06-09 DIAGNOSIS — M25561 Pain in right knee: Secondary | ICD-10-CM | POA: Diagnosis not present

## 2019-06-09 DIAGNOSIS — M25551 Pain in right hip: Secondary | ICD-10-CM | POA: Diagnosis not present

## 2019-06-09 DIAGNOSIS — M25562 Pain in left knee: Secondary | ICD-10-CM | POA: Diagnosis not present

## 2019-06-09 DIAGNOSIS — Z96652 Presence of left artificial knee joint: Secondary | ICD-10-CM | POA: Diagnosis not present

## 2019-06-15 NOTE — Progress Notes (Signed)
Cardiology Office Note:    Date:  06/16/2019   ID:  Steven Yates, DOB [birth year], MRN [id number]  PCP:  [name], MD  Cardiologist:  Sinclair Grooms, MD   Referring MD: [name], MD   Chief Complaint  Patient presents with  . Coronary Artery Disease    History of Present Illness:    Steven Yates is a 79 y.o. male with a hx of ED, DMT2, CAD s/p CTO PCI to LAD with DESx2 (03/2014), untreated hypertension, HLD, LAFBand multiple medication intolerances.   Notices increased bruising.  Otherwise no complaints.  He has not had angina.  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  He denies angina, orthopnea, PND, palpitations, syncope, and peripheral edema.  He has received the COVID-19 vaccine.  He is not exercising and has gained weight.  Past Medical History:  Diagnosis Date  . Arthritis   . Coronary artery disease    03-2014 2 stents  . Diabetes mellitus without complication ([place])    type 2  . Gastroesophageal reflux 01/19/2014  . Gout   . HTN (hypertension)   . Hypercholesterolemia   . 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 BACK SURGERY     x2  . CARDIAC CATHETERIZATION  01/2014   Dr. Tamala Julian  . CARDIAC CATHETERIZATION  04/04/2014   Procedure: CORONARY/BYPASS GRAFT CTO INTERVENTION;  Surgeon: Peter M Martinique, MD;  Location: [facility] CATH LAB;  Service: Cardiovascular;;  . CARPAL TUNNEL RELEASE     bil   2019  . CHOLECYSTECTOMY  2010  . CORONARY ANGIOPLASTY WITH STENT PLACEMENT  04/04/2014   "2"  . INGUINAL HERNIA REPAIR Bilateral 1966?  . INTRAVASCULAR PRESSURE WIRE/FFR STUDY N/A 09/01/2016   Procedure: Intravascular Pressure Wire/FFR Study;  Surgeon: Belva Crome, MD;  Location: [place] CV LAB;  Service: Cardiovascular;  Laterality: N/A;  . LEFT HEART CATH AND CORONARY  ANGIOGRAPHY N/A 09/01/2016   Procedure: Left Heart Cath and Coronary Angiography;  Surgeon: Belva Crome, MD;  Location: [place] CV LAB;  Service: Cardiovascular;  Laterality: N/A;  . LEFT HEART CATHETERIZATION WITH CORONARY ANGIOGRAM N/A 01/30/2014   Procedure: LEFT HEART CATHETERIZATION WITH CORONARY ANGIOGRAM;  Surgeon: Sinclair Grooms, MD;  Location: [facility] CATH LAB;  Service: Cardiovascular;  Laterality: N/A;  . LUMBAR [place] SURGERY  1980's X 2   "ruptured disc"  . PERCUTANEOUS CORONARY STENT INTERVENTION (PCI-S)  01/30/2014   Procedure: PERCUTANEOUS CORONARY STENT INTERVENTION (PCI-S);  Surgeon: Sinclair Grooms, MD;  Location: [facility] CATH LAB;  Service: Cardiovascular;;  . SHOULDER OPEN ROTATOR CUFF REPAIR Right 1990's  . TOTAL KNEE ARTHROPLASTY Left 06/06/2018   Procedure: TOTAL KNEE ARTHROPLASTY;  Surgeon: Gaynelle Arabian, MD;  Location: [facility] ORS;  Service: Orthopedics;  Laterality: Left;  49min  . ULTRASOUND GUIDANCE FOR VASCULAR ACCESS  09/01/2016   Procedure: Ultrasound Guidance For Vascular Access;  Surgeon: Belva Crome, MD;  Location: [place] CV LAB;  Service: Cardiovascular;;    Current Medications: Current Meds  Medication Sig  . atorvastatin (LIPITOR) 10 MG tablet Take 1 tablet (10 mg total) by mouth daily.  [name] gabapentin (NEURONTIN) 300 MG capsule Take 1 capsule (300 mg total) by mouth 3 (three) times daily. Take a 300 mg capsule three times a day for two  weeks following surgery.Then take a 300 mg capsule two times a day for two weeks. Then take a 300 mg capsule once a day for two weeks. Then discontinue.  [name] glimepiride (AMARYL) 1 MG tablet Take 1 tablet by mouth every morning.  [name] losartan-hydrochlorothiazide (HYZAAR) 50-12.5 MG tablet TAKE 1 TABLET BY MOUTH DAILY  . metFORMIN (GLUCOPHAGE) 500 MG tablet Take 1,000 mg by mouth daily with breakfast.   . methocarbamol (ROBAXIN) 500 MG tablet Take 1 tablet (500 mg total) by mouth every 6 (six) hours as needed for muscle spasms.  .  nitroGLYCERIN (NITROSTAT) 0.4 MG SL tablet Place 1 tablet (0.4 mg total) under the tongue every 5 (five) minutes as needed for chest pain.  [name] oxyCODONE (OXY IR/ROXICODONE) 5 MG immediate release tablet Take 1-2 tablets (5-10 mg total) by mouth every 6 (six) hours as needed for severe pain.  . pantoprazole (PROTONIX) 40 MG tablet Take 1 tablet (40 mg total) by mouth daily.  . traMADol (ULTRAM) 50 MG tablet Take 1-2 tablets (50-100 mg total) by mouth every 6 (six) hours as needed for moderate pain.     Allergies:   Patient has no known allergies.   Social History   Socioeconomic History  . Marital status: Married    Spouse name: Not on file  . Number of children: 2  . Years of education: Not on file  . Highest education level: Not on file  Occupational History  . 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 Smoking status: Former Smoker    Years: 35.00    Types: Cigars    Quit date: 04/29/2018    Years since quitting: 1.1  . Smokeless tobacco: Never Used  Substance and Sexual Activity  . Alcohol use: No    Alcohol/week: 0.0 standard drinks  . Drug use: No  . Sexual activity: Yes  Other Topics Concern  . Not on file  Social History Narrative  . Not on file   Social Determinants of Health   Financial Resource Strain:   . Difficulty of Paying Living Expenses:   Food Insecurity:   . Worried About Charity fundraiser in the Last Year:   . Arboriculturist in the Last Year:   Transportation Needs:   . Film/video editor (Medical):   [name] Lack of Transportation (Non-Medical):   Physical Activity:   . Days of Exercise per Week:   . Minutes of Exercise per Session:   Stress:   . Feeling of Stress :   Social Connections:   . Frequency of Communication with Friends and Family:   . Frequency of Social Gatherings with Friends and Family:   . Attends Religious Services:   . Active Member of Clubs or Organizations:   . Attends Archivist Meetings:   [name] Marital  Status:      Family History: The patient's family history includes Cancer in his brother; Emphysema in his father; Heart disease in his brother and mother; Other in his sister.  ROS:   Please see the history of present illness.    Left knee surgery has led to marked improvement right knee is now giving him trouble.  Otherwise no problems.   All other systems reviewed and are negative.  EKGs/Labs/Other Studies Reviewed:    The following studies were reviewed today: No new cardiac data   EKG:  EKG QS pattern V1 through V3 with poor R wave progression, left axis deviation.  Nonspecific T wave flattening and  no change compared to prior.  Recent Labs: No results found for requested labs within last 8760 hours.  Recent Lipid Panel    Component Value Date/Time   CHOL 90 (L) 07/18/2015 0724   TRIG 149 07/18/2015 0724   HDL 26 (L) 07/18/2015 0724   CHOLHDL 3.5 07/18/2015 0724   VLDL 30 07/18/2015 0724   LDLCALC 34 07/18/2015 0724    Physical Exam:    VS:  BP 140/82   Pulse 61   Ht 5\' 10"  (1.778 m)   Wt 214 lb 12.8 oz (97.4 kg)   SpO2 97%   BMI 30.82 kg/m     Wt Readings from Last 3 Encounters:  06/16/19 214 lb 12.8 oz (97.4 kg)  06/06/18 208 lb 1 oz (94.4 kg)  05/30/18 208 lb 1 oz (94.4 kg)     GEN: Moderate obesity. No acute distress HEENT: Normal NECK: No JVD. LYMPHATICS: No lymphadenopathy CARDIAC:  RRR without murmur, gallop, or edema. VASCULAR:  Normal Pulses. No bruits. RESPIRATORY:  Clear to auscultation without rales, wheezing or rhonchi  ABDOMEN: Soft, non-tender, non-distended, No pulsatile mass, MUSCULOSKELETAL: No deformity  SKIN: Warm and dry NEUROLOGIC:  Alert and oriented x 3 PSYCHIATRIC:  Normal affect   ASSESSMENT:    1. Coronary artery disease involving native coronary artery of native heart with angina pectoris ([place])   2. Left anterior fascicular hemiblock   3. Essential hypertension   4. Other hyperlipidemia   5. Educated about COVID-19  virus infection    PLAN:    In order of problems listed above:  1. Denies angina.  Secondary prevention discussed. 2. No change on EKG 3. Blood pressure little elevated this morning but just take his medications before coming.  Target is still 130/80 mmHg.  Continue Hyzaar 4. LDL cholesterol target less than 70.  Most recent was 46.  Blood work will be done by Dr. Maudie Mercury in May.  Will await results. 5. He has received the vaccine.  He is practicing social distancing.  Overall education and awareness concerning primary/secondary risk prevention was discussed in detail: LDL less than 70, hemoglobin A1c less than 7, blood pressure target less than 130/80 mmHg, >150 minutes of moderate aerobic activity per week, avoidance of smoking, weight control (via diet and exercise), and continued surveillance/management of/for obstructive sleep apnea.    Medication Adjustments/Labs and Tests Ordered: Current medicines are reviewed at length with the patient today.  Concerns regarding medicines are outlined above.  Orders Placed This Encounter  Procedures  . EKG 12-Lead   No orders of the defined types were placed in this encounter.   Patient Instructions  Medication Instructions:  Your physician recommends that you continue on your current medications as directed. Please refer to the Current Medication list given to you today.  *If you need a refill on your cardiac medications before your next appointment, please call your pharmacy*   Lab Work: None If you have labs (blood work) drawn today and your tests are completely normal, you will receive your results only by: [name] MyChart Message (if you have MyChart) OR . A paper copy in the mail If you have any lab test that is abnormal or we need to change your treatment, we will call you to review the results.   Testing/Procedures: None   Follow-Up: At [facility], you and your health needs are our priority.  As part of our continuing mission  to provide you with exceptional heart care, we have created designated Provider Care  Teams.  These Care Teams include your primary Cardiologist (physician) and Advanced Practice Providers (APPs -  Physician Assistants and Nurse Practitioners) who all work together to provide you with the care you need, when you need it.  We recommend signing up for the patient portal called "MyChart".  Sign up information is provided on this After Visit Summary.  MyChart is used to connect with patients for Virtual Visits (Telemedicine).  Patients are able to view lab/test results, encounter notes, upcoming appointments, etc.  Non-urgent messages can be sent to your provider as well.   To learn more about what you can do with MyChart, go to [email or web address].    Your next appointment:   12 month(s)  The format for your next appointment:   In Person  Provider:   You may see Sinclair Grooms, MD or one of the following Advanced Practice Providers on your designated Care Team:    Truitt Merle, NP  [name], NP  Kathyrn Drown, NP    Other Instructions      Signed, Sinclair Grooms, MD  06/16/2019 9:48 AM    [place]

## 2019-06-16 ENCOUNTER — Ambulatory Visit: Payer: PPO | Admitting: Interventional Cardiology

## 2019-06-16 ENCOUNTER — Encounter: Payer: Self-pay | Admitting: Interventional Cardiology

## 2019-06-16 ENCOUNTER — Other Ambulatory Visit: Payer: Self-pay

## 2019-06-16 VITALS — BP 140/82 | HR 61 | Ht 70.0 in | Wt 214.8 lb

## 2019-06-16 DIAGNOSIS — I10 Essential (primary) hypertension: Secondary | ICD-10-CM

## 2019-06-16 DIAGNOSIS — I444 Left anterior fascicular block: Secondary | ICD-10-CM | POA: Diagnosis not present

## 2019-06-16 DIAGNOSIS — I25119 Atherosclerotic heart disease of native coronary artery with unspecified angina pectoris: Secondary | ICD-10-CM

## 2019-06-16 DIAGNOSIS — Z7189 Other specified counseling: Secondary | ICD-10-CM | POA: Diagnosis not present

## 2019-06-16 DIAGNOSIS — E7849 Other hyperlipidemia: Secondary | ICD-10-CM | POA: Diagnosis not present

## 2019-06-16 NOTE — Patient Instructions (Signed)

## 2019-06-20 DIAGNOSIS — M25561 Pain in right knee: Secondary | ICD-10-CM | POA: Diagnosis not present

## 2019-06-22 DIAGNOSIS — M25512 Pain in left shoulder: Secondary | ICD-10-CM | POA: Diagnosis not present

## 2019-06-30 DIAGNOSIS — M25561 Pain in right knee: Secondary | ICD-10-CM | POA: Diagnosis not present

## 2019-06-30 DIAGNOSIS — M1711 Unilateral primary osteoarthritis, right knee: Secondary | ICD-10-CM | POA: Diagnosis not present

## 2019-07-28 ENCOUNTER — Other Ambulatory Visit: Payer: Self-pay

## 2019-07-28 DIAGNOSIS — E785 Hyperlipidemia, unspecified: Secondary | ICD-10-CM

## 2019-07-28 MED ORDER — LOSARTAN POTASSIUM-HCTZ 50-12.5 MG PO TABS: 1.0000 | ORAL_TABLET | Freq: Every day | ORAL | 3 refills | Status: DC

## 2019-07-28 MED ORDER — ATORVASTATIN CALCIUM 10 MG PO TABS: 10.0000 mg | ORAL_TABLET | Freq: Every day | ORAL | 3 refills | Status: DC

## 2019-08-18 DIAGNOSIS — M25512 Pain in left shoulder: Secondary | ICD-10-CM | POA: Diagnosis not present

## 2019-10-24 ENCOUNTER — Other Ambulatory Visit: Payer: Self-pay

## 2019-11-09 DIAGNOSIS — I10 Essential (primary) hypertension: Secondary | ICD-10-CM | POA: Diagnosis not present

## 2019-11-09 DIAGNOSIS — E119 Type 2 diabetes mellitus without complications: Secondary | ICD-10-CM | POA: Diagnosis not present

## 2019-11-16 DIAGNOSIS — M79642 Pain in left hand: Secondary | ICD-10-CM | POA: Diagnosis not present

## 2019-11-16 DIAGNOSIS — M65331 Trigger finger, right middle finger: Secondary | ICD-10-CM | POA: Diagnosis not present

## 2019-11-16 DIAGNOSIS — M65352 Trigger finger, left little finger: Secondary | ICD-10-CM | POA: Diagnosis not present

## 2020-02-09 ENCOUNTER — Telehealth: Payer: Self-pay | Admitting: Interventional Cardiology

## 2020-02-09 MED ORDER — NITROGLYCERIN 0.4 MG SL SUBL: 0.4000 mg | SUBLINGUAL_TABLET | SUBLINGUAL | 3 refills | Status: DC | PRN

## 2020-02-09 NOTE — Telephone Encounter (Signed)
Pt c/o Shortness Of Breath: STAT if SOB developed within the last 24 hours or pt is noticeably SOB on the phone  1. Are you currently SOB (can you hear that pt is SOB on the phone)? Pt says yes a little bit. Pt is sitting in his chair now   2. How long have you been experiencing SOB? Noticeable for about a week  3. Are you SOB when sitting or when up moving around? All the time   4. Are you currently experiencing any other symptoms? Chest pain yesterday    Pt c/o of Chest Pain: STAT if CP now or developed within 24 hours  1. Are you having CP right now? no  2. Are you experiencing any other symptoms (ex. SOB, nausea, vomiting, sweating)? SOB- see above  3. How long have you been experiencing CP? About a day  4. Is your CP continuous or coming and going? continuous  5. Have you taken Nitroglycerin? no ? Pt said he was cutting some bushes yesterday and had some CP. He just wanted to get Dr. Tamala Julian to check him out

## 2020-02-09 NOTE — Telephone Encounter (Signed)
Spoke with pt who reports he had CP yesterday most of the day.  Pt states he did not try any nitro but did take some Pepto Bismol and Phazyme without relief.  Tried Vinegar and water later that night and finally received some relief.  Pt also complaining of SOB x 1 week mostly on exertion. Pt denies active CP today and is resting in his chair.  History of CAD and stent per pt.  Appointment scheduled with Kathyrn Drown 02/14/2020 at 215pm.  Pt advised to rest mostly and avoid strenuous activity until he can be evaluated.  Continue medications as prescribed.  Pt's nitro has expired, Nitro refilled and sent to pharmacy on file.  Reviewed ED precautions.  Pt verbalizes understanding and agrees with current plan.

## 2020-02-11 NOTE — Telephone Encounter (Signed)
If still having rest pain, should go to ER.

## 2020-02-12 NOTE — Telephone Encounter (Signed)
Spoke with pt and he states he has not had anymore CP since the call last week.  Advised if pain returns to report to ED, otherwise we will see him on Wednesday for eval.  Pt verbalized understanding and was in agreement with this plan.

## 2020-02-12 NOTE — Progress Notes (Deleted)
Cardiology Office Note   Date:  02/12/2020   ID:  JAMMAL SARR, DOB 24-Mar-1941, MRN [id number]  PCP:  [name], MD  Cardiologist:  Dr. Tamala Julian  No chief complaint on file.     History of Present Illness: Steven Yates is a 79 y.o. male who presents for the evaluation of chest pain, seen for Dr. Tamala Julian.   Mr. Morgenstern has a history of CAD status post CTO PCI to the LAD with DES times 04/30/2014, HTN, HLD, LAFB, DM2, ED with multiple medication intolerances.  He was last seen by Dr. Tamala Julian 06/16/2019 and was doing well from a CV standpoint.  Unfortunately, patient called the office 02/09/2020 with reports of chest pain.  He stated he took Pepto-Bismol without relief however did not try SL NTG.  He is also has shortness of breath x1 week.  There was no resting chest pain.  Patient's last cardiac catheterization 09/01/2016 with mild to moderate in-stent restenosis focally in the proximal to distal areas of overlapping LAD stents.  FFR across the entire stented segment was 0.82.  There was a widely patent LCx and right coronary artery.  Given recurrent chest pain, there is some concern for progression of disease   1.  Chest pain with history of CAD: -Last LHC 09/01/2016 with mild to moderate in-stent restenosis focally in the proximal to distal one third of overlapping LAD stents.  FFR across the entire stented segment was 0.82.  There was widely patent LCx and RCA arteries.  Plan was for continued medical management.  Given new chest pain and the above results, there is concern for progression of disease.  2.  HTN:   3.  HLD:   4.  Left anterior fascicular hemiblock:     Past Medical History:  Diagnosis Date  . Arthritis   . Coronary artery disease    03-2014 2 stents  . Diabetes mellitus without complication ([place])    type 2  . Gastroesophageal reflux 01/19/2014  . Gout   . HTN (hypertension)   . Hypercholesterolemia   . 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 BACK SURGERY     x2  . CARDIAC CATHETERIZATION  01/2014   Dr. Tamala Julian  . CARDIAC CATHETERIZATION  04/04/2014   Procedure: CORONARY/BYPASS GRAFT CTO INTERVENTION;  Surgeon: Peter M Martinique, MD;  Location: [facility] CATH LAB;  Service: Cardiovascular;;  . CARPAL TUNNEL RELEASE     bil   2019  . CHOLECYSTECTOMY  2010  . CORONARY ANGIOPLASTY WITH STENT PLACEMENT  04/04/2014   "2"  . INGUINAL HERNIA REPAIR Bilateral 1966?  . INTRAVASCULAR PRESSURE WIRE/FFR STUDY N/A 09/01/2016   Procedure: Intravascular Pressure Wire/FFR Study;  Surgeon: Belva Crome, MD;  Location: [place] CV LAB;  Service: Cardiovascular;  Laterality: N/A;  . LEFT HEART CATH AND CORONARY ANGIOGRAPHY N/A 09/01/2016   Procedure: Left Heart Cath and Coronary Angiography;  Surgeon: Belva Crome, MD;  Location: [place] CV LAB;  Service: Cardiovascular;  Laterality: N/A;  . LEFT HEART CATHETERIZATION WITH CORONARY ANGIOGRAM N/A 01/30/2014   Procedure: LEFT HEART CATHETERIZATION WITH CORONARY ANGIOGRAM;  Surgeon: Sinclair Grooms, MD;  Location: [facility] CATH LAB;  Service: Cardiovascular;  Laterality: N/A;  . LUMBAR [place] SURGERY  1980's X 2   "ruptured disc"  . PERCUTANEOUS CORONARY STENT INTERVENTION (PCI-S)  01/30/2014   Procedure: PERCUTANEOUS CORONARY STENT INTERVENTION (  PCI-S);  Surgeon: Sinclair Grooms, MD;  Location: [facility] CATH LAB;  Service: Cardiovascular;;  . SHOULDER OPEN ROTATOR CUFF REPAIR Right 1990's  . TOTAL KNEE ARTHROPLASTY Left 06/06/2018   Procedure: TOTAL KNEE ARTHROPLASTY;  Surgeon: Gaynelle Arabian, MD;  Location: [facility] ORS;  Service: Orthopedics;  Laterality: Left;  29min  . ULTRASOUND GUIDANCE FOR VASCULAR ACCESS  09/01/2016   Procedure: Ultrasound Guidance For Vascular Access;  Surgeon: Belva Crome, MD;  Location: [place] CV LAB;  Service: Cardiovascular;;     Current Outpatient Medications  Medication Sig Dispense  Refill  . atorvastatin (LIPITOR) 10 MG tablet Take 1 tablet (10 mg total) by mouth daily. 90 tablet 3  . gabapentin (NEURONTIN) 300 MG capsule Take 1 capsule (300 mg total) by mouth 3 (three) times daily. Take a 300 mg capsule three times a day for two weeks following surgery.Then take a 300 mg capsule two times a day for two weeks. Then take a 300 mg capsule once a day for two weeks. Then discontinue. 84 capsule 0  . glimepiride (AMARYL) 1 MG tablet Take 1 tablet by mouth every morning.  11  . losartan-hydrochlorothiazide (HYZAAR) 50-12.5 MG tablet Take 1 tablet by mouth daily. 90 tablet 3  . metFORMIN (GLUCOPHAGE) 500 MG tablet Take 1,000 mg by mouth daily with breakfast.     . methocarbamol (ROBAXIN) 500 MG tablet Take 1 tablet (500 mg total) by mouth every 6 (six) hours as needed for muscle spasms. 40 tablet 0  . nitroGLYCERIN (NITROSTAT) 0.4 MG SL tablet Place 1 tablet (0.4 mg total) under the tongue every 5 (five) minutes as needed for chest pain. 25 tablet 3  . oxyCODONE (OXY IR/ROXICODONE) 5 MG immediate release tablet Take 1-2 tablets (5-10 mg total) by mouth every 6 (six) hours as needed for severe pain. 56 tablet 0  . pantoprazole (PROTONIX) 40 MG tablet Take 1 tablet (40 mg total) by mouth daily. 30 tablet 11  . traMADol (ULTRAM) 50 MG tablet Take 1-2 tablets (50-100 mg total) by mouth every 6 (six) hours as needed for moderate pain. 40 tablet 0   No current facility-administered medications for this visit.    Allergies:   Patient has no known allergies.    Social History:  The patient  reports that he quit smoking about 21 months ago. His smoking use included cigars. He quit after 35.00 years of use. He has never used smokeless tobacco. He reports that he does not drink alcohol and does not use drugs.   Family History:  The patient's ***family history includes Cancer in his brother; Emphysema in his father; Heart disease in his brother and mother; Other in his sister.    ROS:   Please see the history of present illness.   Otherwise, review of systems are positive for {NONE DEFAULTED:18576::"none"}.   All other systems are reviewed and negative.    PHYSICAL EXAM: VS:  There were no vitals taken for this visit. , BMI There is no height or weight on file to calculate BMI. GEN: Well nourished, well developed, in no acute distress HEENT: normal Neck: no JVD, carotid bruits, or masses Cardiac: ***RRR; no murmurs, rubs, or gallops,no edema  Respiratory:  clear to auscultation bilaterally, normal work of breathing GI: soft, nontender, nondistended, + BS MS: no deformity or atrophy Skin: warm and dry, no rash Neuro:  Strength and sensation are intact Psych: euthymic mood, full affect   EKG:  EKG {ACTION; IS/IS OXB:35329924} ordered today. The ekg  ordered today demonstrates ***   Recent Labs: No results found for requested labs within last 8760 hours.    Lipid Panel    Component Value Date/Time   CHOL 90 (L) 07/18/2015 0724   TRIG 149 07/18/2015 0724   HDL 26 (L) 07/18/2015 0724   CHOLHDL 3.5 07/18/2015 0724   VLDL 30 07/18/2015 0724   LDLCALC 34 07/18/2015 0724      Wt Readings from Last 3 Encounters:  06/16/19 214 lb 12.8 oz (97.4 kg)  06/06/18 208 lb 1 oz (94.4 kg)  05/30/18 208 lb 1 oz (94.4 kg)      Other studies Reviewed: Additional studies/ records that were reviewed today include: ***. Review of the above records demonstrates: ***   LHC 09/01/2016:    Mild to moderate in-stent restenosis focally in the proximal one third and also in the distal one third of the overlapping LAD stents. FFR across the entire stented segment is 0.82.  Widely patent circumflex and right coronary artery.  Normal left ventricular systolic function.  RECOMMENDATIONS:  Continue medical therapy     ASSESSMENT AND PLAN:  1.  ***   Current medicines are reviewed at length with the patient today.  The patient {ACTIONS; HAS/DOES NOT HAVE:19233} concerns  regarding medicines.  The following changes have been made:  {PLAN; NO CHANGE:13088:s}  Labs/ tests ordered today include: *** No orders of the defined types were placed in this encounter.    Disposition:   FU with *** in {gen number 7-54:360677} {Days to years:10300}  Signed, Kathyrn Drown, NP  02/12/2020 [facility] [place], [place], [place]  [address] Phone: [phone]; Fax: [phone]

## 2020-02-14 ENCOUNTER — Ambulatory Visit: Payer: PPO | Admitting: Cardiology

## 2020-03-01 DIAGNOSIS — H353132 Nonexudative age-related macular degeneration, bilateral, intermediate dry stage: Secondary | ICD-10-CM | POA: Diagnosis not present

## 2020-05-24 ENCOUNTER — Ambulatory Visit: Payer: PPO | Admitting: Family Medicine

## 2020-06-25 DIAGNOSIS — C44319 Basal cell carcinoma of skin of other parts of face: Secondary | ICD-10-CM | POA: Diagnosis not present

## 2020-06-25 DIAGNOSIS — L728 Other follicular cysts of the skin and subcutaneous tissue: Secondary | ICD-10-CM | POA: Diagnosis not present

## 2020-06-25 DIAGNOSIS — L821 Other seborrheic keratosis: Secondary | ICD-10-CM | POA: Diagnosis not present

## 2020-06-25 DIAGNOSIS — L820 Inflamed seborrheic keratosis: Secondary | ICD-10-CM | POA: Diagnosis not present

## 2020-06-25 DIAGNOSIS — L570 Actinic keratosis: Secondary | ICD-10-CM | POA: Diagnosis not present

## 2020-06-25 DIAGNOSIS — Z85828 Personal history of other malignant neoplasm of skin: Secondary | ICD-10-CM | POA: Diagnosis not present

## 2020-06-25 DIAGNOSIS — H61012 Acute perichondritis of left external ear: Secondary | ICD-10-CM | POA: Diagnosis not present

## 2020-06-25 DIAGNOSIS — C44629 Squamous cell carcinoma of skin of left upper limb, including shoulder: Secondary | ICD-10-CM | POA: Diagnosis not present

## 2020-06-25 DIAGNOSIS — L905 Scar conditions and fibrosis of skin: Secondary | ICD-10-CM | POA: Diagnosis not present

## 2020-06-25 DIAGNOSIS — D485 Neoplasm of uncertain behavior of skin: Secondary | ICD-10-CM | POA: Diagnosis not present

## 2020-06-28 DIAGNOSIS — I10 Essential (primary) hypertension: Secondary | ICD-10-CM | POA: Diagnosis not present

## 2020-06-28 DIAGNOSIS — E785 Hyperlipidemia, unspecified: Secondary | ICD-10-CM | POA: Diagnosis not present

## 2020-06-28 DIAGNOSIS — Z125 Encounter for screening for malignant neoplasm of prostate: Secondary | ICD-10-CM | POA: Diagnosis not present

## 2020-06-28 DIAGNOSIS — R5383 Other fatigue: Secondary | ICD-10-CM | POA: Diagnosis not present

## 2020-06-28 DIAGNOSIS — E119 Type 2 diabetes mellitus without complications: Secondary | ICD-10-CM | POA: Diagnosis not present

## 2020-07-05 DIAGNOSIS — E1159 Type 2 diabetes mellitus with other circulatory complications: Secondary | ICD-10-CM | POA: Diagnosis not present

## 2020-07-05 DIAGNOSIS — Z0000 Encounter for general adult medical examination without abnormal findings: Secondary | ICD-10-CM | POA: Diagnosis not present

## 2020-07-05 DIAGNOSIS — Z79899 Other long term (current) drug therapy: Secondary | ICD-10-CM | POA: Diagnosis not present

## 2020-07-05 DIAGNOSIS — K219 Gastro-esophageal reflux disease without esophagitis: Secondary | ICD-10-CM | POA: Diagnosis not present

## 2020-07-05 DIAGNOSIS — Z955 Presence of coronary angioplasty implant and graft: Secondary | ICD-10-CM | POA: Diagnosis not present

## 2020-07-05 DIAGNOSIS — M109 Gout, unspecified: Secondary | ICD-10-CM | POA: Diagnosis not present

## 2020-07-05 DIAGNOSIS — N529 Male erectile dysfunction, unspecified: Secondary | ICD-10-CM | POA: Diagnosis not present

## 2020-07-05 DIAGNOSIS — M159 Polyosteoarthritis, unspecified: Secondary | ICD-10-CM | POA: Diagnosis not present

## 2020-07-10 DIAGNOSIS — C44319 Basal cell carcinoma of skin of other parts of face: Secondary | ICD-10-CM | POA: Diagnosis not present

## 2020-07-10 DIAGNOSIS — D0462 Carcinoma in situ of skin of left upper limb, including shoulder: Secondary | ICD-10-CM | POA: Diagnosis not present

## 2020-07-24 DIAGNOSIS — D0462 Carcinoma in situ of skin of left upper limb, including shoulder: Secondary | ICD-10-CM | POA: Diagnosis not present

## 2020-07-30 ENCOUNTER — Other Ambulatory Visit: Payer: Self-pay

## 2020-07-30 MED ORDER — LOSARTAN POTASSIUM-HCTZ 50-12.5 MG PO TABS: 1.0000 | ORAL_TABLET | Freq: Every day | ORAL | 3 refills | Status: DC

## 2020-08-23 ENCOUNTER — Ambulatory Visit: Payer: PPO | Admitting: Interventional Cardiology

## 2020-08-23 DIAGNOSIS — M25561 Pain in right knee: Secondary | ICD-10-CM | POA: Diagnosis not present

## 2020-08-28 DIAGNOSIS — R220 Localized swelling, mass and lump, head: Secondary | ICD-10-CM | POA: Diagnosis not present

## 2020-08-29 ENCOUNTER — Other Ambulatory Visit: Payer: Self-pay

## 2020-08-29 DIAGNOSIS — E785 Hyperlipidemia, unspecified: Secondary | ICD-10-CM

## 2020-08-29 MED ORDER — ATORVASTATIN CALCIUM 10 MG PO TABS: 10.0000 mg | ORAL_TABLET | Freq: Every day | ORAL | 0 refills | Status: DC

## 2020-09-04 DIAGNOSIS — H029 Unspecified disorder of eyelid: Secondary | ICD-10-CM | POA: Diagnosis not present

## 2020-09-04 DIAGNOSIS — H353132 Nonexudative age-related macular degeneration, bilateral, intermediate dry stage: Secondary | ICD-10-CM | POA: Diagnosis not present

## 2020-09-04 DIAGNOSIS — L820 Inflamed seborrheic keratosis: Secondary | ICD-10-CM | POA: Diagnosis not present

## 2020-09-12 DIAGNOSIS — R220 Localized swelling, mass and lump, head: Secondary | ICD-10-CM | POA: Diagnosis not present

## 2020-09-12 DIAGNOSIS — E041 Nontoxic single thyroid nodule: Secondary | ICD-10-CM | POA: Diagnosis not present

## 2020-09-18 ENCOUNTER — Other Ambulatory Visit: Payer: Self-pay | Admitting: Family Medicine

## 2020-09-18 DIAGNOSIS — R9389 Abnormal findings on diagnostic imaging of other specified body structures: Secondary | ICD-10-CM

## 2020-09-18 DIAGNOSIS — R220 Localized swelling, mass and lump, head: Secondary | ICD-10-CM

## 2020-10-10 ENCOUNTER — Ambulatory Visit
Admission: RE | Admit: 2020-10-10 | Discharge: 2020-10-10 | Disposition: A | Discharge status: Home / Self Care | Payer: PPO | Source: Ambulatory Visit | Attending: Family Medicine | Admitting: Family Medicine

## 2020-10-10 DIAGNOSIS — L720 Epidermal cyst: Secondary | ICD-10-CM | POA: Diagnosis not present

## 2020-10-10 DIAGNOSIS — R9389 Abnormal findings on diagnostic imaging of other specified body structures: Secondary | ICD-10-CM

## 2020-10-10 DIAGNOSIS — R220 Localized swelling, mass and lump, head: Secondary | ICD-10-CM | POA: Diagnosis not present

## 2020-10-10 MED ORDER — IOPAMIDOL (ISOVUE-300) INJECTION 61%
75.0000 mL | Freq: Once | INTRAVENOUS | Status: AC | PRN
  Administered 2020-10-10: 75 mL via INTRAVENOUS

## 2020-10-15 ENCOUNTER — Other Ambulatory Visit: Payer: PPO

## 2020-10-17 DIAGNOSIS — Z85828 Personal history of other malignant neoplasm of skin: Secondary | ICD-10-CM | POA: Diagnosis not present

## 2020-10-17 DIAGNOSIS — K118 Other diseases of salivary glands: Secondary | ICD-10-CM | POA: Diagnosis not present

## 2020-11-14 ENCOUNTER — Encounter (HOSPITAL_COMMUNITY): Payer: Self-pay | Admitting: Radiology

## 2020-11-14 ENCOUNTER — Other Ambulatory Visit (HOSPITAL_COMMUNITY): Payer: Self-pay | Admitting: Otolaryngology

## 2020-11-14 DIAGNOSIS — K118 Other diseases of salivary glands: Secondary | ICD-10-CM

## 2020-11-14 DIAGNOSIS — Z85828 Personal history of other malignant neoplasm of skin: Secondary | ICD-10-CM

## 2020-11-14 NOTE — Progress Notes (Signed)
Patient Name  Gunner, Montalbano Legal Sex  Male DOB  Dec 18, 1940 SSN  [id number] Address  [place] [address] Phone  [phone] [facility])  [phone] (Mobile)    RE: [place] FNA Parotid Received: Today Corrie Mckusick, DO  Garth Bigness D OK for US guided right parotid mass FNA.   Earleen Newport        Previous Messages   ----- Message -----  From: Garth Bigness D  Sent: 11/14/2020   3:10 PM EDT  To: Ir Procedure Requests  Subject: [place] FNA Parotid                                 Procedure: [place] FNA Parotid   Reason:  Parotid mass, History of skin cancer   History:  CT in computer   Provider:  Izora Gala   Provider Contact:  [phone]

## 2020-11-21 ENCOUNTER — Other Ambulatory Visit: Payer: Self-pay | Admitting: Radiology

## 2020-11-22 ENCOUNTER — Other Ambulatory Visit: Payer: Self-pay | Admitting: Internal Medicine

## 2020-11-25 ENCOUNTER — Ambulatory Visit (HOSPITAL_COMMUNITY)
Admission: RE | Admit: 2020-11-25 | Discharge: 2020-11-25 | Disposition: A | Discharge status: Home / Self Care | Payer: PPO | Source: Ambulatory Visit | Attending: Otolaryngology | Admitting: Otolaryngology

## 2020-11-25 ENCOUNTER — Other Ambulatory Visit: Payer: Self-pay

## 2020-11-25 ENCOUNTER — Encounter (HOSPITAL_COMMUNITY): Payer: Self-pay

## 2020-11-25 DIAGNOSIS — K1120 Sialoadenitis, unspecified: Secondary | ICD-10-CM | POA: Diagnosis not present

## 2020-11-25 DIAGNOSIS — K118 Other diseases of salivary glands: Secondary | ICD-10-CM | POA: Diagnosis not present

## 2020-11-25 DIAGNOSIS — Z85828 Personal history of other malignant neoplasm of skin: Secondary | ICD-10-CM

## 2020-11-25 DIAGNOSIS — Z87891 Personal history of nicotine dependence: Secondary | ICD-10-CM | POA: Diagnosis not present

## 2020-11-25 MED ORDER — GELATIN ABSORBABLE 12-7 MM EX MISC
CUTANEOUS | Status: AC
  Filled 2020-11-25: qty 1

## 2020-11-25 MED ORDER — FENTANYL CITRATE (PF) 100 MCG/2ML IJ SOLN
INTRAMUSCULAR | Status: AC
  Filled 2020-11-25: qty 4

## 2020-11-25 MED ORDER — MIDAZOLAM HCL 2 MG/2ML IJ SOLN
INTRAMUSCULAR | Status: AC
  Filled 2020-11-25: qty 4

## 2020-11-25 MED ORDER — SODIUM CHLORIDE 0.9 % IV SOLN: INTRAVENOUS | Status: DC

## 2020-11-25 MED ORDER — MIDAZOLAM HCL 2 MG/2ML IJ SOLN
INTRAMUSCULAR | Status: DC | PRN
  Administered 2020-11-25 (×2): 1 mg via INTRAVENOUS

## 2020-11-25 MED ORDER — LIDOCAINE HCL (PF) 1 % IJ SOLN
INTRAMUSCULAR | Status: AC
  Filled 2020-11-25: qty 30

## 2020-11-25 MED ORDER — FENTANYL CITRATE (PF) 100 MCG/2ML IJ SOLN
INTRAMUSCULAR | Status: DC | PRN
  Administered 2020-11-25: 50 ug via INTRAVENOUS
  Administered 2020-11-25: 25 ug via INTRAVENOUS

## 2020-11-25 NOTE — Procedures (Signed)
Interventional Radiology Procedure:   Indications: Right parotid nodule  Procedure: US guided FNA of right parotid nodule  Findings: 5 FNAs obtained from right parotid nodule  Complications: No immediate complications noted.     EBL: Minimal  Plan: Discharge to home in hour   Barnes Florek R. Anselm Pancoast, MD  Pager: [phone]

## 2020-11-25 NOTE — H&P (Signed)
Chief Complaint: Patient was seen in consultation today for right parotid mass biopsy at the request of [name],[name]  Referring Physician(s): [name],[name]  Supervising Physician: Markus Daft  Patient Status: [facility] - Out-pt  History of Present Illness: Steven Yates is a 80 y.o. male   Has had skin cancers removed 3-4 months ago from face and arm 2 months ago noted small lump in Rt neck. Was treated with antibiotics and lump did resolve Now it has recurred in same place. Former smoker--- quit x 2 yrs  Was seen by Dr Elie Goody CT 10/10/20: IMPRESSION: 1. A 10 mm centrally hypodense lymph node at the deep aspect of the right parotid gland, in close proximity to the marked site. This is concerning for metastatic disease. This may be from an unknown head/neck primary, or from the reported previous skin cancer. 2. Posterior midline subcutaneous inclusion cyst.  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 Tamala Julian   CARDIAC CATHETERIZATION  04/04/2014   Procedure: CORONARY/BYPASS GRAFT CTO INTERVENTION;  Surgeon: Peter M Martinique, MD;  Location: [facility] CATH LAB;  Service: Cardiovascular;;   CARPAL TUNNEL RELEASE     bil   2019   CHOLECYSTECTOMY  2010   CORONARY ANGIOPLASTY WITH STENT PLACEMENT  04/04/2014   "2"   INGUINAL HERNIA REPAIR Bilateral 1966?   INTRAVASCULAR PRESSURE WIRE/FFR STUDY N/A 09/01/2016   Procedure: Intravascular Pressure Wire/FFR Study;  Surgeon: Belva Crome, MD;  Location: [place] CV LAB;  Service:  Cardiovascular;  Laterality: N/A;   LEFT HEART CATH AND CORONARY ANGIOGRAPHY N/A 09/01/2016   Procedure: Left Heart Cath and Coronary Angiography;  Surgeon: Belva Crome, MD;  Location: [place] CV LAB;  Service: Cardiovascular;  Laterality: N/A;   LEFT HEART CATHETERIZATION WITH CORONARY ANGIOGRAM N/A 01/30/2014   Procedure: LEFT HEART CATHETERIZATION WITH CORONARY ANGIOGRAM;  Surgeon: Sinclair Grooms, MD;  Location: [facility] CATH LAB;  Service: Cardiovascular;  Laterality: N/A;   LUMBAR DISC SURGERY  1980's X 2   "ruptured disc"   PERCUTANEOUS CORONARY STENT INTERVENTION (PCI-S)  01/30/2014   Procedure: PERCUTANEOUS CORONARY STENT INTERVENTION (PCI-S);  Surgeon: Sinclair Grooms, MD;  Location: [facility] CATH LAB;  Service: Cardiovascular;;   SHOULDER OPEN ROTATOR CUFF REPAIR Right 1990's   TOTAL KNEE ARTHROPLASTY Left 06/06/2018   Procedure: TOTAL KNEE ARTHROPLASTY;  Surgeon: Gaynelle Arabian, MD;  Location: [facility] ORS;  Service: Orthopedics;  Laterality: Left;  23mn   ULTRASOUND GUIDANCE FOR VASCULAR ACCESS  09/01/2016   Procedure: Ultrasound Guidance For Vascular Access;  Surgeon: SBelva Crome MD;  [facility];  Service: Cardiovascular;;    Allergies: Patient has no known allergies.  Medications: Prior to Admission medications   Medication Sig Start Date End Date Taking? Authorizing Provider  acetaminophen (TYLENOL) 500 MG tablet Take 500 mg by mouth every 6 (six) hours as needed for moderate pain or headache.   Yes [provider]  aspirin EC 81 MG tablet Take 81 mg by mouth daily. Swallow whole.   Yes [provider]  atorvastatin (LIPITOR) 10 MG tablet Take 1 tablet (10 mg total) by mouth daily. Please call to schedule appointment for future refills. 08/29/20  Yes Belva Crome, MD  glimepiride (AMARYL) 1 MG tablet Take 1 tablet by mouth every morning. 11/06/17  Yes [provider]  losartan-hydrochlorothiazide (HYZAAR) 50-12.5 MG tablet Take 1 tablet by  mouth daily. 07/30/20  Yes Belva Crome, MD  metFORMIN (GLUCOPHAGE) 500 MG tablet Take 1,000 mg by mouth daily with breakfast.  05/21/15  Yes [provider]  nitroGLYCERIN (NITROSTAT) 0.4 MG SL tablet Place 1 tablet (0.4 mg total) under the tongue every 5 (five) minutes as needed for chest pain. 02/09/20  Yes Belva Crome, MD  pantoprazole (PROTONIX) 40 MG tablet Take 1 tablet (40 mg total) by mouth daily. 04/02/14  Yes Martinique, Peter M, MD  gabapentin (NEURONTIN) 300 MG capsule Take 1 capsule (300 mg total) by mouth 3 (three) times daily. Take a 300 mg capsule three times a day for two weeks following surgery.Then take a 300 mg capsule two times a day for two weeks. Then take a 300 mg capsule once a day for two weeks. Then discontinue. Patient not taking: No sig reported 06/07/18   Edmisten, Ok Anis, PA     Family History  Problem Relation Age of Onset   Emphysema Father    Heart disease Mother    Heart disease Brother    Cancer Brother    Other Sister        GI DISEASE    Social History   Socioeconomic History   Marital status: Married    Spouse name: Not on file   Number of children: 2   Years of education: Not on file   Highest education level: Not on file  Occupational History   Occupation: [employer]  Tobacco Use   Smoking status: Former    Types: Cigars    Quit date: 04/29/2018    Years since quitting: 2.5   Smokeless tobacco: Never  Vaping Use   Vaping Use: Never used  Substance and Sexual Activity   Alcohol use: No    Alcohol/week: 0.0 standard drinks   Drug use: No   Sexual activity: Yes  Other Topics Concern   Not on file  Social History Narrative   Not on file   Social Determinants of Health   Financial Resource Strain: Not on file  Food Insecurity: Not on file  Transportation Needs: Not on file  Physical Activity: Not on file  Stress: Not on file  Social Connections: Not on file     Review of Systems: A 12 point ROS discussed  and pertinent positives are indicated in the HPI above.  All other systems are negative.  Review of Systems  Constitutional:  Negative for activity change, appetite change, fatigue and fever.  HENT:  Negative for sore throat and trouble swallowing.   Respiratory:  Negative for cough and shortness of breath.   Cardiovascular:  Negative for chest pain.  Gastrointestinal:  Negative for abdominal pain.  Psychiatric/Behavioral:  Negative for behavioral problems and confusion.    Vital Signs: There were no vitals taken for this visit.  Physical Exam Vitals reviewed.  HENT:     Mouth/Throat:     Mouth: Mucous membranes are moist.  Cardiovascular:     Rate and Rhythm: Normal rate and regular rhythm.  Heart sounds: Normal heart sounds.  Pulmonary:     Effort: Pulmonary effort is normal.     Breath sounds: Normal breath sounds.  Abdominal:     Palpations: Abdomen is soft.     Tenderness: There is no abdominal tenderness.  Musculoskeletal:        General: Normal range of motion.  Skin:    General: Skin is warm.  Neurological:     Mental Status: He is alert and oriented to person, place, and time.  Psychiatric:        Behavior: Behavior normal.    Imaging: No results found.  Labs:  CBC: No results for input(s): WBC, HGB, HCT, PLT in the last 8760 hours.  COAGS: No results for input(s): INR, APTT in the last 8760 hours.  BMP: No results for input(s): NA, K, CL, CO2, GLUCOSE, BUN, CALCIUM, CREATININE, GFRNONAA, GFRAA in the last 8760 hours.  Invalid input(s): CMP  LIVER FUNCTION TESTS: No results for input(s): BILITOT, AST, ALT, ALKPHOS, PROT, ALBUMIN in the last 8760 hours.  TUMOR MARKERS: No results for input(s): AFPTM, CEA, CA199, CHROMGRNA in the last 8760 hours.  Assessment and Plan:  Known skin cancer Persistent right parotid mass--- noted on CT Scheduled for biopsy of same Risks and benefits of right parotid mass biopsy was discussed with the patient and/or  patient's family including, but not limited to bleeding, infection, damage to adjacent structures or low yield requiring additional tests.  All of the questions were answered and there is agreement to proceed. Consent signed and in chart.    Thank you for this interesting consult.  I greatly enjoyed meeting LORANZA LASKER and look forward to participating in their care.  A copy of this report was sent to the requesting provider on this date.  Electronically Signed: Lavonia Drafts, PA-C 11/25/2020, 11:53 AM   I spent a total of  30 Minutes   in face to face in clinical consultation, greater than 50% of which was counseling/coordinating care for right parotid mass biopsy

## 2020-11-27 ENCOUNTER — Other Ambulatory Visit: Payer: Self-pay | Admitting: Interventional Cardiology

## 2020-11-27 DIAGNOSIS — E785 Hyperlipidemia, unspecified: Secondary | ICD-10-CM

## 2020-11-27 DIAGNOSIS — H02831 Dermatochalasis of right upper eyelid: Secondary | ICD-10-CM | POA: Diagnosis not present

## 2020-11-27 DIAGNOSIS — H11432 Conjunctival hyperemia, left eye: Secondary | ICD-10-CM | POA: Diagnosis not present

## 2020-11-27 LAB — CYTOLOGY - NON PAP

## 2020-11-29 ENCOUNTER — Encounter: Payer: Self-pay | Admitting: Interventional Cardiology

## 2020-11-29 ENCOUNTER — Other Ambulatory Visit: Payer: Self-pay

## 2020-11-29 ENCOUNTER — Ambulatory Visit: Payer: PPO | Admitting: Interventional Cardiology

## 2020-11-29 VITALS — BP 140/90 | HR 56 | Ht 70.0 in | Wt 209.0 lb

## 2020-11-29 DIAGNOSIS — I444 Left anterior fascicular block: Secondary | ICD-10-CM

## 2020-11-29 DIAGNOSIS — I25119 Atherosclerotic heart disease of native coronary artery with unspecified angina pectoris: Secondary | ICD-10-CM

## 2020-11-29 DIAGNOSIS — Z01810 Encounter for preprocedural cardiovascular examination: Secondary | ICD-10-CM | POA: Diagnosis not present

## 2020-11-29 DIAGNOSIS — E7800 Pure hypercholesterolemia, unspecified: Secondary | ICD-10-CM | POA: Diagnosis not present

## 2020-11-29 DIAGNOSIS — E7849 Other hyperlipidemia: Secondary | ICD-10-CM

## 2020-11-29 DIAGNOSIS — I10 Essential (primary) hypertension: Secondary | ICD-10-CM

## 2020-11-29 DIAGNOSIS — Z01818 Encounter for other preprocedural examination: Secondary | ICD-10-CM

## 2020-11-29 MED ORDER — ATORVASTATIN CALCIUM 10 MG PO TABS: ORAL_TABLET | ORAL | 3 refills | Status: DC

## 2020-11-29 NOTE — Progress Notes (Signed)
Cardiology Office Note:    Date:  11/29/2020   ID:  Steven Yates, DOB 1940/12/30, MRN [id number]  PCP:  Janie Morning, DO  Cardiologist:  Sinclair Grooms, MD   Referring MD: [name], MD   Chief Complaint  Patient presents with   Coronary Artery Disease   Advice Only    Preop clearance for neck surgery     History of Present Illness:    Steven Yates is a 80 y.o. male with a hx of  ED, DMT2, CAD s/p CTO PCI to LAD with DESx2 (03/2014), untreated hypertension, HLD, LAFB and multiple medication intolerances.   He has chronic dyspnea on exertion.  This is not changed.  It has not impacted his physical activities.  Still playing golf.  No chest pain, orthopnea, or PND.  Denies lower extremity swelling.  No palpitations or syncope.  Not having claudication.  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 Tamala Julian   CARDIAC CATHETERIZATION  04/04/2014   Procedure: CORONARY/BYPASS GRAFT CTO INTERVENTION;  Surgeon: Peter M Martinique, MD;  Location: [facility] CATH LAB;  Service: Cardiovascular;;   CARPAL TUNNEL RELEASE     bil   2019   CHOLECYSTECTOMY  2010   CORONARY ANGIOPLASTY WITH STENT PLACEMENT  04/04/2014   "2"   INGUINAL HERNIA REPAIR Bilateral 1966?   INTRAVASCULAR PRESSURE WIRE/FFR STUDY N/A 09/01/2016   Procedure: Intravascular Pressure Wire/FFR Study;  Surgeon: Belva Crome, MD;  Location: [place] CV LAB;  Service: Cardiovascular;  Laterality: N/A;   LEFT HEART CATH AND CORONARY ANGIOGRAPHY N/A 09/01/2016   Procedure: Left Heart Cath and Coronary Angiography;  Surgeon:  Belva Crome, MD;  Location: [place] CV LAB;  Service: Cardiovascular;  Laterality: N/A;   LEFT HEART CATHETERIZATION WITH CORONARY ANGIOGRAM N/A 01/30/2014   Procedure: LEFT HEART CATHETERIZATION WITH CORONARY ANGIOGRAM;  Surgeon: Sinclair Grooms, MD;  Location: [facility] CATH LAB;  Service: Cardiovascular;  Laterality: N/A;   LUMBAR DISC SURGERY  1980's X 2   "ruptured disc"   PERCUTANEOUS CORONARY STENT INTERVENTION (PCI-S)  01/30/2014   Procedure: PERCUTANEOUS CORONARY STENT INTERVENTION (PCI-S);  Surgeon: Sinclair Grooms, MD;  Location: [facility] CATH LAB;  Service: Cardiovascular;;   SHOULDER OPEN ROTATOR CUFF REPAIR Right 1990's   TOTAL KNEE ARTHROPLASTY Left 06/06/2018   Procedure: TOTAL KNEE ARTHROPLASTY;  Surgeon: Gaynelle Arabian, MD;  Location: [facility] ORS;  Service: Orthopedics;  Laterality: Left;  75mn   ULTRASOUND GUIDANCE FOR VASCULAR ACCESS  09/01/2016   Procedure: Ultrasound Guidance For Vascular Access;  Surgeon: SBelva Crome MD;  [facility];  Service: Cardiovascular;;    Current Medications: Current Meds  Medication Sig   acetaminophen (TYLENOL) 500 MG tablet Take 500 mg by mouth every 6 (six) hours as needed for moderate pain or headache.   aspirin EC 81 MG tablet Take 81 mg by mouth daily. Swallow whole.   atorvastatin (LIPITOR) 10 MG tablet TAKE 1 TABLET(10 MG) BY MOUTH  DAILY   atorvastatin (LIPITOR) 40 MG tablet Take 40 mg by mouth daily.   glimepiride (AMARYL) 1 MG tablet Take 1 tablet by mouth every morning.   losartan-hydrochlorothiazide (HYZAAR) 50-12.5 MG tablet Take 1 tablet by mouth daily.   metFORMIN (GLUCOPHAGE) 500 MG tablet Take 1,000 mg by mouth daily with breakfast.    nitroGLYCERIN (NITROSTAT) 0.4 MG SL tablet Place 1 tablet (0.4 mg total) under the tongue every 5 (five) minutes as needed for chest pain.   pantoprazole (PROTONIX) 40 MG tablet Take 1 tablet (40 mg total) by mouth daily.     Allergies:   Patient has no known allergies.   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  Tobacco Use   Smoking status: Former    Types: Cigars    Quit date: 04/29/2018    Years since quitting: 2.5   Smokeless tobacco: Never  Vaping Use   Vaping Use: Never used  Substance and Sexual Activity   Alcohol use: No    Alcohol/week: 0.0 standard drinks   Drug use: No   Sexual activity: Yes  Other Topics Concern   Not on file  Social History Narrative   Not on file   Social Determinants of Health   Financial Resource Strain: Not on file  Food Insecurity: Not on file  Transportation Needs: Not on file  Physical Activity: Not on file  Stress: Not on file  Social Connections: Not on file     Family History: The patient's family history includes Cancer in his brother; Emphysema in his father; Heart disease in his brother and mother; Other in his sister.  ROS:   Please see the history of present illness.    Has a small nodule in the submandibular area of the right jaw.  Has had biopsies and scanning but still unsure what it is.  Plan now to have surgery by Dr. Arville Care.  He asked that we cleared the patient for surgery.  All other systems reviewed and are negative.  EKGs/Labs/Other Studies Reviewed:    The following studies were reviewed today: No new cardiac imaging  EKG:  EKG sinus bradycardia, 56 bpm, PACs, left axis deviation, interventricular conduction delay.  No major change when compared to prior.  Recent Labs: No results found for requested labs within last 8760 hours.  Recent Lipid Panel    Component Value Date/Time   CHOL 90 (L) 07/18/2015 0724   TRIG 149 07/18/2015 0724   HDL 26 (L) 07/18/2015 0724   CHOLHDL 3.5 07/18/2015 0724   VLDL 30 07/18/2015 0724   LDLCALC 34 07/18/2015 0724    Physical Exam:     VS:  BP 140/90   Pulse (!) 56   Ht '5\' 10"'$  (1.778 m)   Wt 209 lb (94.8 kg)   SpO2 98%   BMI 29.99 kg/m     Wt Readings from Last 3 Encounters:  11/29/20 209 lb (94.8 kg)  06/16/19 214 lb 12.8 oz (97.4 kg)  06/06/18 208 lb 1 oz (94.4 kg)     GEN: Overweight. No acute distress HEENT: Normal NECK: No JVD. LYMPHATICS: No lymphadenopathy CARDIAC: No murmur. RRR no gallop, or edema. VASCULAR:  Normal Pulses. No bruits. RESPIRATORY:  Clear to auscultation without rales,  wheezing or rhonchi  ABDOMEN: Soft, non-tender, non-distended, No pulsatile mass, MUSCULOSKELETAL: No deformity  SKIN: Warm and dry NEUROLOGIC:  Alert and oriented x 3 PSYCHIATRIC:  Normal affect   ASSESSMENT:    1. Coronary artery disease involving native coronary artery of native heart with angina pectoris ([place])   2. Left anterior fascicular hemiblock   3. Essential hypertension   4. Other hyperlipidemia   5. Preoperative clearance    PLAN:    In order of problems listed above:  Continue physical activity.  150 minutes of more physical activity per week.  Secondary prevention discussed. EKG is unchanged. Blood pressure is well controlled for age.  No additions.  Continue Hyzaar 50/12.5 mg/day. Continue Lipitor 10 mg/day.  Last LDL cholesterol was 56 in April. Cleared for upcoming resection of small nodule in the right submandibular area by Dr. Arville Care.   Overall education and awareness concerning secondary risk prevention was discussed in detail: LDL less than 70, hemoglobin A1c less than 7, blood pressure target less than 130/80 mmHg, >150 minutes of moderate aerobic activity per week, avoidance of smoking, weight control (via diet and exercise), and continued surveillance/management of/for obstructive sleep apnea.   Cardiology clinical follow-up in 1 year   Medication Adjustments/Labs and Tests Ordered: Current medicines are reviewed at length with the patient today.  Concerns regarding  medicines are outlined above.  Orders Placed This Encounter  Procedures   EKG 12-Lead   No orders of the defined types were placed in this encounter.   There are no Patient Instructions on file for this visit.   Signed, Sinclair Grooms, MD  11/29/2020 3:02 PM    [facility]

## 2020-11-29 NOTE — Patient Instructions (Signed)

## 2020-12-12 DIAGNOSIS — R221 Localized swelling, mass and lump, neck: Secondary | ICD-10-CM | POA: Diagnosis not present

## 2020-12-12 DIAGNOSIS — K118 Other diseases of salivary glands: Secondary | ICD-10-CM | POA: Diagnosis not present

## 2020-12-12 DIAGNOSIS — Z85828 Personal history of other malignant neoplasm of skin: Secondary | ICD-10-CM | POA: Diagnosis not present

## 2020-12-30 DIAGNOSIS — M109 Gout, unspecified: Secondary | ICD-10-CM | POA: Diagnosis not present

## 2020-12-30 DIAGNOSIS — Z79899 Other long term (current) drug therapy: Secondary | ICD-10-CM | POA: Diagnosis not present

## 2020-12-30 DIAGNOSIS — E1159 Type 2 diabetes mellitus with other circulatory complications: Secondary | ICD-10-CM | POA: Diagnosis not present

## 2020-12-30 DIAGNOSIS — Z955 Presence of coronary angioplasty implant and graft: Secondary | ICD-10-CM | POA: Diagnosis not present

## 2021-01-10 DIAGNOSIS — E785 Hyperlipidemia, unspecified: Secondary | ICD-10-CM | POA: Diagnosis not present

## 2021-01-10 DIAGNOSIS — M109 Gout, unspecified: Secondary | ICD-10-CM | POA: Diagnosis not present

## 2021-01-10 DIAGNOSIS — N529 Male erectile dysfunction, unspecified: Secondary | ICD-10-CM | POA: Diagnosis not present

## 2021-01-10 DIAGNOSIS — E1159 Type 2 diabetes mellitus with other circulatory complications: Secondary | ICD-10-CM | POA: Diagnosis not present

## 2021-01-10 DIAGNOSIS — K219 Gastro-esophageal reflux disease without esophagitis: Secondary | ICD-10-CM | POA: Diagnosis not present

## 2021-01-10 DIAGNOSIS — Z125 Encounter for screening for malignant neoplasm of prostate: Secondary | ICD-10-CM | POA: Diagnosis not present

## 2021-01-10 DIAGNOSIS — R946 Abnormal results of thyroid function studies: Secondary | ICD-10-CM | POA: Diagnosis not present

## 2021-01-10 DIAGNOSIS — I10 Essential (primary) hypertension: Secondary | ICD-10-CM | POA: Diagnosis not present

## 2021-01-10 DIAGNOSIS — Z79899 Other long term (current) drug therapy: Secondary | ICD-10-CM | POA: Diagnosis not present

## 2021-01-10 DIAGNOSIS — Z955 Presence of coronary angioplasty implant and graft: Secondary | ICD-10-CM | POA: Diagnosis not present

## 2021-01-10 DIAGNOSIS — Z23 Encounter for immunization: Secondary | ICD-10-CM | POA: Diagnosis not present

## 2021-01-10 DIAGNOSIS — M159 Polyosteoarthritis, unspecified: Secondary | ICD-10-CM | POA: Diagnosis not present

## 2021-01-14 DIAGNOSIS — H02831 Dermatochalasis of right upper eyelid: Secondary | ICD-10-CM | POA: Diagnosis not present

## 2021-01-14 DIAGNOSIS — H02135 Senile ectropion of left lower eyelid: Secondary | ICD-10-CM | POA: Diagnosis not present

## 2021-01-14 DIAGNOSIS — H02132 Senile ectropion of right lower eyelid: Secondary | ICD-10-CM | POA: Diagnosis not present

## 2021-01-14 DIAGNOSIS — H02834 Dermatochalasis of left upper eyelid: Secondary | ICD-10-CM | POA: Diagnosis not present

## 2021-01-24 NOTE — H&P (Signed)
HPI:   Steven Yates is a 80 y.o. male who presents as a consult Patient.   Referring Provider: Renaldo Reel, DO  Chief complaint: Parotid mass.  HPI: Couple of months ago he noticed a lump along the angle of the jaw on the right side. He was treated with antibiotics and it felt as though it went away. He had a examination with ultrasound followed by a CT scan. The CT reveals a 10 mm hypodense lymph node in the deep right parotid gland. 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 As far as he knows he has not had melanoma or squamous cell cancer on the face or scalp.  PMH/Meds/All/SocHx/FamHx/ROS:   Past Medical History:  Diagnosis Date   Allergy   Cancer ([place])   Hypertension   Past Surgical History:  Procedure Laterality Date   CARDIAC SURGERY   CARPAL TUNNEL RELEASE Bilateral   COLONOSCOPY   ENDOSCOPY   KNEE SURGERY Left   SHOULDER SURGERY Right   No family history of bleeding disorders, wound healing problems or difficulty with anesthesia.   Social History   Socioeconomic History   Marital status: Married  Spouse name: Not on file   Number of children: Not on file   Years of education: Not on file   Highest education level: Not on file  Occupational History   Not on file  Tobacco Use   Smoking status: Former Smoker  Types: Cigars   Smokeless tobacco: Never Used  Substance and Sexual Activity   Alcohol use: Never   Drug use: Never   Sexual activity: Not on file  Other Topics Concern   Not on file  Social History Narrative   Not on file   Social Determinants of Health   Financial Resource Strain: Not on file  Food Insecurity: Not on file  Transportation Needs: Not on file  Physical Activity: Not on file  Stress: Not on file  Social Connections: Not on file  Housing Stability: Not on file   Current Outpatient Medications:   atorvastatin (LIPITOR) 10 MG tablet, atorvastatin 10 mg tablet, Disp: ,  Rfl:   losartan-hydrochlorothiazide (HYZAAR) 50-12.5 mg per tablet, losartan 50 mg-hydrochlorothiazide 12.5 mg tablet TAKE 1 TABLET BY MOUTH DAILY, Disp: , Rfl:   aspirin 81 mg capsule *ANTIPLATELET*, 1 tablet, Disp: , Rfl:   glimepiride (AMARYL) 1 MG tablet, glimepiride 1 mg tablet TAKE 1 TABLET BY MOUTH EVERY DAY WITH BREAKFAST OR THE FIRST MAIN MEAL OF THE DAY, Disp: , Rfl:   indomethacin (INDOCIN SR) 75 mg CR capsule, indomethacin ER 75 mg capsule,extended release TK 1 C PO QD, Disp: , Rfl:   metFORMIN (GLUCOPHAGE) 500 MG tablet, metformin 500 mg tablet TAKE 1 TABLET BY MOUTH TWICE DAILY WITH MEALS, Disp: , Rfl:   pantoprazole (PROTONIX) 40 MG tablet, pantoprazole 40 mg tablet,delayed release TAKE 1 TABLET BY MOUTH EVERY DAY, Disp: , Rfl:   A complete ROS was performed with pertinent positives/negatives noted in the HPI. The remainder of the ROS are negative.   Physical Exam:   BP 164/80 (Site: Right arm, Position: Sitting)  Pulse 59  Temp 97.8 F (36.6 C)  Ht 1.778 m (5\' 10" )  Wt 96.2 kg (212 lb)  BMI 30.42 kg/m   General: Healthy and alert, in no distress, breathing easily. Normal affect. In a pleasant mood. Head: Normocephalic, atraumatic. No masses, or scars. Eyes: Pupils are equal, and reactive to light. Vision is  grossly intact. No spontaneous or gaze nystagmus. Ears: Ear canals are clear. Tympanic membranes are intact, with normal landmarks and the middle ears are clear and healthy. Hearing: Grossly normal. Nose: Nasal cavities are clear with healthy mucosa, no polyps or exudate. Airways are patent. Face: No masses or scars, facial nerve function is symmetric. Oral Cavity: No mucosal abnormalities are noted. Tongue with normal mobility. Dentition appears healthy. Oropharynx: Tonsils are symmetric. There are no mucosal masses identified. Tongue base appears normal and healthy. Larynx/Hypopharynx: deferred Chest: Deferred Neck: There is a 1 cm deep parotid tail mass on the  right, otherwise no adenopathy or masses palpable, no thyroid nodules or enlargement. Neuro: Cranial nerves II-XII with normal function. Balance: Normal gate. Other findings: none.  Independent Review of Additional Tests or Records:  none  Procedures:  none  Impression & Plans:  Parotid mass. It is encouraging that it shrunk down significantly with antibiotic therapy but I am concerned about his history of facial skin cancers. Recommend a fine-needle aspiration biopsy with ultrasound guidance. We will discuss results when available.

## 2021-01-27 NOTE — H&P (Signed)
HPI:   Steven Yates is a 80 y.o. male who presents as a consult Patient.   Referring Provider: Renaldo Reel, DO  Chief complaint: Parotid mass.  HPI: Couple of months ago he noticed a lump along the angle of the jaw on the right side. He was treated with antibiotics and it felt as though it went away. He had a examination with ultrasound followed by a CT scan. The CT reveals a 10 mm hypodense lymph node in the deep right parotid gland. 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 As far as he knows he has not had melanoma or squamous cell cancer on the face or scalp.  PMH/Meds/All/SocHx/FamHx/ROS:   Past Medical History:  Diagnosis Date   Allergy   Cancer ([place])   Hypertension   Past Surgical History:  Procedure Laterality Date   CARDIAC SURGERY   CARPAL TUNNEL RELEASE Bilateral   COLONOSCOPY   ENDOSCOPY   KNEE SURGERY Left   SHOULDER SURGERY Right   No family history of bleeding disorders, wound healing problems or difficulty with anesthesia.   Social History   Socioeconomic History   Marital status: Married  Spouse name: Not on file   Number of children: Not on file   Years of education: Not on file   Highest education level: Not on file  Occupational History   Not on file  Tobacco Use   Smoking status: Former Smoker  Types: Cigars   Smokeless tobacco: Never Used  Substance and Sexual Activity   Alcohol use: Never   Drug use: Never   Sexual activity: Not on file  Other Topics Concern   Not on file  Social History Narrative   Not on file   Social Determinants of Health   Financial Resource Strain: Not on file  Food Insecurity: Not on file  Transportation Needs: Not on file  Physical Activity: Not on file  Stress: Not on file  Social Connections: Not on file  Housing Stability: Not on file   Current Outpatient Medications:   atorvastatin (LIPITOR) 10 MG tablet, atorvastatin 10 mg tablet, Disp: ,  Rfl:   losartan-hydrochlorothiazide (HYZAAR) 50-12.5 mg per tablet, losartan 50 mg-hydrochlorothiazide 12.5 mg tablet TAKE 1 TABLET BY MOUTH DAILY, Disp: , Rfl:   aspirin 81 mg capsule *ANTIPLATELET*, 1 tablet, Disp: , Rfl:   glimepiride (AMARYL) 1 MG tablet, glimepiride 1 mg tablet TAKE 1 TABLET BY MOUTH EVERY DAY WITH BREAKFAST OR THE FIRST MAIN MEAL OF THE DAY, Disp: , Rfl:   indomethacin (INDOCIN SR) 75 mg CR capsule, indomethacin ER 75 mg capsule,extended release TK 1 C PO QD, Disp: , Rfl:   metFORMIN (GLUCOPHAGE) 500 MG tablet, metformin 500 mg tablet TAKE 1 TABLET BY MOUTH TWICE DAILY WITH MEALS, Disp: , Rfl:   pantoprazole (PROTONIX) 40 MG tablet, pantoprazole 40 mg tablet,delayed release TAKE 1 TABLET BY MOUTH EVERY DAY, Disp: , Rfl:   A complete ROS was performed with pertinent positives/negatives noted in the HPI. The remainder of the ROS are negative.   Physical Exam:   BP 164/80 (Site: Right arm, Position: Sitting)  Pulse 59  Temp 97.8 F (36.6 C)  Ht 1.778 m (5\' 10" )  Wt 96.2 kg (212 lb)  BMI 30.42 kg/m   General: Healthy and alert, in no distress, breathing easily. Normal affect. In a pleasant mood. Head: Normocephalic, atraumatic. No masses, or scars. Eyes: Pupils are equal, and reactive to light. Vision is  grossly intact. No spontaneous or gaze nystagmus. Ears: Ear canals are clear. Tympanic membranes are intact, with normal landmarks and the middle ears are clear and healthy. Hearing: Grossly normal. Nose: Nasal cavities are clear with healthy mucosa, no polyps or exudate. Airways are patent. Face: No masses or scars, facial nerve function is symmetric. Oral Cavity: No mucosal abnormalities are noted. Tongue with normal mobility. Dentition appears healthy. Oropharynx: Tonsils are symmetric. There are no mucosal masses identified. Tongue base appears normal and healthy. Larynx/Hypopharynx: deferred Chest: Deferred Neck: There is a 1 cm deep parotid tail mass on the  right, otherwise no adenopathy or masses palpable, no thyroid nodules or enlargement. Neuro: Cranial nerves II-XII with normal function. Balance: Normal gate. Other findings: none.  Independent Review of Additional Tests or Records:  none  Procedures:  none  Impression & Plans:  Parotid mass. It is encouraging that it shrunk down significantly with antibiotic therapy but I am concerned about his history of facial skin cancers. Recommend a fine-needle aspiration biopsy with ultrasound guidance. We will discuss results when available.

## 2021-02-04 ENCOUNTER — Other Ambulatory Visit (HOSPITAL_COMMUNITY): Payer: PPO

## 2021-02-07 ENCOUNTER — Ambulatory Visit (HOSPITAL_COMMUNITY): Admission: RE | Admit: 2021-02-07 | Payer: PPO | Source: Home / Self Care | Admitting: Otolaryngology

## 2021-02-07 ENCOUNTER — Encounter (HOSPITAL_COMMUNITY): Admission: RE | Payer: Self-pay | Source: Home / Self Care

## 2021-02-07 SURGERY — EXCISION, PAROTID GLAND
Anesthesia: General | Laterality: Right

## 2021-02-28 ENCOUNTER — Ambulatory Visit: Payer: PPO | Admitting: Family Medicine

## 2021-03-07 ENCOUNTER — Ambulatory Visit: Payer: PPO | Admitting: Family Medicine

## 2021-03-12 DIAGNOSIS — H353112 Nonexudative age-related macular degeneration, right eye, intermediate dry stage: Secondary | ICD-10-CM | POA: Diagnosis not present

## 2021-03-12 DIAGNOSIS — H2513 Age-related nuclear cataract, bilateral: Secondary | ICD-10-CM | POA: Diagnosis not present

## 2021-03-12 DIAGNOSIS — E119 Type 2 diabetes mellitus without complications: Secondary | ICD-10-CM | POA: Diagnosis not present

## 2021-03-12 DIAGNOSIS — H353221 Exudative age-related macular degeneration, left eye, with active choroidal neovascularization: Secondary | ICD-10-CM | POA: Diagnosis not present

## 2021-07-25 ENCOUNTER — Other Ambulatory Visit: Payer: Self-pay | Admitting: *Deleted

## 2021-07-25 MED ORDER — LOSARTAN POTASSIUM-HCTZ 50-12.5 MG PO TABS: 1.0000 | ORAL_TABLET | Freq: Every day | ORAL | 1 refills | Status: DC

## 2021-07-29 ENCOUNTER — Ambulatory Visit (INDEPENDENT_AMBULATORY_CARE_PROVIDER_SITE_OTHER): Payer: PPO | Admitting: Family Medicine

## 2021-07-29 ENCOUNTER — Encounter: Payer: Self-pay | Admitting: Family Medicine

## 2021-07-29 VITALS — BP 152/80 | HR 67 | Temp 97.4°F | Ht 70.0 in | Wt 208.6 lb

## 2021-07-29 DIAGNOSIS — I25119 Atherosclerotic heart disease of native coronary artery with unspecified angina pectoris: Secondary | ICD-10-CM

## 2021-07-29 DIAGNOSIS — E782 Mixed hyperlipidemia: Secondary | ICD-10-CM

## 2021-07-29 DIAGNOSIS — I10 Essential (primary) hypertension: Secondary | ICD-10-CM

## 2021-07-29 DIAGNOSIS — E1169 Type 2 diabetes mellitus with other specified complication: Secondary | ICD-10-CM

## 2021-07-29 DIAGNOSIS — E119 Type 2 diabetes mellitus without complications: Secondary | ICD-10-CM | POA: Insufficient documentation

## 2021-07-29 NOTE — Progress Notes (Signed)
? ?New Patient Office Visit ? ?Subjective   ? ?Patient ID: Steven Ginger. Paige, male    DOB: Oct 22, 1940  Age: 81 y.o. MRN: [id number] ? ?CC:  ?Chief Complaint  ?Patient presents with  ? Establish Care  ?  NP/establish care, no concerns.   ? ? ?HPI ?Steven Yates presents to establish care ?Presents with a history of CAD with stent placement some years ago.  He has no history of MI or CHF.  History of well-controlled diabetes with Amaryl and metformin.  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  Brings in paperwork with a hemoglobin A1c of 6 and an LDL cholesterol of 88.  These labs were drawn 2 weeks ago.  Blood pressure at home it typically runs in the 130s/70s.  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  [employer].  He is accompanied by his wife to my patient.  ? ?Outpatient Encounter Medications as of 07/29/2021  ?Medication Sig  ? aspirin EC 81 MG tablet Take 81 mg by mouth daily. Swallow whole.  ? atorvastatin (LIPITOR) 10 MG tablet TAKE 1 TABLET(10 MG) BY MOUTH DAILY  ? glimepiride (AMARYL) 1 MG tablet Take 1 tablet by mouth every morning.  ? losartan-hydrochlorothiazide (HYZAAR) 50-12.5 MG tablet Take 1 tablet by mouth daily.  ? metFORMIN (GLUCOPHAGE) 500 MG tablet Take 1,000 mg by mouth daily with breakfast.   ? Multiple Vitamins-Minerals (ICAPS AREDS 2 PO) Take 1 capsule by mouth in the morning and at bedtime.  ? pantoprazole (PROTONIX) 40 MG tablet Take 1 tablet (40 mg total) by mouth daily.  ? [DISCONTINUED] acetaminophen (TYLENOL) 500 MG tablet Take 500 mg by mouth every 6 (six) hours as needed for moderate pain or headache.  ? [DISCONTINUED] gabapentin (NEURONTIN) 300 MG capsule Take 1 capsule (300 mg total) by mouth 3 (three) times daily. Take a 300 mg capsule three times a day for two weeks following surgery.Then take a 300 mg capsule two times a day for two weeks. Then take a 300 mg capsule once a day for two weeks. Then  discontinue. (Patient not taking: No sig reported)  ? [DISCONTINUED] nitroGLYCERIN (NITROSTAT) 0.4 MG SL tablet Place 1 tablet (0.4 mg total) under the tongue every 5 (five) minutes as needed for chest pain.  ? ?No facility-administered encounter medications on file as of 07/29/2021.  ? ? ?Past Medical History:  ?Diagnosis Date  ? Arthritis   ? Coronary artery disease   ? 03-2014 2 stents  ? Diabetes mellitus without complication ([place])   ? type 2  ? Gastroesophageal reflux 01/19/2014  ? Gout   ? HTN (hypertension)   ? Hypercholesterolemia   ? 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 Pseudoinfarction pattern V1 and V2   ? ? ?Past Surgical History:  ?Procedure Laterality Date  ? BACK SURGERY    ? x2  ? CARDIAC CATHETERIZATION  01/2014  ? Dr. Tamala Julian  ? CARDIAC CATHETERIZATION  04/04/2014  ? Procedure: CORONARY/BYPASS GRAFT CTO INTERVENTION;  Surgeon: Peter M Martinique, MD;  Location: [facility] CATH LAB;  Service: Cardiovascular;;  ? CARPAL TUNNEL RELEASE    ? bil   2019  ? CHOLECYSTECTOMY  2010  ? CORONARY ANGIOPLASTY WITH STENT PLACEMENT  04/04/2014  ? "2"  ? INGUINAL HERNIA REPAIR Bilateral 1966?  ? INTRAVASCULAR PRESSURE WIRE/FFR STUDY N/A 09/01/2016  ? Procedure: Intravascular Pressure Wire/FFR Study;  Surgeon: Belva Crome, MD;  Location: [place] CV LAB;  Service: Cardiovascular;  Laterality: N/A;  ? LEFT HEART CATH AND CORONARY ANGIOGRAPHY N/A 09/01/2016  ? Procedure: Left Heart Cath and Coronary Angiography;  Surgeon: Belva Crome, MD;  Location: [place] CV LAB;  Service: Cardiovascular;  Laterality: N/A;  ? LEFT HEART CATHETERIZATION WITH CORONARY ANGIOGRAM N/A 01/30/2014  ? Procedure: LEFT HEART CATHETERIZATION WITH CORONARY ANGIOGRAM;  Surgeon: Sinclair Grooms, MD;  Location: [facility] CATH LAB;  Service: Cardiovascular;  Laterality: N/A;  ? LUMBAR DISC SURGERY  1980's X 2  ? "ruptured disc"  ? PERCUTANEOUS CORONARY STENT INTERVENTION (PCI-S)  01/30/2014  ? Procedure: PERCUTANEOUS CORONARY STENT INTERVENTION (PCI-S);   Surgeon: Sinclair Grooms, MD;  Location: [facility] CATH LAB;  Service: Cardiovascular;;  ? SHOULDER OPEN ROTATOR CUFF REPAIR Right 1990's  ? TOTAL KNEE ARTHROPLASTY Left 06/06/2018  ? Procedure: TOTAL KNEE ARTHROPLASTY;  Surgeon: Gaynelle Arabian, MD;  Location: [facility] ORS;  Service: Orthopedics;  Laterality: Left;  95mn  ? ULTRASOUND GUIDANCE FOR VASCULAR ACCESS  09/01/2016  ? Procedure: Ultrasound Guidance For Vascular Access;  Surgeon: SBelva Crome MD;  [facility];  Service: Cardiovascular;;  ? ? ?Family History  ?Problem Relation Age of Onset  ? Emphysema Father   ? Heart disease Mother   ? Heart disease Brother   ? Cancer Brother   ? Other Sister   ?     GI DISEASE  ? ? ?Social History  ? ?Socioeconomic History  ? Marital status: Married  ?  Spouse name: Not on file  ? Number of children: 2  ? Years of education: Not on file  ? Highest education level: Not on file  ?Occupational History  ? 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 Smoking status: Former  ?  Types: Cigars  ?  Quit date: 04/29/2018  ?  Years since quitting: 3.2  ? Smokeless tobacco: Never  ?Vaping Use  ? Vaping Use: Never used  ?Substance and Sexual Activity  ? Alcohol use: No  ?  Alcohol/week: 0.0 standard drinks  ? Drug use: No  ? Sexual activity: Yes  ?Other Topics Concern  ? Not on file  ?Social History Narrative  ? Not on file  ? ?Social Determinants of Health  ? ?Financial Resource Strain: Not on file  ?Food Insecurity: Not on file  ?Transportation Needs: Not on file  ?Physical Activity: Not on file  ?Stress: Not on file  ?Social Connections: Not on file  ?Intimate Partner Violence: Not on file  ? ? ?Review of Systems  ?Constitutional: Negative.   ?HENT: Negative.    ?Eyes:  Negative for blurred vision, discharge and redness.  ?Respiratory: Negative.    ?Cardiovascular: Negative.   ?Gastrointestinal:  Negative for abdominal pain.  ?Genitourinary: Negative.   ?Musculoskeletal: Negative.  Negative for myalgias.  ?Skin:   Negative for rash.  ?Neurological:  Negative for tingling, loss of consciousness and weakness.  ?Endo/Heme/Allergies:  Negative for polydipsia.  ? ?  ? ? ?Objective   ? ?BP (!) 152/80 (BP Location: Left Arm, Patient Position: Sitting, Cuff Size: Large)   Pulse 67   Temp (!) 97.4 ?F (36.3 ?C) (Temporal)   Ht '5\' 10"'$  (1.778 m)   Wt 208 lb 9.6 oz (94.6 kg)   SpO2 97%   BMI 29.93 kg/m?  ? ?Physical Exam ?Constitutional:   ?   General: He is not in acute distress. ?   Appearance: Normal appearance. He is not ill-appearing, toxic-appearing or diaphoretic.  ?HENT:  ?  Head: Normocephalic and atraumatic.  ?   Right Ear: External ear normal.  ?   Left Ear: External ear normal.  ?   Mouth/Throat:  ?   Mouth: Mucous membranes are moist.  ?   Pharynx: Oropharynx is clear. No oropharyngeal exudate or posterior oropharyngeal erythema.  ?Eyes:  ?   General: No scleral icterus.    ?   Right eye: No discharge.     ?   Left eye: No discharge.  ?   Extraocular Movements: Extraocular movements intact.  ?   Conjunctiva/sclera: Conjunctivae normal.  ?   Pupils: Pupils are equal, round, and reactive to light.  ?Neck:  ?   Vascular: No carotid bruit.  ?Cardiovascular:  ?   Rate and Rhythm: Normal rate and regular rhythm.  ?Pulmonary:  ?   Effort: Pulmonary effort is normal. No respiratory distress.  ?   Breath sounds: Normal breath sounds.  ?Abdominal:  ?   General: Bowel sounds are normal.  ?Musculoskeletal:  ?   Cervical back: No rigidity or tenderness.  ?Skin: ?   General: Skin is warm and dry.  ?Neurological:  ?   Mental Status: He is alert and oriented to person, place, and time.  ?Psychiatric:     ?   Mood and Affect: Mood normal.     ?   Behavior: Behavior normal.  ? ? ? ?  ? ?Assessment & Plan:  ? ?Problem List Items Addressed This Visit   ? ?  ? Cardiovascular and Mediastinum  ? Coronary artery disease involving native coronary artery of native heart with angina pectoris ([place])  ? Essential hypertension - Primary  ?  ?  Endocrine  ? Diabetes mellitus ([place])  ?  ? Other  ? Hyperlipidemia  ? ? ?Return in about 6 months (around 01/29/2022).  ?Doing well.  We will continue all medicines as above.  Continue monitoring blood pressure.  We will

## 2021-10-01 ENCOUNTER — Ambulatory Visit: Payer: PPO

## 2021-10-28 ENCOUNTER — Ambulatory Visit (INDEPENDENT_AMBULATORY_CARE_PROVIDER_SITE_OTHER): Payer: PPO

## 2021-10-28 ENCOUNTER — Ambulatory Visit: Payer: Self-pay | Admitting: Surgery

## 2021-10-28 ENCOUNTER — Telehealth: Payer: Self-pay | Admitting: Family Medicine

## 2021-10-28 NOTE — Telephone Encounter (Signed)
Mickel Baas,  You documented below: "States Ethelene Hal not his PCP -L.Wilson LPN "  Who is the PCP? Did pt tell you the providers name? We are responsible to update Epic for current PCP. Pt saw Dr. Ethelene Hal in May 2023 and I do not see other provider noted except at the [place].  Thank you.

## 2021-10-28 NOTE — Progress Notes (Signed)
States Steven Yates not his PCP -L.Wilson LPN

## 2021-10-29 ENCOUNTER — Telehealth: Payer: Self-pay | Admitting: *Deleted

## 2021-10-29 NOTE — Telephone Encounter (Signed)
   Pre-operative Risk Assessment    Patient Name: Steven Yates. Wheelwright  DOB: [birth year] MRN: [id number]      Request for Surgical Clearance    Procedure:   HEMORRHOIDECTOMY  Date of Surgery:  Clearance TBD                                 Surgeon:  DR. Michael Boston Surgeon's Group or Practice Name:  [facility] Phone number:  [phone] Fax number:  [phone] ATTN:  Illene Regulus   Type of Clearance Requested:   - Medical  - Pharmacy:  Hold Aspirin NOT INDICATED   Type of Anesthesia:  General    Additional requests/questions:    Astrid Divine   10/29/2021, 3:05 PM

## 2021-10-29 NOTE — Telephone Encounter (Signed)
Pt has been scheduled to see Christen Bame, NP, 11/11/21, clearance will be addressed at that time.  Will route to the requesting surgeon's office to make them aware.

## 2021-10-29 NOTE — Telephone Encounter (Signed)
   Name: Steven Yates  DOB: 07-Nov-1940  MRN: [id number]  Primary Cardiologist: Sinclair Grooms, MD  Chart reviewed as part of pre-operative protocol coverage. Because of Steven Yates. Steven Yates's past medical history and time since last visit, he will require a follow-up in-office visit in order to better assess preoperative cardiovascular risk.  Pre-op covering staff: - Please schedule appointment and call patient to inform them. If patient already had an upcoming appointment within acceptable timeframe, please add "pre-op clearance" to the appointment notes so provider is aware. - Please contact requesting surgeon's office via preferred method (i.e, phone, fax) to inform them of need for appointment prior to surgery.  Patient with history of remote stenting back in 2016.  Okay to hold ASA x7 days prior to the procedure.  Resume when medically safe to do so.  Elgie Collard, PA-C  10/29/2021, 4:21 PM

## 2021-10-31 NOTE — Telephone Encounter (Signed)
FYI: please see below 

## 2021-11-09 NOTE — Progress Notes (Unsigned)
Cardiology Office Note:    Date:  11/11/2021   ID:  Steven Yates. Taliesin, [place] 1940/11/15, MRN [id number]  PCP:  Libby Maw, MD   [facility] Cardiologist:  Sinclair Grooms, MD     Referring MD: Libby Maw,*   Chief Complaint: preoperative cardiac evlaution  History of Present Illness:    Steven Yates. Steven Yates is a very pleasant 81 y.o. male with a hx of CAD s/p CTO PCI to LAD and DES x 2 (03/2014), HLD,  hypertension, T2DM, LAFB, and chronic DOE.   CAD s/p CTO PCI to LAD and DEX x 2 in 2016. Cardiac catheterization 08/2016 mild to moderate ISR focally in proximal 1/3 and also in distal 1/2 of overlapping LAD stents. FFR across entire stented segment 0.82, widely patent circumflex and RCA, normal LVEF with recommendation to continue medical therapy.   He was last seen in our office by  Dr. Tamala Julian on 11/29/20 at which time he was cleared for resection of small nodule in the right submandibular. No changes to treatment plan and 1 year follow-up recommended.   Today, he is here for preoperative cardiac evaluation for hemorrhoidectomy. Just returned from trip to [place]. 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 Works out at gym on stationary bike and Education officer, community. He denies chest pain, shortness of breath, lower extremity edema, fatigue, palpitations, melena, hematuria, hemoptysis, diaphoresis, weakness, presyncope, syncope, orthopnea, and PND. Has some rectal bleeding from hemorrhoids. Has tried to manage without surgery but has significant pain at times.   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 Tamala Julian   CARDIAC CATHETERIZATION  04/04/2014   Procedure: CORONARY/BYPASS GRAFT CTO INTERVENTION;  Surgeon: Peter M Martinique, MD;  Location: [facility] CATH LAB;  Service: Cardiovascular;;   CARPAL TUNNEL RELEASE     bil   2019   CHOLECYSTECTOMY  2010   CORONARY ANGIOPLASTY WITH STENT PLACEMENT  04/04/2014   "2"   INGUINAL HERNIA REPAIR Bilateral 1966?   INTRAVASCULAR PRESSURE WIRE/FFR STUDY N/A 09/01/2016   Procedure: Intravascular Pressure Wire/FFR Study;  Surgeon: Belva Crome, MD;  Location: [place] CV LAB;  Service: Cardiovascular;  Laterality: N/A;   LEFT HEART CATH AND CORONARY ANGIOGRAPHY N/A 09/01/2016   Procedure: Left Heart Cath and Coronary Angiography;  Surgeon: Belva Crome, MD;  Location: [place] CV LAB;  Service: Cardiovascular;  Laterality: N/A;   LEFT HEART CATHETERIZATION WITH CORONARY ANGIOGRAM N/A 01/30/2014   Procedure: LEFT HEART CATHETERIZATION WITH CORONARY ANGIOGRAM;  Surgeon: Sinclair Grooms, MD;  Location: [facility] CATH LAB;  Service: Cardiovascular;  Laterality: N/A;   LUMBAR DISC SURGERY  1980's X 2   "ruptured disc"   PERCUTANEOUS CORONARY STENT INTERVENTION (PCI-S)  01/30/2014   Procedure: PERCUTANEOUS CORONARY STENT INTERVENTION (PCI-S);  Surgeon: Sinclair Grooms, MD;  Location: [facility] CATH LAB;  Service: Cardiovascular;;   SHOULDER OPEN ROTATOR CUFF REPAIR Right 1990's   TOTAL KNEE ARTHROPLASTY Left 06/06/2018  Procedure: TOTAL KNEE ARTHROPLASTY;  Surgeon: Gaynelle Arabian, MD;  Location: [facility] ORS;  Service: Orthopedics;  Laterality: Left;  43mn   ULTRASOUND GUIDANCE FOR VASCULAR ACCESS  09/01/2016   Procedure: Ultrasound Guidance For Vascular Access;  Surgeon: SBelva Crome MD;  [facility];  Service: Cardiovascular;;    Current Medications: Current Meds  Medication Sig   aspirin EC 81 MG tablet Take 81 mg by mouth daily. Swallow whole.   atorvastatin (LIPITOR) 10 MG tablet TAKE 1 TABLET(10  MG) BY MOUTH DAILY   Faricimab-svoa (VABYSMO IZ) by Intravitreal route every 8 (eight) weeks. One injection in left eye every 8 weeks.   glimepiride (AMARYL) 1 MG tablet Take 1 tablet by mouth every morning.   losartan-hydrochlorothiazide (HYZAAR) 50-12.5 MG tablet Take 1 tablet by mouth daily.   metFORMIN (GLUCOPHAGE) 500 MG tablet Take 1,000 mg by mouth daily with breakfast.    Multiple Vitamins-Minerals (ICAPS AREDS 2 PO) Take 1 capsule by mouth in the morning and at bedtime.   pantoprazole (PROTONIX) 40 MG tablet Take 1 tablet (40 mg total) by mouth daily.     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  Tobacco Use   Smoking status: Former    Types: Cigars    Quit date: 04/29/2018    Years since quitting: 3.5   Smokeless tobacco: Never  Vaping Use   Vaping Use: Never used  Substance and Sexual Activity   Alcohol use: No    Alcohol/week: 0.0 standard drinks of alcohol   Drug use: No   Sexual activity: Yes  Other Topics Concern   Not on file  Social History Narrative   Not on file   Social Determinants of Health   Financial Resource Strain: Not on file  Food Insecurity: Not on file  Transportation Needs: Not on file  Physical Activity: Not on file  Stress: Not on file  Social Connections: Not on file     Family History: The patient's family history includes Cancer in his brother; Emphysema in his father; Heart disease in his brother and mother; Other in his sister.  ROS:   Please see the history of present illness.   All other systems reviewed and are negative.  Labs/Other Studies Reviewed:    The following studies were reviewed today:  LHC 09/01/2016  Mild to moderate in-stent restenosis focally in the proximal one third and also  in the distal one third of the overlapping LAD stents. FFR across the entire stented segment is 0.82. Widely patent circumflex and right coronary artery. Normal left ventricular systolic function.   RECOMMENDATIONS: Continue medical therapy   Recent Labs: No results found for requested labs within last 365 days.  Recent Lipid Panel    Component Value Date/Time   CHOL 90 (L) 07/18/2015 0724   TRIG 149 07/18/2015 0724   HDL 26 (L) 07/18/2015 0724   CHOLHDL 3.5 07/18/2015 0724   VLDL 30 07/18/2015 0724   LDLCALC 34 07/18/2015 0724     Risk Assessment/Calculations:       Physical Exam:    VS:  BP (!) 140/82   Pulse 62   Ht '5\' 10"'$  (1.778 m)   Wt 210 lb (95.3 kg)  SpO2 96%   BMI 30.13 kg/m     Wt Readings from Last 3 Encounters:  11/11/21 210 lb (95.3 kg)  07/29/21 208 lb 9.6 oz (94.6 kg)  11/29/20 209 lb (94.8 kg)     GEN:  Well nourished, well developed in no acute distress HEENT: Normal NECK: No JVD; No carotid bruits CARDIAC: RRR, no murmurs, rubs, gallops RESPIRATORY:  Clear to auscultation without rales, wheezing or rhonchi  ABDOMEN: Soft, non-tender, non-distended MUSCULOSKELETAL:  No edema; No deformity. 2+ pedal pulses, equal bilaterally SKIN: Warm and dry NEUROLOGIC:  Alert and oriented x 3 PSYCHIATRIC:  Normal affect   EKG:  EKG is ordered today.  The ekg ordered today demonstrates SR with PACs at 62 bpm, IVCD, LAD, nonspecific ST/T abnormality  Diagnoses:    1. Preoperative cardiovascular examination   2. Coronary artery disease involving native coronary artery of native heart without angina pectoris   3. Essential hypertension   4. Hyperlipidemia LDL goal <70    Assessment and Plan:     Preoperative cardiac evaluation: The patient is doing well from a cardiac perspective. Therefore, based on ACC/AHA guidelines, the patient would be at acceptable risk for the planned procedure without further cardiovascular testing. The patient was advised that  if he develops new symptoms prior to surgery to contact our office to arrange for a follow-up visit, and he verbalized understanding. According to the Revised Cardiac Risk Index (RCRI), his Perioperative Risk of Major Cardiac Event is (%): 0.4. His Functional Capacity in METs is: 7.59 according to the Duke Activity Status Index (DASI). Ideally aspirin should be continued without interruption, however if the bleeding risk is too great, aspirin may be held for 7 days prior to surgery. Please resume aspirin post operatively when it is felt to be safe from a bleeding standpoint.   CAD without angina: Last cath 2018 with mild to moderate ISR in proximal one third and distal one third of overlapping LAD stents. FFR across entire segment is 0.82, widely patent RCA and LCx. He denies chest pain, dyspnea, or other symptoms concerning for angina.  Can easily achieve > 4 METS activity on a consistent basis. No indication for further ischemic evaluation at this time. Continue aspirin, atorvastatin, Hyzaar.   Hypertension: BP is mildly elevated today at 140/82. Reports BP consistently < 140/80 at home. Admits to stressful driving on the way to appointment. Advised him to continue to monitor and report if BP consistently > 140/80. Continue Hyzaar.   Hyperlipidemia LDL goal < 70: No LDL since April 2022, was 56 at that time.  He is scheduled for lab work with PCP in 2 months. I have asked him to send results to [place]. Continue atorvastatin.   Disposition: 1 year with Dr. Tamala Julian    Medication Adjustments/Labs and Tests Ordered: Current medicines are reviewed at length with the patient today.  Concerns regarding medicines are outlined above.  Orders Placed This Encounter  Procedures   EKG 12-Lead   No orders of the defined types were placed in this encounter.   There are no Patient Instructions on file for this visit.   Signed, Emmaline Life, NP  11/11/2021 2:41 PM    [facility]

## 2021-11-11 ENCOUNTER — Encounter: Payer: Self-pay | Admitting: Nurse Practitioner

## 2021-11-11 ENCOUNTER — Ambulatory Visit (INDEPENDENT_AMBULATORY_CARE_PROVIDER_SITE_OTHER): Payer: PPO | Admitting: Nurse Practitioner

## 2021-11-11 VITALS — BP 140/82 | HR 62 | Ht 70.0 in | Wt 210.0 lb

## 2021-11-11 DIAGNOSIS — Z01810 Encounter for preprocedural cardiovascular examination: Secondary | ICD-10-CM | POA: Diagnosis not present

## 2021-11-11 DIAGNOSIS — I10 Essential (primary) hypertension: Secondary | ICD-10-CM

## 2021-11-11 DIAGNOSIS — I2510 Atherosclerotic heart disease of native coronary artery without angina pectoris: Secondary | ICD-10-CM | POA: Diagnosis not present

## 2021-11-11 DIAGNOSIS — E785 Hyperlipidemia, unspecified: Secondary | ICD-10-CM

## 2021-11-11 NOTE — Patient Instructions (Signed)
Medication Instructions:   Your physician recommends that you continue on your current medications as directed. Please refer to the Current Medication list given to you today.   *If you need a refill on your cardiac medications before your next appointment, please call your pharmacy*   Lab Work:  None ordered.  If you have labs (blood work) drawn today and your tests are completely normal, you will receive your results only by: [facility] (if you have MyChart) OR A paper copy in the mail If you have any lab test that is abnormal or we need to change your treatment, we will call you to review the results.   Testing/Procedures:  None ordered.   Follow-Up: At [facility], you and your health needs are our priority.  As part of our continuing mission to provide you with exceptional heart care, we have created designated Provider Care Teams.  These Care Teams include your primary Cardiologist (physician) and Advanced Practice Providers (APPs -  Physician Assistants and Nurse Practitioners) who all work together to provide you with the care you need, when you need it.  We recommend signing up for the patient portal called "MyChart".  Sign up information is provided on this After Visit Summary.  MyChart is used to connect with patients for Virtual Visits (Telemedicine).  Patients are able to view lab/test results, encounter notes, upcoming appointments, etc.  Non-urgent messages can be sent to your provider as well.   To learn more about what you can do with MyChart, go to [email or web address].    Your next appointment:   1 year(s)  The format for your next appointment:   In Person  Provider:   Sinclair Grooms, MD     Other Instructions  Your physician wants you to follow-up in: 1 year with Dr. Tamala Julian.  You will receive a reminder letter in the mail two months in advance. If you don't receive a letter, please call our office to schedule the follow-up  appointment.   Important Information About Sugar

## 2021-12-06 ENCOUNTER — Other Ambulatory Visit: Payer: Self-pay

## 2021-12-06 ENCOUNTER — Emergency Department (HOSPITAL_COMMUNITY)
Admission: EM | Admit: 2021-12-06 | Discharge: 2021-12-06 | Disposition: A | Discharge status: Home / Self Care | Payer: PPO | Attending: Emergency Medicine | Admitting: Emergency Medicine

## 2021-12-06 ENCOUNTER — Encounter (HOSPITAL_COMMUNITY): Payer: Self-pay

## 2021-12-06 DIAGNOSIS — Z7984 Long term (current) use of oral hypoglycemic drugs: Secondary | ICD-10-CM | POA: Diagnosis not present

## 2021-12-06 DIAGNOSIS — Z79899 Other long term (current) drug therapy: Secondary | ICD-10-CM | POA: Insufficient documentation

## 2021-12-06 DIAGNOSIS — I10 Essential (primary) hypertension: Secondary | ICD-10-CM | POA: Diagnosis not present

## 2021-12-06 DIAGNOSIS — Z7982 Long term (current) use of aspirin: Secondary | ICD-10-CM | POA: Diagnosis not present

## 2021-12-06 DIAGNOSIS — R339 Retention of urine, unspecified: Secondary | ICD-10-CM | POA: Insufficient documentation

## 2021-12-06 DIAGNOSIS — Z48815 Encounter for surgical aftercare following surgery on the digestive system: Secondary | ICD-10-CM | POA: Insufficient documentation

## 2021-12-06 DIAGNOSIS — Z8719 Personal history of other diseases of the digestive system: Secondary | ICD-10-CM

## 2021-12-06 DIAGNOSIS — E119 Type 2 diabetes mellitus without complications: Secondary | ICD-10-CM | POA: Diagnosis not present

## 2021-12-06 DIAGNOSIS — I2510 Atherosclerotic heart disease of native coronary artery without angina pectoris: Secondary | ICD-10-CM | POA: Diagnosis not present

## 2021-12-06 LAB — URINALYSIS, ROUTINE W REFLEX MICROSCOPIC
Bilirubin Urine: NEGATIVE
Glucose, UA: NEGATIVE mg/dL
Hgb urine dipstick: NEGATIVE
Ketones, ur: NEGATIVE mg/dL
Leukocytes,Ua: NEGATIVE
Nitrite: NEGATIVE
Protein, ur: 30 mg/dL — AB
Specific Gravity, Urine: 1.021 (ref 1.005–1.030)
pH: 7 (ref 5.0–8.0)

## 2021-12-06 NOTE — Discharge Instructions (Addendum)
You have been given the contact information for a local urologist.  Please call to schedule a follow-up appointment with them for a voiding test within the next 3 to 5 days in order to have the catheter removed.  Further information regarding urinary catheters has been provided for you for you to review at your leisure.  I also recommend following up with your surgeon Dr. Johney Maine on Monday or Tuesday for reevaluation and postop evaluation.  Return to the ED for any new or worsening symptoms as discussed.

## 2021-12-06 NOTE — ED Triage Notes (Signed)
"  Unable to urinate, just dribble a little bit since Thursday, had hemorrhoid surgery on Wednesday the 6th" per pt

## 2021-12-06 NOTE — ED Provider Notes (Signed)
Steven Yates   CSN: [id number] Arrival date & time: 12/06/21  0756     History  Chief Complaint  Patient presents with   Urinary Retention    Steven Yates is a 81 y.o. male with history of GERD, CAD, essential HTN, hyperlipidemia, diabetes mellitus, and gout.  Presenting to the ED today due to urinary incontinence.  Recent surgery on 9/6//2023 due to hemorrhoids with Dr. Johney Maine of [facility]. Pt spoke with them yesterday, tried sitz bath and Valium with some success, however continues to have overall difficulty voiding.  Last full urination on Wednesday.  Has not been able to urinate more than "a few dribbles" since.  Was instructed to come to the ED to have a urinary catheter placed.  Denies fever, N/V/D, chills, chest pain, shortness of breath, stiff neck.  Denies testicular or penile pain.  No other complaints at this time.  S/p HEMORRHOID W/LIGATION & PEXY, ANORECTAL EUA 12/03/21 by Dr. Johney Maine  The history is provided by the patient and medical records.     Home Medications Prior to Admission medications   Medication Sig Start Date End Date Taking? Authorizing Provider  aspirin EC 81 MG tablet Take 81 mg by mouth daily. Swallow whole.    [provider]  atorvastatin (LIPITOR) 10 MG tablet TAKE 1 TABLET(10 MG) BY MOUTH DAILY 11/29/20   Belva Crome, MD  Faricimab-svoa (VABYSMO IZ) by Intravitreal route every 8 (eight) weeks. One injection in left eye every 8 weeks.    [provider]  glimepiride (AMARYL) 1 MG tablet Take 1 tablet by mouth every morning. 11/06/17   [provider]  losartan-hydrochlorothiazide (HYZAAR) 50-12.5 MG tablet Take 1 tablet by mouth daily. 07/25/21   Belva Crome, MD  metFORMIN (GLUCOPHAGE) 500 MG tablet Take 1,000 mg by mouth daily with breakfast.  05/21/15   [provider]  Multiple Vitamins-Minerals (ICAPS AREDS 2 PO) Take 1 capsule by mouth in the morning and at bedtime.     [provider]  pantoprazole (PROTONIX) 40 MG tablet Take 1 tablet (40 mg total) by mouth daily. 04/02/14   Martinique, Peter M, MD      Allergies    Lisinopril    Review of Systems   Review of Systems  Genitourinary:  Positive for difficulty urinating.    Physical Exam Updated Vital Signs BP (!) 163/75 (BP Location: Right Arm)   Pulse (!) 55   Temp 98.2 F (36.8 C) (Oral)   Resp 18   Ht '5\' 9"'$  (1.753 m)   Wt 93.4 kg   SpO2 99%   BMI 30.42 kg/m  Physical Exam Vitals and nursing Yates reviewed.  Constitutional:      General: He is not in acute distress.    Appearance: He is well-developed. He is not ill-appearing, toxic-appearing or diaphoretic.  HENT:     Head: Normocephalic and atraumatic.     Mouth/Throat:     Mouth: Mucous membranes are moist.     Pharynx: Oropharynx is clear.  Eyes:     Conjunctiva/sclera: Conjunctivae normal.  Cardiovascular:     Rate and Rhythm: Normal rate and regular rhythm.     Pulses: Normal pulses.     Heart sounds: No murmur heard. Pulmonary:     Effort: Pulmonary effort is normal. No respiratory distress.     Breath sounds: Normal breath sounds.  Abdominal:     Palpations: Abdomen is soft.     Tenderness: There is  abdominal tenderness (mild, subjective pressure).  Musculoskeletal:        General: No swelling.     Cervical back: Neck supple. No rigidity.  Skin:    General: Skin is warm and dry.     Capillary Refill: Capillary refill takes less than 2 seconds.  Neurological:     Mental Status: He is alert and oriented to person, place, and time.  Psychiatric:        Mood and Affect: Mood normal.     ED Results / Procedures / Treatments   Labs (all labs ordered are listed, but only abnormal results are displayed) Labs Reviewed  URINALYSIS, ROUTINE W REFLEX MICROSCOPIC - Abnormal; Notable for the following components:      Result Value   Protein, ur 30 (*)    Bacteria, UA RARE (*)    All other components within normal  limits    EKG None  Radiology No results found.  Procedures Procedures    Medications Ordered in ED Medications - No data to display  ED Course/ Medical Decision Making/ A&P                           Medical Decision Making Amount and/or Complexity of Data Reviewed Labs: ordered.   81 y.o. male presents to the ED for concern of Urinary Retention   This involves an extensive number of treatment options, and is a complaint that carries with it a high risk of complications and morbidity.    Past Medical History / Co-morbidities / Social History: Hx of GERD, CAD, essential HTN, hyperlipidemia, diabetes mellitus, and gout S/p HEMORRHOID W/LIGATION & PEXY, ANORECTAL EUA 12/03/21 by Dr. Johney Maine Social Determinants of Health include: Elderly  Additional History:  Obtained by chart review.  [facility] telephone call 12/05/2021: 1:09pm "Spoke w/ pt to make him aware of SG's message below. He verbalized understanding. Pt was instructed to go ahead and take a dose of the diazepam now as well as taking a sitz bath to see if that helps w/ urination. Pt informed to call back in 1hr and give clinical update." 2:44pm "Called pt to f/u on urinary retention. Pt stated he did finally void...  After discussing w/ Puja, PA I informed pt if he starts having urinary retention again to take another valium. Informed him if that doesn't help then he will need to go to the ED to have a catheter inserted. Pt verbalized understanding "  Lab Tests: I ordered, and personally interpreted labs.  The pertinent results include:   UA:   Imaging Studies: Bladder scan: 498 mL fluid in bladder  ED Course: Pt well-appearing on exam.  Presenting today with urinary retention following a recent hemorrhoidectomy.  Nontoxic, nonseptic appearing in NAD.  Afebrile.  Hemodynamically stable.  No other medical complaints.  Was instructed by surgical group to come to the ED for urinary  catheter placement.  Bladder scan performed, 498 mL observed in the bladder.  Urinary catheter placed, adequate urine flow appreciated.  Urine sample sent for evaluation.  Physical exam unremarkable other than mild abdominal tenderness with subjective pressure that has since relieved post catheter placement. UA wwithout evidence of infection.  Antibiotics is not indicated.  Recommend close follow-up with urology for voiding evaluation, and close follow-up with surgical group for postoperative evaluation.  Patient reports satisfaction with today's encounter.  Patient in NAD and in good condition at time of discharge.  Disposition: After  consideration the patient's encounter today, I do not feel today's workup suggests an emergent condition requiring admission or immediate intervention beyond what has been performed at this time.  Safe for discharge; instructed to return immediately for worsening symptoms, change in symptoms or any other concerns.  I have reviewed the patients home medicines and have made adjustments as needed.  Discussed course of treatment with the patient, whom demonstrated understanding.  Patient in agreement and has no further questions.    I discussed this case with my attending physician Dr. Billy Fischer, who agreed with the proposed treatment course and cosigned this Yates including patient's presenting symptoms, physical exam, and planned diagnostics and interventions.  Attending physician stated agreement with plan or made changes to plan which were implemented.     This chart was dictated using voice recognition software.  Despite best efforts to proofread, errors can occur which can change the documentation meaning.         Final Clinical Impression(s) / ED Diagnoses Final diagnoses:  Urinary retention  S/P hemorrhoidectomy    Rx / DC Orders ED Discharge Orders     None         Prince Rome, Hershal Coria 74/73/40 1019    Gareth Morgan, MD 12/07/21  2239

## 2021-12-10 ENCOUNTER — Telehealth: Payer: Self-pay

## 2021-12-10 NOTE — Telephone Encounter (Signed)
Transition Care Management Follow-up Telephone Call Date of discharge and from where: 12/06/21 Steven Yates How have you been since you were released from the hospital? Not doing good. I just had surgery on hemorrhoids, and I had to have a catheter placed, so I am ill/not happy Any questions or concerns? No  Items Reviewed: Did the pt receive and understand the discharge instructions provided? Yes  Medications obtained and verified? Yes  Other?  N/a Any new allergies since your discharge? No  Dietary orders reviewed? Yes Do you have support at home? Yes   Home Care and Equipment/Supplies: Were home health services ordered? yes If so, what is the name of the agency? Unknown  Has the agency set up a time to come to the patient's home? Patient not interested Were any new equipment or medical supplies ordered?  Yes: indwelling catheter What is the name of the medical supply agency? Unknown Were you able to get the supplies/equipment? yes Do you have any questions related to the use of the equipment or supplies? No  Functional Questionnaire: (I = Independent and D = Dependent) ADLs: I  Bathing/Dressing- I  Meal Prep- I  Eating- I  Maintaining continence- I  Transferring/Ambulation- I  Managing Meds- I  Follow up appointments reviewed:  PCP Hospital f/u appt confirmed?  Patient wants to call back to schedule  Scheduled to see n/a on n/a @ n/a. [facility] f/u appt confirmed?  N/a  Scheduled to see n/a on n/a @ n/a. Are transportation arrangements needed? No  If their condition worsens, is the pt aware to call PCP or go to the Emergency Dept.? Yes Was the patient provided with contact information for the PCP's office or ED? Yes Was to pt encouraged to call back with questions or concerns? Yes   Angeline Slim, RN, BSN

## 2021-12-22 ENCOUNTER — Other Ambulatory Visit: Payer: Self-pay

## 2021-12-22 DIAGNOSIS — E7800 Pure hypercholesterolemia, unspecified: Secondary | ICD-10-CM

## 2021-12-22 MED ORDER — ATORVASTATIN CALCIUM 10 MG PO TABS: ORAL_TABLET | ORAL | 3 refills | Status: DC

## 2022-01-15 ENCOUNTER — Telehealth: Payer: Self-pay | Admitting: Family Medicine

## 2022-01-15 NOTE — Telephone Encounter (Signed)
Caller Name: Steven Yates Call back phone #: [phone]  Reason for Call: Pt is hoping to get lab work done in the morning for his OV tomorrow, can this be done?

## 2022-01-15 NOTE — Telephone Encounter (Signed)
Patient aware that labs are preferred at time of visit just in case something os needed to be added.

## 2022-01-16 ENCOUNTER — Encounter: Payer: Self-pay | Admitting: Family Medicine

## 2022-01-16 ENCOUNTER — Ambulatory Visit (INDEPENDENT_AMBULATORY_CARE_PROVIDER_SITE_OTHER): Payer: PPO | Admitting: Family Medicine

## 2022-01-16 VITALS — BP 150/68 | HR 60 | Temp 98.0°F | Ht 69.0 in | Wt 209.4 lb

## 2022-01-16 DIAGNOSIS — E782 Mixed hyperlipidemia: Secondary | ICD-10-CM | POA: Diagnosis not present

## 2022-01-16 DIAGNOSIS — I25119 Atherosclerotic heart disease of native coronary artery with unspecified angina pectoris: Secondary | ICD-10-CM

## 2022-01-16 DIAGNOSIS — E1169 Type 2 diabetes mellitus with other specified complication: Secondary | ICD-10-CM

## 2022-01-16 DIAGNOSIS — I10 Essential (primary) hypertension: Secondary | ICD-10-CM

## 2022-01-16 DIAGNOSIS — Z23 Encounter for immunization: Secondary | ICD-10-CM

## 2022-01-16 MED ORDER — LOSARTAN POTASSIUM-HCTZ 100-12.5 MG PO TABS: 1.0000 | ORAL_TABLET | Freq: Every day | ORAL | 1 refills | Status: DC

## 2022-01-16 NOTE — Progress Notes (Signed)
Established Patient Office Visit  Subjective   Patient ID: Steven Yates, male    DOB: July 16, 1940  Age: 81 y.o. MRN: [id number]  Chief Complaint  Patient presents with   Follow-up    6 month follow for HTN pt not fasting    HPI follow-up of hypertension, hyperlipidemia associated with coronary artery disease and type 2 diabetes.  Last hemoglobin A1c was 6.6 and controlled with Amaryl 1 mg, metformin 500 mg.  Continues atorvastatin low-dose 10 mg.  Taking Hyzaar 50/12.5 for his blood pressure.  He is doing well with all of these medications.  He is currently seeing urology for follow-up of BPH.  Recently started tamsulosin which has not improved his urine flow.  He has no regular exercise other than working in the yard.  He has no need for dental care.  He is edentulous.  New onset of left knee pain.  He is status post replacement in this knee some years ago.    Review of Systems  Constitutional: Negative.   HENT: Negative.    Eyes:  Negative for blurred vision, discharge and redness.  Respiratory: Negative.    Cardiovascular: Negative.   Gastrointestinal:  Negative for abdominal pain.  Genitourinary: Negative.   Musculoskeletal:  Positive for joint pain. Negative for myalgias.  Skin:  Negative for rash.  Neurological:  Negative for tingling, loss of consciousness and weakness.  Endo/Heme/Allergies:  Negative for polydipsia.      Objective:     BP (!) 150/68 (BP Location: Right Arm)   Pulse 60   Temp 98 F (36.7 C) (Temporal)   Ht '5\' 9"'$  (1.753 m)   Wt 209 lb 6.4 oz (95 kg)   SpO2 96%   BMI 30.92 kg/m  BP Readings from Last 3 Encounters:  01/16/22 (!) 150/68  12/06/21 (!) 163/75  11/11/21 (!) 140/82   Wt Readings from Last 3 Encounters:  01/16/22 209 lb 6.4 oz (95 kg)  12/06/21 206 lb (93.4 kg)  11/11/21 210 lb (95.3 kg)      Physical Exam Constitutional:      General: He is not in acute distress.    Appearance: Normal appearance. He is not ill-appearing,  toxic-appearing or diaphoretic.  HENT:     Head: Normocephalic and atraumatic.     Right Ear: External ear normal.     Left Ear: External ear normal.     Mouth/Throat:     Mouth: Mucous membranes are moist.     Pharynx: Oropharynx is clear. No oropharyngeal exudate or posterior oropharyngeal erythema.  Eyes:     General: No scleral icterus.       Right eye: No discharge.        Left eye: No discharge.     Extraocular Movements: Extraocular movements intact.     Conjunctiva/sclera: Conjunctivae normal.     Pupils: Pupils are equal, round, and reactive to light.  Cardiovascular:     Rate and Rhythm: Normal rate and regular rhythm.  Pulmonary:     Effort: Pulmonary effort is normal. No respiratory distress.     Breath sounds: Normal breath sounds.  Abdominal:     General: Bowel sounds are normal.     Tenderness: There is no abdominal tenderness. There is no guarding.  Musculoskeletal:     Cervical back: No rigidity or tenderness.  Skin:    General: Skin is warm and dry.  Neurological:     Mental Status: He is alert and oriented to person, place, and time.  Psychiatric:        Mood and Affect: Mood normal.        Behavior: Behavior normal.      No results found for any visits on 01/16/22.    The ASCVD Risk score (Arnett DK, et al., 2019) failed to calculate for the following reasons:   Cannot find a previous HDL lab   Cannot find a previous total cholesterol lab    Assessment & Plan:   Problem List Items Addressed This Visit       Cardiovascular and Mediastinum   Coronary artery disease involving native coronary artery of native heart with angina pectoris ([place])   Relevant Medications   losartan-hydrochlorothiazide (HYZAAR) 100-12.5 MG tablet   Essential hypertension - Primary   Relevant Medications   losartan-hydrochlorothiazide (HYZAAR) 100-12.5 MG tablet   Other Relevant Orders   CBC   Comprehensive metabolic panel   Urinalysis, Routine w reflex microscopic    Microalbumin / creatinine urine ratio     Endocrine   Diabetes mellitus ([place])   Relevant Medications   losartan-hydrochlorothiazide (HYZAAR) 100-12.5 MG tablet   Other Relevant Orders   Comprehensive metabolic panel   Hemoglobin A1c   Urinalysis, Routine w reflex microscopic   Microalbumin / creatinine urine ratio     Other   Mixed hyperlipidemia   Relevant Medications   losartan-hydrochlorothiazide (HYZAAR) 100-12.5 MG tablet   Other Relevant Orders   Lipid panel   Need for influenza vaccination   Relevant Orders   Flu vaccine HIGH DOSE PF (Fluzone High dose)    Return in about 8 weeks (around 03/13/2022), or RTC on Wednesday for fasting lab work..  Blood pressure not well controlled.  Have increased Hyzaar to 100/12.5.  Patient will check and record his blood pressures over the next 2 to 3 months.  Continue current doses of other medications.  Adjustments made pending results of labs.  We will follow-up with orthopedics for his left knee pain.  Libby Maw, MD

## 2022-01-20 ENCOUNTER — Telehealth: Payer: Self-pay | Admitting: Interventional Cardiology

## 2022-01-20 NOTE — Telephone Encounter (Signed)
Returned call to patient.  Patient states Dr. Ethelene Hal increased Hyzaar to 100-12.'5mg'$  QD at [facility] on 01/16/22.  Patient reports since this increase he has experienced nausea and weakness, feeling "blah". He wants to know what Dr. Tamala Julian thinks about this increase.  Informed patient that Dr. Tamala Julian is out of the office for the next 2 weeks and he should discuss this with Dr. Ethelene Hal.  Advised patient to check BP at home over the next week, 2 hours after taking medication, and send a list of readings to [place] via MyChart. Also to report symptoms to Dr. Bebe Shaggy office.  Patient verbalized understanding.

## 2022-01-20 NOTE — Telephone Encounter (Signed)
Pt c/o medication issue:  1. Name of Medication: Losartan 50 mg  2. How are you currently taking this medication (dosage and times per day)? 1 time a day  3. Are you having a reaction (difficulty breathing--STAT)?   4. What is your medication issue? Primary doctor changed it  to 100 mg on 01-15-22. He said he started feeling bad, real nauseated and weak. He wants to know what does Dr Tamala Julian think about the new change of the medicine?

## 2022-01-21 ENCOUNTER — Other Ambulatory Visit: Payer: Self-pay | Admitting: Interventional Cardiology

## 2022-01-21 ENCOUNTER — Other Ambulatory Visit (INDEPENDENT_AMBULATORY_CARE_PROVIDER_SITE_OTHER): Payer: PPO

## 2022-01-21 DIAGNOSIS — E782 Mixed hyperlipidemia: Secondary | ICD-10-CM | POA: Diagnosis not present

## 2022-01-21 DIAGNOSIS — I10 Essential (primary) hypertension: Secondary | ICD-10-CM

## 2022-01-21 DIAGNOSIS — E1169 Type 2 diabetes mellitus with other specified complication: Secondary | ICD-10-CM

## 2022-01-21 LAB — CBC
HCT: 36.7 % — ABNORMAL LOW (ref 39.0–52.0)
Hemoglobin: 12.7 g/dL — ABNORMAL LOW (ref 13.0–17.0)
MCHC: 34.7 g/dL (ref 30.0–36.0)
MCV: 98.7 fl (ref 78.0–100.0)
Platelets: 172 10*3/uL (ref 150.0–400.0)
RBC: 3.71 Mil/uL — ABNORMAL LOW (ref 4.22–5.81)
RDW: 15.9 % — ABNORMAL HIGH (ref 11.5–15.5)
WBC: 8.5 10*3/uL (ref 4.0–10.5)

## 2022-01-21 LAB — MICROALBUMIN / CREATININE URINE RATIO
Creatinine,U: 203 mg/dL
Microalb Creat Ratio: 0.6 mg/g (ref 0.0–30.0)
Microalb, Ur: 1.2 mg/dL (ref 0.0–1.9)

## 2022-01-21 LAB — COMPREHENSIVE METABOLIC PANEL
ALT: 22 U/L (ref 0–53)
AST: 19 U/L (ref 0–37)
Albumin: 4.6 g/dL (ref 3.5–5.2)
Alkaline Phosphatase: 49 U/L (ref 39–117)
BUN: 15 mg/dL (ref 6–23)
CO2: 27 mEq/L (ref 19–32)
Calcium: 9.5 mg/dL (ref 8.4–10.5)
Chloride: 96 mEq/L (ref 96–112)
Creatinine, Ser: 0.84 mg/dL (ref 0.40–1.50)
GFR: 82.95 mL/min (ref >60.00)
Glucose, Bld: 139 mg/dL — ABNORMAL HIGH (ref 70–99)
Potassium: 4 mEq/L (ref 3.5–5.1)
Sodium: 132 mEq/L — ABNORMAL LOW (ref 135–145)
Total Bilirubin: 0.9 mg/dL (ref 0.2–1.2)
Total Protein: 7.1 g/dL (ref 6.0–8.3)

## 2022-01-21 LAB — URINALYSIS, ROUTINE W REFLEX MICROSCOPIC
Bilirubin Urine: NEGATIVE
Hgb urine dipstick: NEGATIVE
Ketones, ur: NEGATIVE
Leukocytes,Ua: NEGATIVE
Nitrite: NEGATIVE
RBC / HPF: NONE SEEN (ref 0–?)
Specific Gravity, Urine: 1.015 (ref 1.000–1.030)
Total Protein, Urine: NEGATIVE
Urine Glucose: NEGATIVE
Urobilinogen, UA: 1 (ref 0.0–1.0)
pH: 7 (ref 5.0–8.0)

## 2022-01-21 LAB — LIPID PANEL
Cholesterol: 104 mg/dL (ref 0–200)
HDL: 34.7 mg/dL — ABNORMAL LOW (ref >39.00)
LDL Cholesterol: 46 mg/dL (ref 0–99)
NonHDL: 68.81
Total CHOL/HDL Ratio: 3
Triglycerides: 116 mg/dL (ref 0.0–149.0)
VLDL: 23.2 mg/dL (ref 0.0–40.0)

## 2022-01-21 LAB — HEMOGLOBIN A1C: Hgb A1c MFr Bld: 5.8 % (ref 4.6–6.5)

## 2022-02-09 ENCOUNTER — Encounter: Payer: Self-pay | Admitting: Family Medicine

## 2022-02-26 ENCOUNTER — Ambulatory Visit (INDEPENDENT_AMBULATORY_CARE_PROVIDER_SITE_OTHER): Payer: PPO | Admitting: Family Medicine

## 2022-02-26 ENCOUNTER — Encounter: Payer: Self-pay | Admitting: Family Medicine

## 2022-02-26 VITALS — BP 146/88 | HR 76 | Temp 97.8°F | Wt 211.6 lb

## 2022-02-26 DIAGNOSIS — R059 Cough, unspecified: Secondary | ICD-10-CM

## 2022-02-26 DIAGNOSIS — I10 Essential (primary) hypertension: Secondary | ICD-10-CM | POA: Diagnosis not present

## 2022-02-26 LAB — POCT INFLUENZA A/B
Influenza A, POC: NEGATIVE
Influenza B, POC: NEGATIVE

## 2022-02-26 LAB — POC COVID19 BINAXNOW: SARS Coronavirus 2 Ag: NEGATIVE

## 2022-02-26 MED ORDER — AMLODIPINE BESYLATE 2.5 MG PO TABS: 2.5000 mg | ORAL_TABLET | Freq: Every day | ORAL | 0 refills | Status: DC

## 2022-02-26 NOTE — Patient Instructions (Signed)
For viral infection, use tessalon Perles, tylenol, robitussin, and flonase as needed.  For blood pressure, continue losartan/hydrochlorothizazide. We are adding amlodipine 2.5 mg a day.  Check blood pressure at home. If it goes above 160/110 and you are tolerating amlodipine well, increase amlodipine to two pills (5 mg) a day.

## 2022-02-26 NOTE — Progress Notes (Signed)
Assessment/Plan:   Problem List Items Addressed This Visit       Cardiovascular and Mediastinum   Essential hypertension   Relevant Medications   amLODipine (NORVASC) 2.5 MG tablet   Other Visit Diagnoses     Cough, unspecified type    -  Primary   Relevant Orders   POCT Influenza A/B (Completed)   POC COVID-19 (Completed)      Cough likely from viral infection from exposure Recommend trial of OTC supportive care, offered prescription for Tessalon Perles, however patient states that he has these at home and will try these  Hypertension.  Although repeat blood pressure was normal in the office, does have elevated blood pressures at home.  Patient with history of diabetes and heart disease, and would like to maintain close control of blood pressure for patient to avoid negative sequelae.  Trial low-dose amlodipine and recommend patient follow-up for blood pressure recheck in a few days.  ED and return precautions discussed    Subjective:  HPI:  Steven Yates is a 81 y.o. male who has Left anterior fascicular hemiblock; Dyspnea on exertion; Gastroesophageal reflux; Abnormal nuclear stress test; Coronary artery disease involving native coronary artery of native heart with angina pectoris ([place]); Plantar fasciitis, bilateral; Metatarsal deformity; Groin hematoma; Essential hypertension; Mixed hyperlipidemia; OA (osteoarthritis) of knee; Diabetes mellitus ([place]); and Need for influenza vaccination on their problem list..   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  Cough, chest congestion x 1 day. ) .   Viral URI with hypertension.  Patient reports 1 day of cough and chest congestion.  His  cough is nonproductive.  It does cause some irritation in the back of his throat. His wife has had a recent respiratory illness.  Patient has medications for his symptoms. He denies any chest pain or shortness of breath.    Hypertension.  Patient reports that since he has been ill, he is at significant the elevated blood pressures at home around 170s over 80s.  Of note patient was seen by PCP on 01/16/2022 and had losartan increased to 100 mg in the combination pill with hydrochlorothiazide at 12.5 mg.  Past Surgical History:  Procedure Laterality Date   BACK SURGERY     x2   CARDIAC CATHETERIZATION  01/2014   Dr. Tamala Julian   CARDIAC CATHETERIZATION  04/04/2014   Procedure: CORONARY/BYPASS GRAFT CTO INTERVENTION;  Surgeon: Peter M Martinique, MD;  Location: [facility] CATH LAB;  Service: Cardiovascular;;   CARPAL TUNNEL RELEASE     bil   2019   CHOLECYSTECTOMY  2010   CORONARY ANGIOPLASTY WITH STENT PLACEMENT  04/04/2014   "2"   INGUINAL HERNIA REPAIR Bilateral 1966?   INTRAVASCULAR PRESSURE WIRE/FFR STUDY N/A 09/01/2016   Procedure: Intravascular Pressure Wire/FFR Study;  Surgeon: Belva Crome, MD;  Location: [place] CV LAB;  Service: Cardiovascular;  Laterality: N/A;   LEFT HEART CATH AND CORONARY ANGIOGRAPHY N/A 09/01/2016   Procedure: Left Heart Cath and Coronary Angiography;  Surgeon: Belva Crome, MD;  Location: [place] CV LAB;  Service: Cardiovascular;  Laterality: N/A;   LEFT HEART CATHETERIZATION WITH CORONARY ANGIOGRAM N/A 01/30/2014   Procedure: LEFT HEART CATHETERIZATION WITH CORONARY ANGIOGRAM;  Surgeon: Sinclair Grooms, MD;  Location: [facility] CATH LAB;  Service: Cardiovascular;  Laterality: N/A;   LUMBAR DISC SURGERY  1980's X 2   "ruptured disc"   PERCUTANEOUS CORONARY STENT INTERVENTION (PCI-S)  01/30/2014   Procedure: PERCUTANEOUS CORONARY STENT INTERVENTION (PCI-S);  Surgeon: Sinclair Grooms, MD;  Location: [facility] CATH LAB;  Service: Cardiovascular;;   SHOULDER OPEN ROTATOR CUFF REPAIR  Right 1990's   TOTAL KNEE ARTHROPLASTY Left 06/06/2018   Procedure: TOTAL KNEE ARTHROPLASTY;  Surgeon: Gaynelle Arabian, MD;  Location: [facility] ORS;  Service: Orthopedics;  Laterality: Left;  5mn   ULTRASOUND GUIDANCE FOR VASCULAR ACCESS  09/01/2016   Procedure: Ultrasound Guidance For Vascular Access;  Surgeon: SBelva Crome MD;  [facility];  Service: Cardiovascular;;    Outpatient Medications Prior to Visit  Medication Sig Dispense Refill   aspirin EC 81 MG tablet Take 81 mg by mouth daily. Swallow whole.     atorvastatin (LIPITOR) 10 MG tablet TAKE 1 TABLET(10 MG) BY MOUTH DAILY 90 tablet 3   Faricimab-svoa (VABYSMO IZ) by Intravitreal route every 8 (eight) weeks. One injection in left eye every 8 weeks.     glimepiride (AMARYL) 1 MG tablet Take 1 tablet by mouth every morning.  11   losartan-hydrochlorothiazide (HYZAAR) 100-12.5 MG tablet Take 1 tablet by mouth daily. 90 tablet 1   metFORMIN (GLUCOPHAGE) 500 MG tablet Take 1,000 mg by mouth daily with breakfast.      Multiple Vitamins-Minerals (ICAPS AREDS 2 PO) Take 1 capsule by mouth in the morning and at bedtime.     pantoprazole (PROTONIX) 40 MG tablet Take 1 tablet (40 mg total) by mouth daily. 30 tablet 11   tamsulosin (FLOMAX) 0.4 MG CAPS capsule Take 0.4 mg by mouth.     No facility-administered medications prior to visit.    Family History  Problem Relation Age of Onset   Emphysema Father    Heart disease Mother    Heart disease Brother    Cancer Brother    Other Sister        GI DISEASE    Social History   Socioeconomic History   Marital status: Married    Spouse name: Not on file   Number of children: 2   Years of education: Not on file   Highest education level: Not on file  Occupational History   Occupation: [employer]  Tobacco Use   Smoking status: Former    Types: Cigars    Quit date: 04/29/2018    Years since quitting: 3.8   Smokeless tobacco: Never  Vaping Use   Vaping Use:  Never used  Substance and Sexual Activity   Alcohol use: No    Alcohol/week: 0.0 standard drinks of alcohol   Drug use: No   Sexual activity: Yes  Other Topics Concern   Not on file  Social History Narrative   Not on file   Social Determinants of Health   Financial Resource Strain: Not on file  Food Insecurity: Not on file  Transportation Needs: Not on file  Physical Activity: Not on file  Stress: Not on file  Social Connections: Not on file  Intimate Partner Violence: Not on file  Objective:  Physical Exam: BP (!) 146/88 (BP Location: Right Arm, Patient Position: Sitting, Cuff Size: Large)   Pulse 76   Temp 97.8 F (36.6 C) (Temporal)   Wt 211 lb 9.6 oz (96 kg)   SpO2 97%   BMI 31.25 kg/m    General: No acute distress. Awake and conversant.  Eyes: Normal conjunctiva, anicteric. Round symmetric pupils.  ENT: Chronic bilateral hearing decreased, but improved with hearing aids, bilateral tympanic membranes normal, clear otic canals no nasal discharge, no oropharyngeal lesions Neck: Neck is supple. No masses or thyromegaly.  Respiratory: Respirations are non-labored.  Clear to auscultation bilaterally Skin: Warm. No rashes or ulcers.  Psych: Alert and oriented. Cooperative, Appropriate mood and affect, Normal judgment.  CV: No cyanosis or JVD, RRR, no MRG MSK: Normal ambulation. No clubbing  Neuro: Sensation and CN II-XII grossly normal.   Results for orders placed or performed in visit on 02/26/22  POCT Influenza A/B  Result Value Ref Range   Influenza A, POC Negative Negative   Influenza B, POC Negative Negative  POC COVID-19  Result Value Ref Range   SARS Coronavirus 2 Ag Negative Negative         Alesia Banda, MD, MS

## 2022-03-02 ENCOUNTER — Encounter: Payer: Self-pay | Admitting: Family Medicine

## 2022-03-02 ENCOUNTER — Ambulatory Visit (INDEPENDENT_AMBULATORY_CARE_PROVIDER_SITE_OTHER): Payer: PPO | Admitting: Family Medicine

## 2022-03-02 VITALS — BP 150/78 | HR 56 | Temp 97.8°F | Ht 69.0 in | Wt 211.0 lb

## 2022-03-02 DIAGNOSIS — J069 Acute upper respiratory infection, unspecified: Secondary | ICD-10-CM | POA: Diagnosis not present

## 2022-03-02 DIAGNOSIS — I10 Essential (primary) hypertension: Secondary | ICD-10-CM | POA: Diagnosis not present

## 2022-03-02 MED ORDER — AMLODIPINE BESYLATE 5 MG PO TABS: 5.0000 mg | ORAL_TABLET | Freq: Every day | ORAL | 1 refills | Status: DC

## 2022-03-02 MED ORDER — BENZONATATE 200 MG PO CAPS: 200.0000 mg | ORAL_CAPSULE | Freq: Two times a day (BID) | ORAL | 0 refills | Status: DC | PRN

## 2022-03-02 NOTE — Progress Notes (Signed)
Established Patient Office Visit   Subjective:  Patient ID: Steven Yates. Eyer, male    DOB: December 05, 1940  Age: 81 y.o. MRN: [id number]  Chief Complaint  Patient presents with   Follow-up    4 day f/u BP.      HPI Encounter Diagnoses  Name Primary?   Essential hypertension Yes   Viral upper respiratory tract infection    For follow-up of URI and blood pressure.  Continues with Hyzaar 100/12.5.  Amlodipine 2.5 recently added.  Tolerating both medications well.  Seems to be recovering from cold.  There is a lingering cough.  There is no tightness or phlegm production.   Review of Systems  Constitutional: Negative.   HENT: Negative.    Eyes:  Negative for blurred vision, discharge and redness.  Respiratory:  Positive for cough. Negative for sputum production, shortness of breath and wheezing.   Cardiovascular: Negative.   Gastrointestinal:  Negative for abdominal pain.  Genitourinary: Negative.   Musculoskeletal: Negative.  Negative for myalgias.  Skin:  Negative for rash.  Neurological:  Negative for tingling, loss of consciousness, weakness and headaches.  Endo/Heme/Allergies:  Negative for polydipsia.     Current Outpatient Medications:    amLODipine (NORVASC) 5 MG tablet, Take 1 tablet (5 mg total) by mouth daily., Disp: 90 tablet, Rfl: 1   aspirin EC 81 MG tablet, Take 81 mg by mouth daily. Swallow whole., Disp: , Rfl:    atorvastatin (LIPITOR) 10 MG tablet, TAKE 1 TABLET(10 MG) BY MOUTH DAILY, Disp: 90 tablet, Rfl: 3   benzonatate (TESSALON) 200 MG capsule, Take 1 capsule (200 mg total) by mouth 2 (two) times daily as needed for cough., Disp: 20 capsule, Rfl: 0   Faricimab-svoa (VABYSMO IZ), by Intravitreal route every 8 (eight) weeks. One injection in left eye every 8 weeks., Disp: , Rfl:    glimepiride (AMARYL) 1 MG tablet, Take 1 tablet by mouth every morning., Disp: , Rfl: 11   losartan-hydrochlorothiazide (HYZAAR) 100-12.5 MG tablet, Take 1 tablet by mouth daily.,  Disp: 90 tablet, Rfl: 1   metFORMIN (GLUCOPHAGE) 500 MG tablet, Take 1,000 mg by mouth daily with breakfast. , Disp: , Rfl:    Multiple Vitamins-Minerals (ICAPS AREDS 2 PO), Take 1 capsule by mouth in the morning and at bedtime., Disp: , Rfl:    pantoprazole (PROTONIX) 40 MG tablet, Take 1 tablet (40 mg total) by mouth daily., Disp: 30 tablet, Rfl: 11   tamsulosin (FLOMAX) 0.4 MG CAPS capsule, Take 0.4 mg by mouth., Disp: , Rfl:    Objective:     BP (!) 150/78 (BP Location: Left Arm, Patient Position: Sitting, Cuff Size: Normal)   Pulse (!) 56   Temp 97.8 F (36.6 C) (Temporal)   Ht '5\' 9"'$  (1.753 m)   Wt 211 lb (95.7 kg)   SpO2 95%   BMI 31.16 kg/m  BP Readings from Last 3 Encounters:  03/02/22 (!) 150/78  02/26/22 (!) 146/88  01/16/22 (!) 150/68   Wt Readings from Last 3 Encounters:  03/02/22 211 lb (95.7 kg)  02/26/22 211 lb 9.6 oz (96 kg)  01/16/22 209 lb 6.4 oz (95 kg)      Physical Exam Constitutional:      General: He is not in acute distress.    Appearance: Normal appearance. He is not ill-appearing, toxic-appearing or diaphoretic.  HENT:     Head: Normocephalic and atraumatic.     Right Ear: External ear normal.     Left Ear: External ear  normal.  Eyes:     General: No scleral icterus.       Right eye: No discharge.        Left eye: No discharge.     Extraocular Movements: Extraocular movements intact.     Conjunctiva/sclera: Conjunctivae normal.  Cardiovascular:     Rate and Rhythm: Normal rate and regular rhythm.  Pulmonary:     Effort: Pulmonary effort is normal. No respiratory distress.     Breath sounds: Normal breath sounds. No wheezing or rales.  Abdominal:     General: Bowel sounds are normal.  Musculoskeletal:     Cervical back: No rigidity or tenderness.  Skin:    General: Skin is warm and dry.  Neurological:     Mental Status: He is alert and oriented to person, place, and time.  Psychiatric:        Mood and Affect: Mood normal.         Behavior: Behavior normal.      No results found for any visits on 03/02/22.    The ASCVD Risk score (Arnett DK, et al., 2019) failed to calculate for the following reasons:   The valid total cholesterol range is 130 to 320 mg/dL    Assessment & Plan:   Essential hypertension -     amLODIPine Besylate; Take 1 tablet (5 mg total) by mouth daily.  Dispense: 90 tablet; Refill: 1  Viral upper respiratory tract infection -     Benzonatate; Take 1 capsule (200 mg total) by mouth 2 (two) times daily as needed for cough.  Dispense: 20 capsule; Refill: 0    Return in about 7 weeks (around 04/20/2022).  Tessalon for lingering cough.  He will let me know if not improved by the end of the week.  Bump up amlodipine to 5 mg.  Follow-up towards end of January for recheck.  Advised that it takes this medication 4 to 6 weeks to become fully effective.  Information was given on managing hypertension.  Libby Maw, MD

## 2022-03-13 ENCOUNTER — Ambulatory Visit: Payer: PPO | Admitting: Family Medicine

## 2022-03-18 IMAGING — CT CT MAXILLOFACIAL W/ CM
1 series · 15 of 30 positions shown, 19 images · IV contrast (APPLIED)
Comparison: None.

CLINICAL DATA: Right facial lump

Creatinine was obtained on site at [HOSPITAL] at [HOSPITAL].
Results: Creatinine 0.8 mg/dL.
EXAM:
CT MAXILLOFACIAL WITH CONTRAST
TECHNIQUE: Multidetector CT imaging of the maxillofacial structures was
performed with intravenous contrast. Multiplanar CT image
reconstructions were also generated.
CONTRAST:  75mL H7EQX0-NNN IOPAMIDOL (H7EQX0-NNN) INJECTION 61%

[Series 4: maxofacial -soft · axial · 0.49mm/px · z∈[-208,-60]mm · 15 of 80 slices shown, 19 images]
[im 3/80  brain]
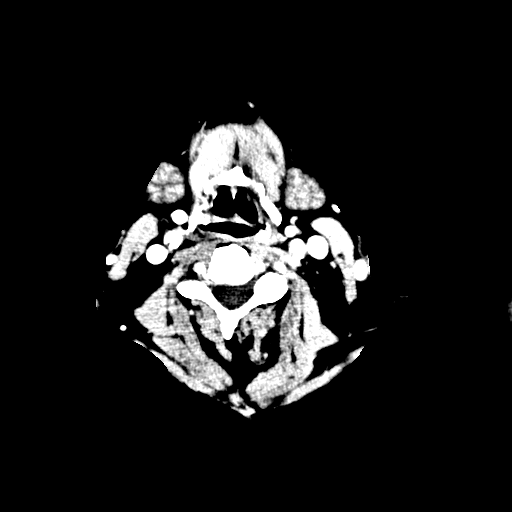
[im 3/80  bone]
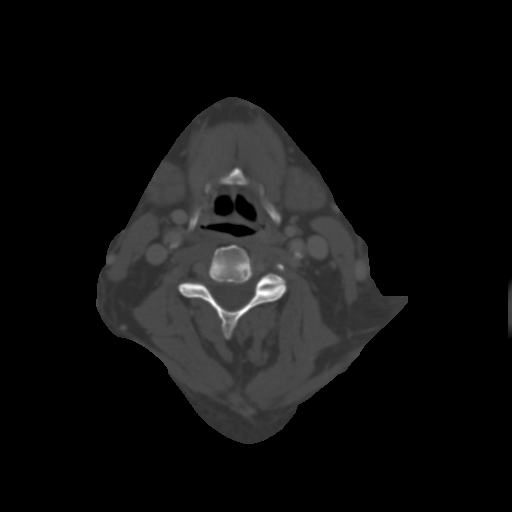
[im 9/80  bone]
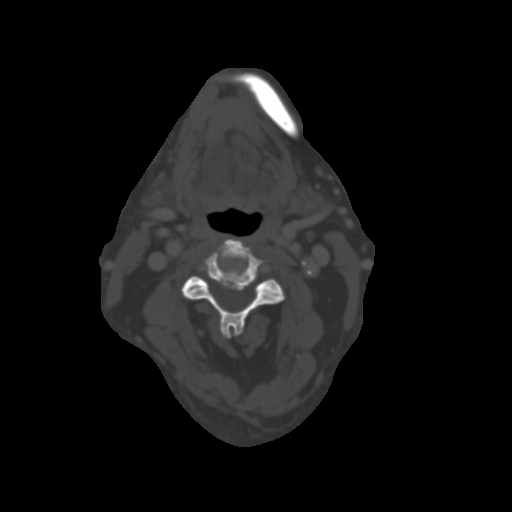
[im 14/80  bone]
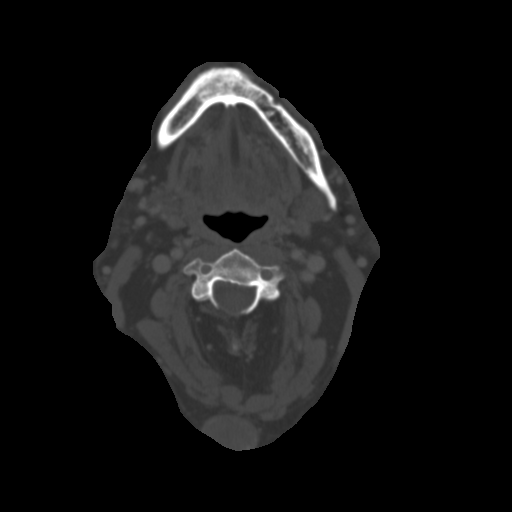
[im 20/80  bone]
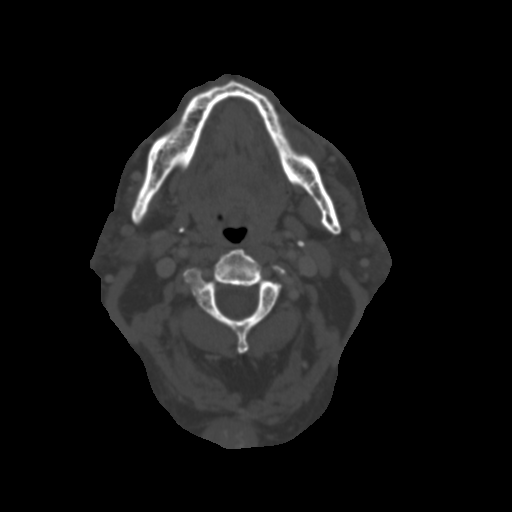
[im 25/80  brain]
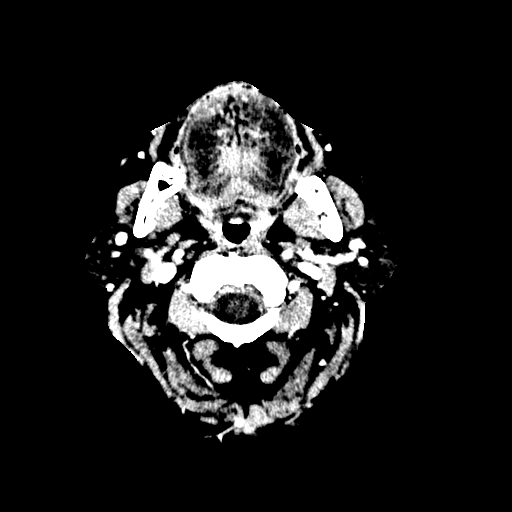
[im 25/80  bone]
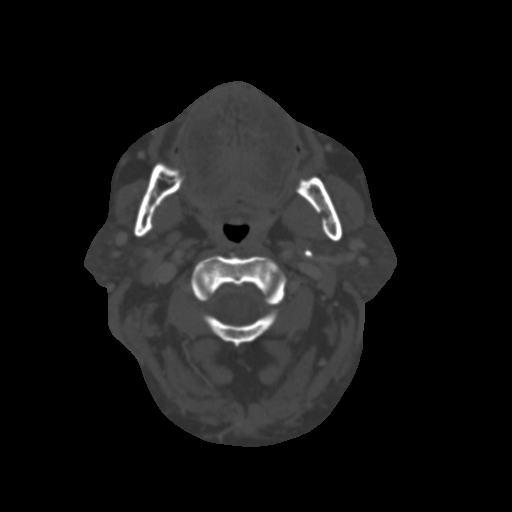
[im 30/80  bone]
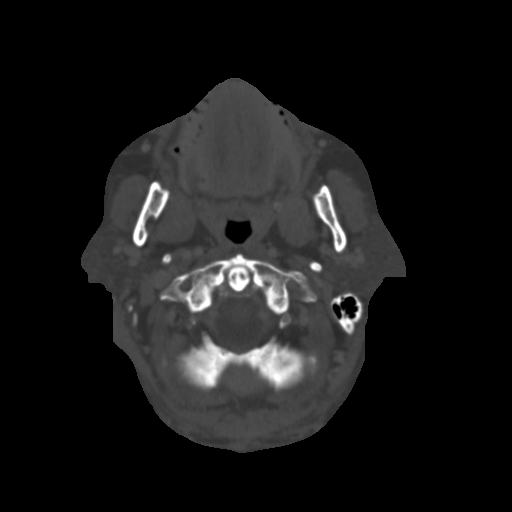
[im 36/80  bone]
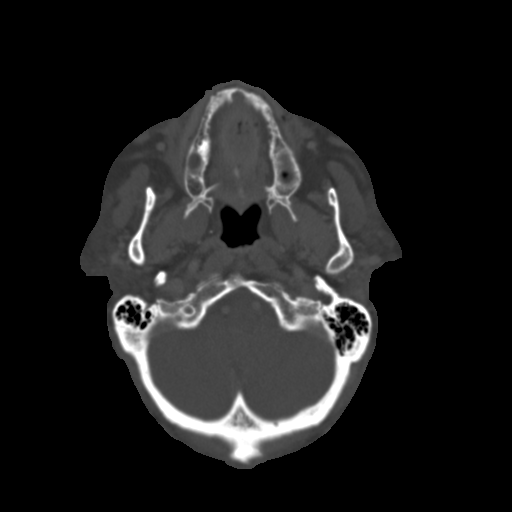
[im 41/80  bone]
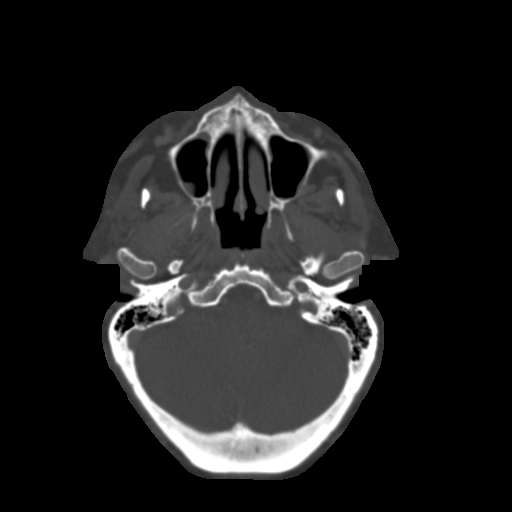
[im 44/80  brain]
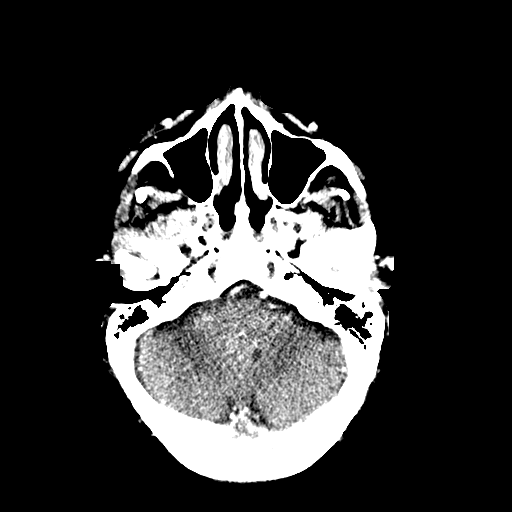
[im 44/80  bone]
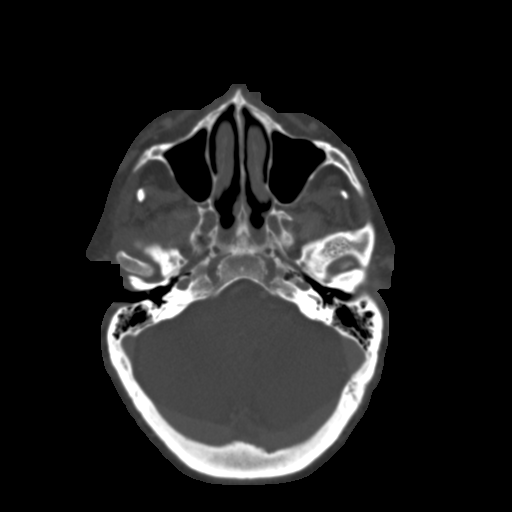
[im 50/80  bone]
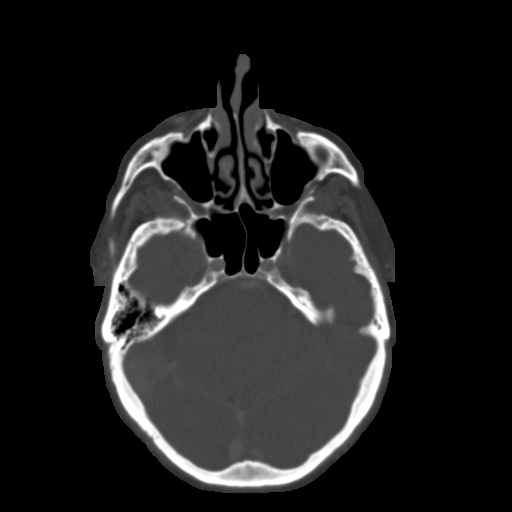
[im 55/80  bone]
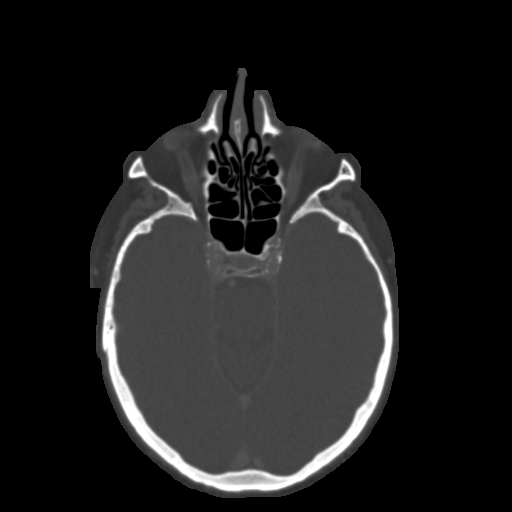
[im 60/80  bone]
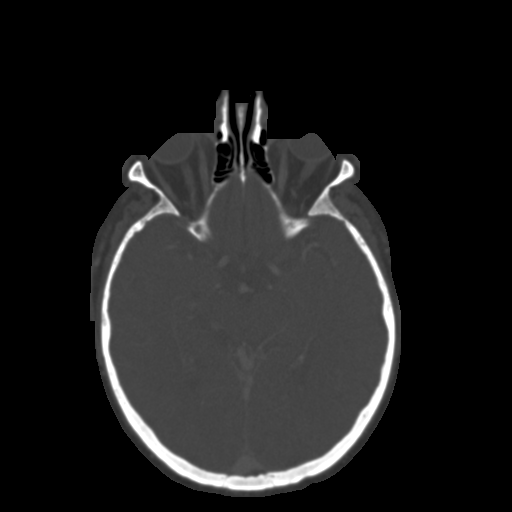
[im 66/80  brain]
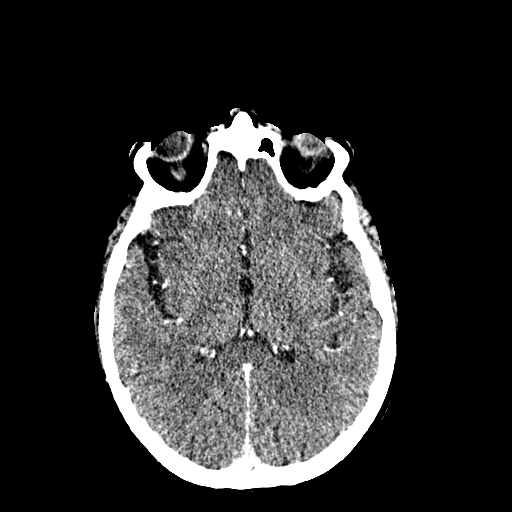
[im 66/80  bone]
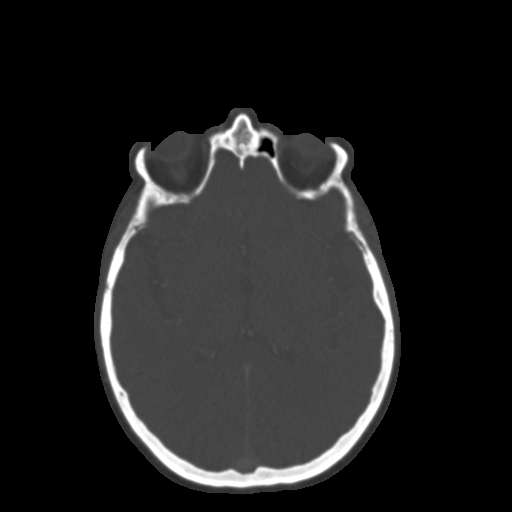
[im 71/80  bone]
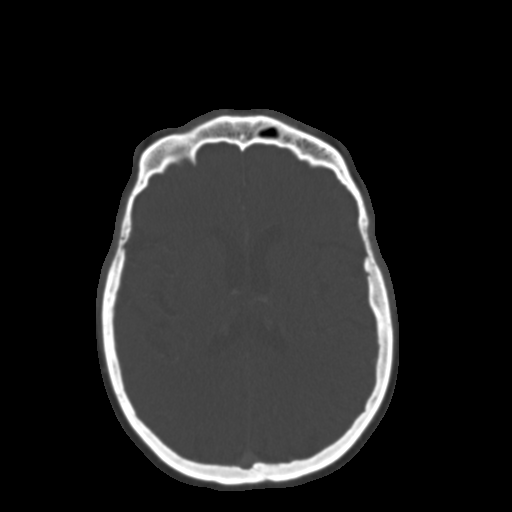
[im 77/80  bone]
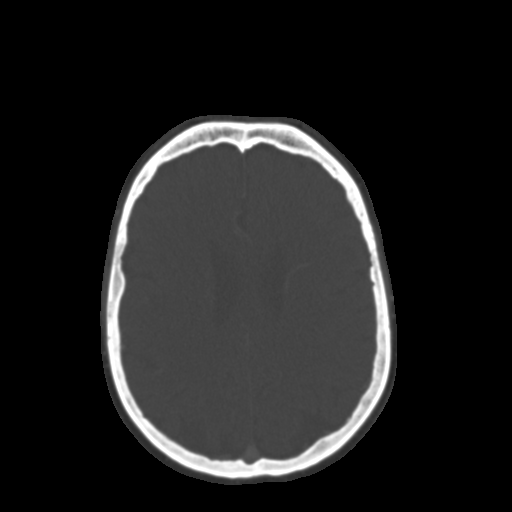

[15 of 30 positions shown; findings below may reference images not displayed]

FINDINGS: Osseous: No fracture or mandibular dislocation. No destructive
process.

Orbits: Negative. No traumatic or inflammatory finding.

Sinuses: Clear.

Soft tissues: There is a 10 mm centrally hypodense lymph node at the
deep aspect of the right parotid gland, in close proximity to the
marked site. No other enlarged or abnormal density lymph nodes. In
the posterior midline subcutaneous tissues there is an inclusion
cyst that measures 3 cm.

Limited intracranial: Normal
IMPRESSION: 1. A 10 mm centrally hypodense lymph node at the deep aspect of the
right parotid gland, in close proximity to the marked site. This is
concerning for metastatic disease. This may be from an unknown
head/neck primary, or from the reported previous skin cancer.
2. Posterior midline subcutaneous inclusion cyst.

## 2022-03-27 ENCOUNTER — Encounter: Payer: Self-pay | Admitting: Cardiology

## 2022-04-01 ENCOUNTER — Telehealth: Payer: Self-pay | Admitting: Cardiology

## 2022-04-01 NOTE — Telephone Encounter (Signed)
Dr. Martinique approved to become this patient's new provider from Dr. Tamala Julian at [facility]. (retiring) 04-01-2022 Noreene Larsson)

## 2022-04-02 ENCOUNTER — Telehealth: Payer: Self-pay | Admitting: Cardiology

## 2022-04-02 NOTE — Telephone Encounter (Signed)
Dr. Martinique approved to become this patient's new provider from Dr. Tamala Julian at [facility]. (retiring) 04-01-2022  (Patient wanted to be seen by an MD, not an APP)  Patient has c/o SOB for the past few months with activity. Also reports some sharp pains in his chest that come and go mainly at night- a couple times a week for the past month. Not currently having. Chest pain does not happen along with SOB or with activity.  He denies light-headed ness, no dizziness, no swelling, no back or arm pain, no nausea/vomiting, no sweating. He reports that his BP's have been ok since his meds were increased/changed in December. He does report some weight gain over the past few months, but no swelling in extremities or abdomen. Appt made for tomorrow 04/03/22 with Dr Gardiner Rhyme (DOD) at 1630 for SOB.

## 2022-04-02 NOTE — Telephone Encounter (Signed)
Pt c/o Shortness Of Breath: STAT if SOB developed within the last 24 hours or pt is noticeably SOB on the phone  1. Are you currently SOB (can you hear that pt is SOB on the phone)? no  2. How long have you been experiencing SOB? Last couple months  3. Are you SOB when sitting or when up moving around? Moving around  4. Are you currently experiencing any other symptoms? no

## 2022-04-02 NOTE — Progress Notes (Signed)
Cardiology Office Note:    Date:  04/03/2022   ID:  Steven Ginger. Yates, Buday 1940/09/09, MRN [id number]  PCP:  Steven Maw, MD  Cardiologist:  Steven Grooms, MD  Electrophysiologist:  None   Referring MD: Steven Yates,*   Chief Complaint  Patient presents with   Coronary Artery Disease    History of Present Illness:    Steven Yates. Benham is a 82 y.o. male with a hx of CAD status post CTO PCI to LAD with DES x 2 in 2016, hypertension, T2DM, hyperlipidemia who presents for follow-up.  He has followed with Dr. Tamala Yates, last seen 11/29/2020.  Seen for acute visit today with shortness of breath and chest pain.  Most recent cath 09/01/2016 showed mild to moderate in-stent restenosis in LAD stent, mild nonobstructive disease in LCx and RCA.  He reports that he has been having dyspnea with minimal exertion.  Denies exertional chest pain but does report brief episodes of chest pain that last for seconds and resolved.  However he states that he is unable to walk around the store without getting short of breath.   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 Tamala Yates   CARDIAC CATHETERIZATION  04/04/2014   Procedure: CORONARY/BYPASS GRAFT CTO INTERVENTION;  Surgeon: Steven M Martinique, MD;  Location: [facility] CATH LAB;  Service: Cardiovascular;;   CARPAL TUNNEL RELEASE     bil   2019   CHOLECYSTECTOMY  2010   CORONARY ANGIOPLASTY WITH STENT PLACEMENT  04/04/2014   "2"   INGUINAL HERNIA REPAIR Bilateral 1966?   INTRAVASCULAR PRESSURE WIRE/FFR STUDY N/A 09/01/2016   Procedure:  Intravascular Pressure Wire/FFR Study;  Surgeon: Steven Crome, MD;  Location: [place] CV LAB;  Service: Cardiovascular;  Laterality: N/A;   LEFT HEART CATH AND CORONARY ANGIOGRAPHY N/A 09/01/2016   Procedure: Left Heart Cath and Coronary Angiography;  Surgeon: Steven Crome, MD;  Location: [place] CV LAB;  Service: Cardiovascular;  Laterality: N/A;   LEFT HEART CATHETERIZATION WITH CORONARY ANGIOGRAM N/A 01/30/2014   Procedure: LEFT HEART CATHETERIZATION WITH CORONARY ANGIOGRAM;  Surgeon: Steven Grooms, MD;  Location: [facility] C[facility] LAB;  Service: Cardiovascular;  Laterality: N/A;   LUMBAR DISC SURGERY  1980's X 2   "ruptured disc"   PERCUTANEOUS CORONARY STENT INTERVENTION (PCI-S)  01/30/2014   Procedure: PERCUTANEOUS CORONARY STENT INTERVENTION (PCI-S);  Surgeon: Steven Grooms, MD;  Location: [facility] CATH LAB;  Service: Cardiovascular;;   SHOULDER OPEN ROTATOR CUFF REPAIR Right 1990's   TOTAL KNEE ARTHROPLASTY Left 06/06/2018   Procedure: TOTAL KNEE ARTHROPLASTY;  Surgeon: Steven Arabian, MD;  Location: [facility] ORS;  Service: Orthopedics;  Laterality: Left;  26mn   ULTRASOUND GUIDANCE FOR VASCULAR ACCESS  09/01/2016   Procedure: Ultrasound Guidance For Vascular Access;  Surgeon: SBelva Crome MD;  [facility];  Service: Cardiovascular;;    Current Medications: Current Meds  Medication Sig   amLODipine (NORVASC) 5 MG tablet Take 1 tablet (5  mg total) by mouth daily.   aspirin EC 81 MG tablet Take 81 mg by mouth daily. Swallow whole.   atorvastatin (LIPITOR) 10 MG tablet TAKE 1 TABLET(10 MG) BY MOUTH DAILY   Faricimab-svoa (VABYSMO IZ) by Intravitreal route every 8 (eight) weeks. One injection in left eye every 8 weeks.   glimepiride (AMARYL) 1 MG tablet Take 1 tablet by mouth every morning.   losartan-hydrochlorothiazide (HYZAAR) 100-12.5 MG tablet Take 1 tablet by mouth daily.   metFORMIN (GLUCOPHAGE) 500 MG tablet Take 1,000 mg by mouth daily with breakfast.    Multiple  Vitamins-Minerals (ICAPS AREDS 2 PO) Take 1 capsule by mouth in the morning and at bedtime.   pantoprazole (PROTONIX) 40 MG tablet Take 1 tablet (40 mg total) by mouth daily.   tamsulosin (FLOMAX) 0.4 MG CAPS capsule Take 0.4 mg by mouth.     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  Tobacco Use   Smoking status: Former    Types: Cigars    Quit date: 04/29/2018    Years since quitting: 3.9   Smokeless tobacco: Never  Vaping Use   Vaping Use: Never used  Substance and Sexual Activity   Alcohol use: No    Alcohol/week: 0.0 standard drinks of alcohol   Drug use: No   Sexual activity: Yes  Other Topics Concern   Not on file  Social History Narrative   Not on file   Social Determinants of Health   Financial Resource Strain: Not on file  Food Insecurity: Not on file  Transportation Needs: Not on file  Physical Activity: Not on file  Stress: Not on file  Social Connections: Not on file     Family History: The patient's family history includes Cancer in his brother; Emphysema in his father; Heart disease in his brother and mother; Other in his sister.  ROS:   Please see the history of present illness.     All other systems reviewed and are negative.  EKGs/Labs/Other Studies Reviewed:    The following studies were reviewed today:   EKG:   04/03/2022: Sinus rhythm with PACs, anterior Q waves, rate 70   Recent Labs: 01/21/2022: ALT 22; BUN 15; Creatinine, Ser 0.84; Hemoglobin 12.7; Platelets 172.0; Potassium 4.0; Sodium 132  Recent Lipid Panel    Component Value Date/Time   CHOL 104 01/21/2022 0856   TRIG 116.0 01/21/2022 0856   HDL 34.70 (L) 01/21/2022 0856   CHOLHDL 3 01/21/2022 0856   VLDL 23.2 01/21/2022 0856   LDLCALC 46  01/21/2022 0856    Physical Exam:    VS:  BP 138/68 (BP Location: Left Arm, Patient Position: Sitting, Cuff Size: Normal)   Pulse 70   Ht '5\' 8"'$  (1.727 m)   Wt 210 lb (95.3 kg)   SpO2 99%   BMI 31.93 kg/m     Wt Readings from Last 3 Encounters:  04/03/22 210 lb (95.3 kg)  03/02/22 211 lb (95.7 kg)  02/26/22 211 lb 9.6 oz (96 kg)     GEN:  Well nourished, well developed in no acute distress HEENT: Normal NECK: No JVD; No carotid bruits LYMPHATICS: No lymphadenopathy CARDIAC: RRR, no murmurs, rubs, gallops RESPIRATORY:  Clear to auscultation without  rales, wheezing or rhonchi  ABDOMEN: Soft, non-tender, non-distended MUSCULOSKELETAL:  No edema; No deformity  SKIN: Warm and dry NEUROLOGIC:  Alert and oriented x 3 PSYCHIATRIC:  Normal affect   ASSESSMENT:    1. DOE (dyspnea on exertion)   2. Coronary artery disease involving native coronary artery of native heart, unspecified whether angina present   3. Essential hypertension   4. Hyperlipidemia, unspecified hyperlipidemia type    PLAN:    Dyspnea: He is reporting dyspnea with minimal exertion.  Could represent anginal equivalent given his coronary history.  Recommend Lexiscan Myoview.  Will also check echocardiogram to evaluate for structural heart disease  CAD:  status post CTO PCI to LAD with DES x 2 in 2016.  Most recent cath 09/01/2016 showed mild to moderate in-stent restenosis in LAD stent, mild nonobstructive disease in LCx and RCA -Continue aspirin, statin  Hypertension: On losartan-HCTZ 100-12.5 mg daily and amlodipine 5 mg daily.  Appears controlled  Hyperlipidemia: On atorvastatin 10 mg daily.  LDL 46 on 01/21/2022   Follow-up with Dr. Martinique or APP in 6 weeks  Shared Decision Making/Informed Consent The risks [chest pain, shortness of breath, cardiac arrhythmias, dizziness, blood pressure fluctuations, myocardial infarction, stroke/transient ischemic attack, nausea, vomiting, allergic reaction, radiation  exposure, metallic taste sensation and life-threatening complications (estimated to be 1 in 10,000)], benefits (risk stratification, diagnosing coronary artery disease, treatment guidance) and alternatives of a nuclear stress test were discussed in detail with Mr. Wolfinger and he agrees to proceed.   Medication Adjustments/Labs and Tests Ordered: Current medicines are reviewed at length with the patient today.  Concerns regarding medicines are outlined above.  Orders Placed This Encounter  Procedures   MYOCARDIAL PERFUSION IMAGING   EKG 12-Lead   ECHOCARDIOGRAM COMPLETE   No orders of the defined types were placed in this encounter.   Patient Instructions  Medication Instructions:  The current medical regimen is effective;  continue present plan and medications.  *If you need a refill on your cardiac medications before your next appointment, please call your pharmacy*    Testing/Procedures: Echocardiogram - Your physician has requested that you have an echocardiogram. Echocardiography is a painless test that uses sound waves to create images of your heart. It provides your doctor with information about the size and shape of your heart and how well your heart's chambers and valves are working. This procedure takes approximately one hour. There are no restrictions for this procedure.   Your physician has requested that you have a lexiscan myoview. For further information please visit [email or web address]. Please follow instruction sheet, as given.    Follow-Up: At [facility], you and your health needs are our priority.  As part of our continuing mission to provide you with exceptional heart care, we have created designated Provider Care Teams.  These Care Teams include your primary Cardiologist (physician) and Advanced Practice Providers (APPs -  Physician Assistants and Nurse Practitioners) who all work together to provide you with the care you need, when you need it.  We  recommend signing up for the patient portal called "MyChart".  Sign up information is provided on this After Visit Summary.  MyChart is used to connect with patients for Virtual Visits (Telemedicine).  Patients are able to view lab/test results, encounter notes, upcoming appointments, etc.  Non-urgent messages can be sent to your provider as well.   To learn more about what you can do with MyChart, go to [email or web address].    Your next appointment:   6  week(s)  The format for your next appointment:   In Person  Provider: Any APP    Keep follow up in May with Dr.Jordan        Signed, Donato Heinz, MD  04/03/2022 4:59 PM    [facility]

## 2022-04-03 ENCOUNTER — Encounter: Payer: Self-pay | Admitting: Cardiology

## 2022-04-03 ENCOUNTER — Ambulatory Visit: Payer: PPO | Attending: Cardiology | Admitting: Cardiology

## 2022-04-03 VITALS — BP 138/68 | HR 70 | Ht 68.0 in | Wt 210.0 lb

## 2022-04-03 DIAGNOSIS — I10 Essential (primary) hypertension: Secondary | ICD-10-CM | POA: Diagnosis not present

## 2022-04-03 DIAGNOSIS — R0609 Other forms of dyspnea: Secondary | ICD-10-CM | POA: Diagnosis not present

## 2022-04-03 DIAGNOSIS — E785 Hyperlipidemia, unspecified: Secondary | ICD-10-CM

## 2022-04-03 DIAGNOSIS — I2510 Atherosclerotic heart disease of native coronary artery without angina pectoris: Secondary | ICD-10-CM | POA: Diagnosis not present

## 2022-04-03 NOTE — Patient Instructions (Signed)
Medication Instructions:  The current medical regimen is effective;  continue present plan and medications.  *If you need a refill on your cardiac medications before your next appointment, please call your pharmacy*    Testing/Procedures: Echocardiogram - Your physician has requested that you have an echocardiogram. Echocardiography is a painless test that uses sound waves to create images of your heart. It provides your doctor with information about the size and shape of your heart and how well your heart's chambers and valves are working. This procedure takes approximately one hour. There are no restrictions for this procedure.   Your physician has requested that you have a lexiscan myoview. For further information please visit [email or web address]. Please follow instruction sheet, as given.    Follow-Up: At [facility], you and your health needs are our priority.  As part of our continuing mission to provide you with exceptional heart care, we have created designated Provider Care Teams.  These Care Teams include your primary Cardiologist (physician) and Advanced Practice Providers (APPs -  Physician Assistants and Nurse Practitioners) who all work together to provide you with the care you need, when you need it.  We recommend signing up for the patient portal called "MyChart".  Sign up information is provided on this After Visit Summary.  MyChart is used to connect with patients for Virtual Visits (Telemedicine).  Patients are able to view lab/test results, encounter notes, upcoming appointments, etc.  Non-urgent messages can be sent to your provider as well.   To learn more about what you can do with MyChart, go to [email or web address].    Your next appointment:   6 week(s)  The format for your next appointment:   In Person  Provider: Any APP    Keep follow up in May with Dr.Jordan

## 2022-04-06 NOTE — Addendum Note (Signed)
Addended by: Caprice Beaver T on: 04/06/2022 02:53 PM   Modules accepted: Orders

## 2022-04-07 NOTE — Addendum Note (Signed)
Addended by: Oswaldo Milian on: 04/07/2022 06:51 AM   Modules accepted: Orders

## 2022-04-08 ENCOUNTER — Telehealth (HOSPITAL_COMMUNITY): Payer: Self-pay | Admitting: Cardiology

## 2022-04-08 NOTE — Telephone Encounter (Signed)
I called patient to schedule Myoview and echo. Patient declined to schedule Myoview and stated that he felt horrible with his BP when he had done last time and did not want to do again. Patient did schedule echocardiogram.  Order will be removed from the [facility]. Thank you

## 2022-04-08 NOTE — Telephone Encounter (Signed)
Given his symptoms, recommend myoview to evaluate for whether symptoms are coming from his heart.  Would reiterate that Steven Yates is a very short acting medication and we can give medication to reverse its effects if symptoms persist

## 2022-04-17 NOTE — Telephone Encounter (Signed)
Returned call to patient and advised him of Dr. Newman Nickels recommendations. Patient aware and would like to proceed with Lexiscan- patient would like to do this at the same day as his Echo if possible. Advised patient I would forward message to Scheduling and to Dr. Gardiner Rhyme to make them aware. Patient verbalized understanding.

## 2022-04-17 NOTE — Telephone Encounter (Signed)
Pt is returning call and is requesting return call.

## 2022-04-17 NOTE — Telephone Encounter (Signed)
Left detailed message (ok per DPR)-request call back to discuss.

## 2022-04-21 ENCOUNTER — Telehealth (HOSPITAL_COMMUNITY): Payer: Self-pay | Admitting: *Deleted

## 2022-04-21 NOTE — Telephone Encounter (Signed)
Patient given detailed instructions per Myocardial Perfusion Study Information Sheet for the test on 04/22/22  Patient notified to arrive 15 minutes early and that it is imperative to arrive on time for appointment to keep from having the test rescheduled.  If you need to cancel or reschedule your appointment, please call the office within 24 hours of your appointment. . Patient verbalized understanding. Kirstie Peri, RN

## 2022-04-22 ENCOUNTER — Encounter (HOSPITAL_COMMUNITY): Payer: PPO

## 2022-04-24 ENCOUNTER — Ambulatory Visit (HOSPITAL_COMMUNITY): Payer: PPO | Attending: Cardiology

## 2022-04-24 ENCOUNTER — Ambulatory Visit (HOSPITAL_BASED_OUTPATIENT_CLINIC_OR_DEPARTMENT_OTHER): Payer: PPO

## 2022-04-24 DIAGNOSIS — R0609 Other forms of dyspnea: Secondary | ICD-10-CM

## 2022-04-24 LAB — MYOCARDIAL PERFUSION IMAGING
LV dias vol: 81 mL (ref 62–150)
LV sys vol: 35 mL
Nuc Stress EF: 56 %
Peak HR: 79 {beats}/min
Rest HR: 53 {beats}/min
Rest Nuclear Isotope Dose: 10.7 mCi
SDS: 4
SRS: 0
SSS: 4
ST Depression (mm): 0 mm
Stress Nuclear Isotope Dose: 32.7 mCi
TID: 0.93

## 2022-04-24 LAB — ECHOCARDIOGRAM COMPLETE
AR max vel: 2.08 cm2
AV Area VTI: 2.24 cm2
AV Area mean vel: 2.28 cm2
AV Mean grad: 4.5 mmHg
AV Peak grad: 11.2 mmHg
Ao pk vel: 1.67 m/s
Area-P 1/2: 3.24 cm2
S' Lateral: 3.2 cm

## 2022-04-24 MED ORDER — TECHNETIUM TC 99M TETROFOSMIN IV KIT
31.5000 | PACK | Freq: Once | INTRAVENOUS | Status: AC | PRN
  Administered 2022-04-24: 31.5 via INTRAVENOUS

## 2022-04-24 MED ORDER — TECHNETIUM TC 99M TETROFOSMIN IV KIT
10.7000 | PACK | Freq: Once | INTRAVENOUS | Status: AC | PRN
  Administered 2022-04-24: 10.7 via INTRAVENOUS

## 2022-04-24 MED ORDER — REGADENOSON 0.4 MG/5ML IV SOLN
0.4000 mg | Freq: Once | INTRAVENOUS | Status: AC
  Administered 2022-04-24: 0.4 mg via INTRAVENOUS

## 2022-04-27 ENCOUNTER — Telehealth: Payer: Self-pay | Admitting: Cardiology

## 2022-04-27 NOTE — Telephone Encounter (Signed)
Patient asked for results of lexiscan. Advised pt that once Dr. Gardiner Rhyme writes his notes, we will notify pt.

## 2022-04-27 NOTE — Telephone Encounter (Signed)
Patient is requesting a call back to discuss stress test results and next steps.

## 2022-04-27 NOTE — Telephone Encounter (Signed)
See result note.  

## 2022-04-28 ENCOUNTER — Other Ambulatory Visit (HOSPITAL_COMMUNITY): Payer: PPO

## 2022-04-28 ENCOUNTER — Encounter (HOSPITAL_COMMUNITY): Payer: PPO

## 2022-04-30 DIAGNOSIS — M65341 Trigger finger, right ring finger: Secondary | ICD-10-CM | POA: Diagnosis not present

## 2022-05-22 ENCOUNTER — Telehealth: Payer: Self-pay | Admitting: Family Medicine

## 2022-05-22 NOTE — Telephone Encounter (Signed)
Contacted Phyllis Ginger. Saltos to schedule their annual wellness visit. Appointment made for 06/01/22.  [facility] [employer] AW[facility] direct phone # [phone]

## 2022-06-01 ENCOUNTER — Ambulatory Visit: Payer: PPO

## 2022-06-02 ENCOUNTER — Telehealth: Payer: Self-pay | Admitting: Family Medicine

## 2022-06-02 NOTE — Telephone Encounter (Signed)
Pt is wondering if he could have a lab visit before his OV with Dr Ethelene Hal on 06/09/22. I let him know we typically do not do this. He said he had a hard time reading his results on mychart from his last visit. Please advise pt at [phone]

## 2022-06-02 NOTE — Telephone Encounter (Signed)
Spoke with patient who verbally understood labs will need to be drawn at the day of visit. And someone will call to go over results.

## 2022-06-09 ENCOUNTER — Ambulatory Visit (INDEPENDENT_AMBULATORY_CARE_PROVIDER_SITE_OTHER): Payer: PPO | Admitting: Family Medicine

## 2022-06-09 ENCOUNTER — Encounter: Payer: Self-pay | Admitting: Family Medicine

## 2022-06-09 VITALS — BP 124/60 | HR 61 | Temp 98.1°F | Ht 68.0 in | Wt 210.0 lb

## 2022-06-09 DIAGNOSIS — E538 Deficiency of other specified B group vitamins: Secondary | ICD-10-CM | POA: Diagnosis not present

## 2022-06-09 DIAGNOSIS — R7303 Prediabetes: Secondary | ICD-10-CM | POA: Diagnosis not present

## 2022-06-09 DIAGNOSIS — Z6831 Body mass index (BMI) 31.0-31.9, adult: Secondary | ICD-10-CM | POA: Diagnosis not present

## 2022-06-09 DIAGNOSIS — D649 Anemia, unspecified: Secondary | ICD-10-CM | POA: Diagnosis not present

## 2022-06-09 DIAGNOSIS — R5383 Other fatigue: Secondary | ICD-10-CM
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
## 2022-06-10 LAB — CBC
HCT: 36.8 % — ABNORMAL LOW (ref 39.0–52.0)
Hemoglobin: 13 g/dL (ref 13.0–17.0)
MCHC: 35.5 g/dL (ref 30.0–36.0)
MCV: 100.6 fl — ABNORMAL HIGH (ref 78.0–100.0)
Platelets: 203 10*3/uL (ref 150.0–400.0)
RBC: 3.65 Mil/uL — ABNORMAL LOW (ref 4.22–5.81)
RDW: 14.6 % (ref 11.5–15.5)
WBC: 9.1 10*3/uL (ref 4.0–10.5)

## 2022-06-10 LAB — BASIC METABOLIC PANEL
BUN: 17 mg/dL (ref 6–23)
CO2: 26 mEq/L (ref 19–32)
Calcium: 9 mg/dL (ref 8.4–10.5)
Chloride: 98 mEq/L (ref 96–112)
Creatinine, Ser: 1.07 mg/dL (ref 0.40–1.50)
GFR: 65.83 mL/min (ref >60.00)
Glucose, Bld: 149 mg/dL — ABNORMAL HIGH (ref 70–99)
Potassium: 3.8 mEq/L (ref 3.5–5.1)
Sodium: 134 mEq/L — ABNORMAL LOW (ref 135–145)

## 2022-06-10 LAB — B12 AND FOLATE PANEL
Folate: 12.1 ng/mL (ref >5.9)
Vitamin B-12: 155 pg/mL — ABNORMAL LOW (ref 211–911)

## 2022-06-10 LAB — IRON,TIBC AND FERRITIN PANEL
%SAT: 21 % (calc) (ref 20–48)
Ferritin: 75 ng/mL (ref 24–380)
Iron: 64 ug/dL (ref 50–180)
TIBC: 311 mcg/dL (calc) (ref 250–425)

## 2022-06-10 LAB — HEMOGLOBIN A1C: Hgb A1c MFr Bld: 6.1 % (ref 4.6–6.5)

## 2022-06-10 LAB — TSH: TSH: 1.29 u[IU]/mL (ref 0.35–5.50)

## 2022-06-12 ENCOUNTER — Telehealth: Payer: Self-pay | Admitting: Family Medicine

## 2022-06-12 MED ORDER — VITAMIN B-12 1000 MCG PO TABS: 1000.0000 ug | ORAL_TABLET | Freq: Every day | ORAL | 1 refills | Status: DC

## 2022-06-12 MED ORDER — IRON (FERROUS SULFATE) 325 (65 FE) MG PO TABS: ORAL_TABLET | ORAL | 1 refills | Status: DC

## 2022-06-12 NOTE — Telephone Encounter (Signed)
Pt would like a call about his labs from Tuesday.

## 2022-06-12 NOTE — Addendum Note (Signed)
Addended by: Abelino Derrick A on: 06/12/2022 01:06 PM   Modules accepted: Orders

## 2022-06-16 NOTE — Telephone Encounter (Signed)
Returned patients call patient verbalized understanding of labs and recommendations will start on supplements today.

## 2022-06-17 DIAGNOSIS — H353221 Exudative age-related macular degeneration, left eye, with active choroidal neovascularization: Secondary | ICD-10-CM | POA: Diagnosis not present

## 2022-07-02 ENCOUNTER — Ambulatory Visit (INDEPENDENT_AMBULATORY_CARE_PROVIDER_SITE_OTHER): Payer: PPO | Admitting: Nurse Practitioner

## 2022-07-02 ENCOUNTER — Ambulatory Visit (HOSPITAL_BASED_OUTPATIENT_CLINIC_OR_DEPARTMENT_OTHER)
Admission: RE | Admit: 2022-07-02 | Discharge: 2022-07-02 | Disposition: A | Discharge status: Home / Self Care | Payer: PPO | Source: Ambulatory Visit | Attending: Nurse Practitioner | Admitting: Nurse Practitioner

## 2022-07-02 ENCOUNTER — Encounter: Payer: Self-pay | Admitting: Nurse Practitioner

## 2022-07-02 VITALS — BP 100/68 | HR 57 | Temp 98.0°F | Resp 16 | Ht 68.0 in | Wt 210.0 lb

## 2022-07-02 DIAGNOSIS — R600 Localized edema: Secondary | ICD-10-CM | POA: Diagnosis not present

## 2022-07-02 DIAGNOSIS — M7989 Other specified soft tissue disorders: Secondary | ICD-10-CM | POA: Insufficient documentation

## 2022-07-02 DIAGNOSIS — G629 Polyneuropathy, unspecified: Secondary | ICD-10-CM

## 2022-07-02 NOTE — Patient Instructions (Addendum)
Wear knee high compression stocking, remove at bedtime.  Go to [facility], [place], [place] for venous US.  Call office with name of diuretic used at home

## 2022-07-02 NOTE — Progress Notes (Signed)
Established Patient Visit  Patient: Steven Yates   DOB: [birth year]   82 y.o. Male  MRN: [id number] Visit Date: 07/02/2022  Subjective:    Chief Complaint  Patient presents with   Edema    Right foot. Noticed a week ago and took a diuretic which helped    HPI Steven Yates presents with R. LE edema x 1week, no improvement with elevation and furosemide 20mg  x 1dose. He denies any pain or redness or injury or weakness. No recent travel or prolong immobilization.  Reviewed medical, surgical, and social history today  Medications: Outpatient Medications Prior to Visit  Medication Sig   amLODipine (NORVASC) 5 MG tablet Take 1 tablet (5 mg total) by mouth daily.   aspirin EC 81 MG tablet Take 81 mg by mouth daily. Swallow whole.   atorvastatin (LIPITOR) 10 MG tablet TAKE 1 TABLET(10 MG) BY MOUTH DAILY   cyanocobalamin (VITAMIN B12) 1000 MCG tablet Take 1 tablet (1,000 mcg total) by mouth daily.   Faricimab-svoa (VABYSMO IZ) by Intravitreal route every 8 (eight) weeks. One injection in left eye every 8 weeks.   Iron, Ferrous Sulfate, 325 (65 Fe) MG TABS Take 1 tablet every other day.   losartan-hydrochlorothiazide (HYZAAR) 100-12.5 MG tablet Take 1 tablet by mouth daily.   metFORMIN (GLUCOPHAGE) 500 MG tablet Take 1,000 mg by mouth daily with breakfast.    Multiple Vitamins-Minerals (ICAPS AREDS 2 PO) Take 1 capsule by mouth in the morning and at bedtime.   pantoprazole (PROTONIX) 40 MG tablet Take 1 tablet (40 mg total) by mouth daily.   tamsulosin (FLOMAX) 0.4 MG CAPS capsule Take 0.4 mg by mouth.   No facility-administered medications prior to visit.   Reviewed past medical and social history.   ROS per HPI above      Objective:  BP 100/68 (BP Location: Right Arm, Patient Position: Sitting, Cuff Size: Large)   Pulse (!) 57   Temp 98 F (36.7 C)   Resp 16   Ht 5\' 8"  (1.727 m)   Wt 210 lb (95.3 kg)   SpO2 100%   BMI 31.93 kg/m      Physical  Exam Vitals reviewed.  Cardiovascular:     Rate and Rhythm: Normal rate.     Pulses: Normal pulses.          Dorsalis pedis pulses are 2+ on the right side and 2+ on the left side.       Posterior tibial pulses are 2+ on the right side and 2+ on the left side.  Pulmonary:     Effort: Pulmonary effort is normal.  Musculoskeletal:        General: No tenderness.     Right lower leg: Edema present.     Right foot: Charcot foot and prominent metatarsal heads present. No bunion.     Left foot: Charcot foot and prominent metatarsal heads present. No bunion.  Feet:     Right foot:     Protective Sensation: 8 sites tested.  3 sites sensed.     Skin integrity: Skin integrity normal.     Toenail Condition: Right toenails are normal.     Left foot:     Protective Sensation: 9 sites tested.  3 sites sensed.     Skin integrity: Skin integrity normal.     Toenail Condition: Left toenails are normal.  Skin:    Findings: No erythema.  Neurological:  Mental Status: He is alert and oriented to person, place, and time.     No results found for any visits on 07/02/22.    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 Current use of B12 supplement and Dm controlled with hgbA1c at 6.1% Advised to Wear knee high compression stocking, remove at bedtime. Go to [place], [place], [place] for venous US. Today at 3:45pm.  Return if symptoms worsen or fail to improve.     Wilfred Lacy, NP

## 2022-07-03 ENCOUNTER — Ambulatory Visit (HOSPITAL_BASED_OUTPATIENT_CLINIC_OR_DEPARTMENT_OTHER): Payer: PPO

## 2022-07-03 ENCOUNTER — Other Ambulatory Visit: Payer: PPO

## 2022-07-07 ENCOUNTER — Telehealth: Payer: Self-pay | Admitting: Family Medicine

## 2022-07-07 NOTE — Telephone Encounter (Signed)
Contacted Steven Yates. [place] to schedule their annual wellness visit. Patient declined to schedule AWV at this time.  Steven Yates AWV direct phone # [phone]   Patient returned my call Declined doing AWV  he stated Dr Doreene Burke takes care of him   DO NOT CALL

## 2022-07-07 NOTE — Telephone Encounter (Signed)
Called patient to schedule Medicare Annual Wellness Visit (AWV). Left message for patient to call back and schedule Medicare Annual Wellness Visit (AWV).  Last date of AWV: after 07/05/21 per snapshot  Please schedule an appointment at any time with [facility] or [facility].  If any questions, please contact me at [phone].  Thank you ,  Rudell Cobb AWV direct phone # [phone]

## 2022-07-08 ENCOUNTER — Other Ambulatory Visit: Payer: Self-pay | Admitting: Family Medicine

## 2022-07-08 DIAGNOSIS — I10 Essential (primary) hypertension: Secondary | ICD-10-CM

## 2022-07-16 ENCOUNTER — Ambulatory Visit (INDEPENDENT_AMBULATORY_CARE_PROVIDER_SITE_OTHER): Payer: PPO | Admitting: Family Medicine

## 2022-07-16 ENCOUNTER — Encounter: Payer: Self-pay | Admitting: Family Medicine

## 2022-07-16 VITALS — BP 122/76 | HR 59 | Temp 98.4°F | Ht 68.0 in | Wt 207.6 lb

## 2022-07-16 DIAGNOSIS — I872 Venous insufficiency (chronic) (peripheral): Secondary | ICD-10-CM

## 2022-07-16 DIAGNOSIS — E538 Deficiency of other specified B group vitamins: Secondary | ICD-10-CM | POA: Diagnosis not present

## 2022-07-16 NOTE — Progress Notes (Signed)
Established Patient Office Visit   Subjective:  Patient ID: Steven Yates. [place], male    DOB: Aug 01, 1940  Age: 82 y.o. MRN: [id number]  Chief Complaint  Patient presents with   Edema    Pt notes his swelling is somewhat better but not resolved     HPI Encounter Diagnoses  Name Primary?   B12 deficiency Yes   Venous incompetence    For follow-up of recent onset of swelling in his right greater than left leg.  Advised to wear compression stockings.  Wore them for a few days they helped and then he discontinued them.  Checked for DVT was negative.  Recent echocardiogram was excellent.  Recent checkup renal and liver function were both normal.   Review of Systems  Constitutional: Negative.   HENT: Negative.    Eyes:  Negative for blurred vision, discharge and redness.  Respiratory: Negative.    Cardiovascular: Negative.   Gastrointestinal:  Negative for abdominal pain.  Genitourinary: Negative.   Musculoskeletal: Negative.  Negative for myalgias.  Skin:  Negative for rash.  Neurological:  Negative for tingling, loss of consciousness and weakness.  Endo/Heme/Allergies:  Negative for polydipsia.     Current Outpatient Medications:    amLODipine (NORVASC) 5 MG tablet, Take 1 tablet (5 mg total) by mouth daily., Disp: 90 tablet, Rfl: 1   aspirin EC 81 MG tablet, Take 81 mg by mouth daily. Swallow whole., Disp: , Rfl:    atorvastatin (LIPITOR) 10 MG tablet, TAKE 1 TABLET(10 MG) BY MOUTH DAILY, Disp: 90 tablet, Rfl: 3   cyanocobalamin (VITAMIN B12) 1000 MCG tablet, Take 1 tablet (1,000 mcg total) by mouth daily., Disp: 90 tablet, Rfl: 1   Faricimab-svoa (VABYSMO IZ), by Intravitreal route every 8 (eight) weeks. One injection in left eye every 8 weeks., Disp: , Rfl:    Iron, Ferrous Sulfate, 325 (65 Fe) MG TABS, Take 1 tablet every other day., Disp: 45 tablet, Rfl: 1   losartan-hydrochlorothiazide (HYZAAR) 100-12.5 MG tablet, TAKE 1 TABLET BY MOUTH DAILY, Disp: 90 tablet, Rfl: 0    metFORMIN (GLUCOPHAGE) 500 MG tablet, Take 1,000 mg by mouth daily with breakfast. , Disp: , Rfl:    Multiple Vitamins-Minerals (ICAPS AREDS 2 PO), Take 1 capsule by mouth in the morning and at bedtime., Disp: , Rfl:    pantoprazole (PROTONIX) 40 MG tablet, Take 1 tablet (40 mg total) by mouth daily., Disp: 30 tablet, Rfl: 11   tamsulosin (FLOMAX) 0.4 MG CAPS capsule, Take 0.4 mg by mouth., Disp: , Rfl:    Objective:     BP 122/76   Pulse (!) 59   Temp 98.4 F (36.9 C) (Oral)   Ht  (1.727 m)   Wt 207 lb 9.6 oz (94.2 kg)   SpO2 96%   BMI 31.57 kg/m    Physical Exam Constitutional:      General: He is not in acute distress.    Appearance: Normal appearance. He is not ill-appearing, toxic-appearing or diaphoretic.  HENT:     Head: Normocephalic and atraumatic.     Right Ear: External ear normal.     Left Ear: External ear normal.  Eyes:     General: No scleral icterus.       Right eye: No discharge.        Left eye: No discharge.     Extraocular Movements: Extraocular movements intact.     Conjunctiva/sclera: Conjunctivae normal.  Pulmonary:     Effort: Pulmonary effort is normal. No respiratory  distress.  Musculoskeletal:     Right lower leg: No edema.     Left lower leg: No edema.     Comments: Charcot feet.   Skin:    General: Skin is warm and dry.  Neurological:     Mental Status: He is alert and oriented to person, place, and time.  Psychiatric:        Mood and Affect: Mood normal.        Behavior: Behavior normal.      No results found for any visits on 07/16/22.    The ASCVD Risk score (Arnett DK, et al., 2019) failed to calculate for the following reasons:   The valid total cholesterol range is 130 to 320 mg/dL    Assessment & Plan:   B12 deficiency  Venous incompetence    Return Has scheduled appointment in June..  Continue high-dose B12 supplement.  Continue compression stockings daily.  Mliss Sax, MD

## 2022-07-23 ENCOUNTER — Ambulatory Visit: Payer: PPO | Admitting: Family Medicine

## 2022-08-01 NOTE — Progress Notes (Deleted)
Cardiology Office Note:    Date:  08/01/2022   ID:  Steven Yates, Steven Yates March 16, 1941, MRN [id number]  PCP:  Mliss Sax, MD  Cardiologist:  Lesleigh Noe, MD (Inactive)   Referring MD: Mliss Sax   No chief complaint on file.    History of Present Illness:    Steven Jayson. Yates is a 82 y.o. male with a hx of  ED, DMT2, CAD s/p CTO PCI to LAD with DESx2 (03/2014), untreated hypertension, HLD, LAFB and multiple medication intolerances.   He has chronic dyspnea on exertion.  This is not changed. He was seen by Dr Bjorn Pippin for this complaint earlier this year. Myoview study was done and was low risk without significant ischemia and good EF. Echo was also done and was normal.  It has not impacted his physical activities.  Still playing golf.  No chest pain, orthopnea, or PND.  Denies lower extremity swelling.  No palpitations or syncope.  Not having claudication.  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 Katrinka Blazing   CARDIAC CATHETERIZATION  04/04/2014   Procedure: CORONARY/BYPASS GRAFT CTO INTERVENTION;  Surgeon: Lexington Devine M [place], MD;  Location: [facility] CATH LAB;  Service: Cardiovascular;;   CARPAL TUNNEL RELEASE     bil   2019   CHOLECYSTECTOMY  2010   CORONARY ANGIOPLASTY WITH STENT PLACEMENT  04/04/2014   "2"   CORONARY PRESSURE/FFR STUDY N/A 09/01/2016   Procedure: Intravascular Pressure Wire/FFR Study;  Surgeon: Lyn Records, MD;  Location: [facility] INVASIVE CV LAB;  Service: Cardiovascular;  Laterality: N/A;   INGUINAL HERNIA REPAIR Bilateral 1966?   LEFT  HEART CATH AND CORONARY ANGIOGRAPHY N/A 09/01/2016   Procedure: Left Heart Cath and Coronary Angiography;  Surgeon: Lyn Records, MD;  Location: [facility] INVASIVE CV LAB;  Service: Cardiovascular;  Laterality: N/A;   LEFT HEART CATHETERIZATION WITH CORONARY ANGIOGRAM N/A 01/30/2014   Procedure: LEFT HEART CATHETERIZATION WITH CORONARY ANGIOGRAM;  Surgeon: Lesleigh Noe, MD;  Location: [facility] CATH LAB;  Service: Cardiovascular;  Laterality: N/A;   LUMBAR DISC SURGERY  1980's X 2   "ruptured disc"   PERCUTANEOUS CORONARY STENT INTERVENTION (PCI-S)  01/30/2014   Procedure: PERCUTANEOUS CORONARY STENT INTERVENTION (PCI-S);  Surgeon: Lesleigh Noe, MD;  Location: [facility] CATH LAB;  Service: Cardiovascular;;   SHOULDER OPEN ROTATOR CUFF REPAIR Right 1990's   TOTAL KNEE ARTHROPLASTY Left 06/06/2018   Procedure: TOTAL KNEE ARTHROPLASTY;  Surgeon: Ollen Gross, MD;  Location: [facility] ORS;  Service: Orthopedics;  Laterality: Left;    ULTRASOUND GUIDANCE FOR VASCULAR ACCESS  09/01/2016   Procedure: Ultrasound Guidance For Vascular Access;  Surgeon: Lyn Records, MD;  Location: [facility] INVASIVE CV LAB;  Service: Cardiovascular;;    Current Medications: No outpatient medications have been marked as taking for the 08/06/22 encounter (Appointment) with [place], Yoshiharu Brassell M, MD.     Allergies:   Lisinopril   Social History   Socioeconomic History   Marital status: Married  Spouse name: Not on file   Number of children: 2   Years of education: Not on file   Highest education level: Not on file  Occupational History   Occupation: [employer]  Tobacco Use   Smoking status: Former    Types: Cigars    Quit date: 04/29/2018    Years since quitting: 4.2   Smokeless tobacco: Never  Vaping Use   Vaping Use: Never used  Substance and Sexual Activity   Alcohol use: No    Alcohol/week: 0.0 standard drinks of alcohol   Drug use: No   Sexual activity: Yes  Other Topics Concern   Not on file  Social History  Narrative   Not on file   Social Determinants of Health   Financial Resource Strain: Not on file  Food Insecurity: Not on file  Transportation Needs: Not on file  Physical Activity: Not on file  Stress: Not on file  Social Connections: Not on file     Family History: The patient's family history includes Cancer in his brother; Emphysema in his father; Heart disease in his brother and mother; Other in his sister.  ROS:   Please see the history of present illness.    Has a small nodule in the submandibular area of the right jaw.  Has had biopsies and scanning but still unsure what it is.  Plan now to have surgery by Dr. Brynda Peon.  He asked that we cleared the patient for surgery.  All other systems reviewed and are negative.  EKGs/Labs/Other Studies Reviewed:    The following studies were reviewed today:  Cardiac cath 09/01/16: Conclusion  Mild to moderate in-stent restenosis focally in the proximal one third and also in the distal one third of the overlapping LAD stents. FFR across the entire stented segment is 0.82. Widely patent circumflex and right coronary artery. Normal left ventricular systolic function.   RECOMMENDATIONS: Continue medical therapy Myoview 04/24/22: Study Highlights      Findings are consistent with infarction with peri-infarct ischemia. The study is intermediate risk.   No ST deviation was noted.   Left ventricular function is normal. Nuclear stress EF: 56 %. The left ventricular ejection fraction is normal (55-65%). End diastolic cavity size is normal.   Prior study available for comparison from 01/29/2014.   small size and mild intensity apical/apical anterior fixed lesion c/w possible scar with peri-infarct ischemia.  Prior nuclear study was low risk, do not have the images to compare.    Echo 04/24/22: IMPRESSIONS     1. Left ventricular ejection fraction, by estimation, is 60 to 65%. The  left ventricle has normal function. The left ventricle  has no regional  wall motion abnormalities. Left ventricular diastolic parameters are  consistent with Grade I diastolic  dysfunction (impaired relaxation).   2. Right ventricular systolic function is normal. The right ventricular  size is normal. There is normal pulmonary artery systolic pressure.   3. Left atrial size was mildly dilated.   4. The mitral valve is normal in structure. Trivial mitral valve  regurgitation. No evidence of mitral stenosis.   5. The aortic valve is tricuspid. Aortic valve regurgitation is not  visualized. No aortic stenosis is present.   6. The inferior vena cava is normal in size with greater than 50%  respiratory variability, suggesting right atrial pressure of 3 mmHg.   EKG:  EKG sinus bradycardia, 56 bpm, PACs, left axis deviation, interventricular conduction delay.  No major change when compared to  prior.  Recent Labs: 01/21/2022: ALT 22 06/09/2022: BUN 17; Creatinine, Ser 1.07; Hemoglobin 13.0; Platelets 203.0; Potassium 3.8; Sodium 134; TSH 1.29  Recent Lipid Panel    Component Value Date/Time   CHOL 104 01/21/2022 0856   TRIG 116.0 01/21/2022 0856   HDL 34.70 (L) 01/21/2022 0856   CHOLHDL 3 01/21/2022 0856   VLDL 23.2 01/21/2022 0856   LDLCALC 46 01/21/2022 0856    Physical Exam:    VS:  There were no vitals taken for this visit.    Wt Readings from Last 3 Encounters:  07/16/22 207 lb 9.6 oz (94.2 kg)  07/02/22 210 lb (95.3 kg)  06/09/22 210 lb (95.3 kg)     GEN: Overweight. No acute distress HEENT: Normal NECK: No JVD. LYMPHATICS: No lymphadenopathy CARDIAC: No murmur. RRR no gallop, or edema. VASCULAR:  Normal Pulses. No bruits. RESPIRATORY:  Clear to auscultation without rales, wheezing or rhonchi  ABDOMEN: Soft, non-tender, non-distended, No pulsatile mass, MUSCULOSKELETAL: No deformity  SKIN: Warm and dry NEUROLOGIC:  Alert and oriented x 3 PSYCHIATRIC:  Normal affect   ASSESSMENT:    No diagnosis found.  PLAN:    In  order of problems listed above:  Continue physical activity.  150 minutes of more physical activity per week.  Secondary prevention discussed. EKG is unchanged. Blood pressure is well controlled for age.  No additions.  Continue Hyzaar 50/12.5 mg/day. Continue Lipitor 10 mg/day.  Last LDL cholesterol was 56 in April. Cleared for upcoming resection of small nodule in the right submandibular area by Dr. Brynda Peon.   Overall education and awareness concerning secondary risk prevention was discussed in detail: LDL less than 70, hemoglobin A1c less than 7, blood pressure target less than 130/80 mmHg, >150 minutes of moderate aerobic activity per week, avoidance of smoking, weight control (via diet and exercise), and continued surveillance/management of/for obstructive sleep apnea.   Cardiology clinical follow-up in 1 year   Medication Adjustments/Labs and Tests Ordered: Current medicines are reviewed at length with the patient today.  Concerns regarding medicines are outlined above.  No orders of the defined types were placed in this encounter.  No orders of the defined types were placed in this encounter.    There are no Patient Instructions on file for this visit.   Signed, Roniesha Hollingshead [place], MD  08/01/2022 7:28 PM    [facility]

## 2022-08-06 ENCOUNTER — Ambulatory Visit: Payer: PPO | Admitting: Cardiology

## 2022-08-06 DIAGNOSIS — L814 Other melanin hyperpigmentation: Secondary | ICD-10-CM | POA: Diagnosis not present

## 2022-08-06 DIAGNOSIS — L821 Other seborrheic keratosis: Secondary | ICD-10-CM | POA: Diagnosis not present

## 2022-08-06 DIAGNOSIS — L820 Inflamed seborrheic keratosis: Secondary | ICD-10-CM | POA: Diagnosis not present

## 2022-08-06 DIAGNOSIS — L570 Actinic keratosis: Secondary | ICD-10-CM | POA: Diagnosis not present

## 2022-08-06 DIAGNOSIS — D225 Melanocytic nevi of trunk: Secondary | ICD-10-CM | POA: Diagnosis not present

## 2022-08-27 ENCOUNTER — Other Ambulatory Visit: Payer: Self-pay | Admitting: Family Medicine

## 2022-08-27 DIAGNOSIS — I10 Essential (primary) hypertension: Secondary | ICD-10-CM

## 2022-09-11 ENCOUNTER — Ambulatory Visit (INDEPENDENT_AMBULATORY_CARE_PROVIDER_SITE_OTHER): Payer: PPO | Admitting: Family Medicine

## 2022-09-11 ENCOUNTER — Encounter: Payer: Self-pay | Admitting: Family Medicine

## 2022-09-11 VITALS — BP 122/68 | HR 66 | Temp 97.7°F | Ht 68.0 in | Wt 203.6 lb

## 2022-09-11 DIAGNOSIS — E611 Iron deficiency: Secondary | ICD-10-CM

## 2022-09-11 DIAGNOSIS — R7303 Prediabetes: Secondary | ICD-10-CM

## 2022-09-11 DIAGNOSIS — D649 Anemia, unspecified: Secondary | ICD-10-CM

## 2022-09-11 DIAGNOSIS — R5383 Other fatigue: Secondary | ICD-10-CM

## 2022-09-11 DIAGNOSIS — E538 Deficiency of other specified B group vitamins: Secondary | ICD-10-CM

## 2022-09-11 LAB — CBC
HCT: 33.7 % — ABNORMAL LOW (ref 39.0–52.0)
Hemoglobin: 11.7 g/dL — ABNORMAL LOW (ref 13.0–17.0)
MCHC: 34.9 g/dL (ref 30.0–36.0)
MCV: 102.7 fl — ABNORMAL HIGH (ref 78.0–100.0)
Platelets: 182 10*3/uL (ref 150.0–400.0)
RBC: 3.28 Mil/uL — ABNORMAL LOW (ref 4.22–5.81)
RDW: 14.7 % (ref 11.5–15.5)
WBC: 8.5 10*3/uL (ref 4.0–10.5)

## 2022-09-11 LAB — BASIC METABOLIC PANEL
BUN: 14 mg/dL (ref 6–23)
CO2: 25 mEq/L (ref 19–32)
Calcium: 9.1 mg/dL (ref 8.4–10.5)
Chloride: 97 mEq/L (ref 96–112)
Creatinine, Ser: 1.01 mg/dL (ref 0.40–1.50)
GFR: 70.43 mL/min (ref >60.00)
Glucose, Bld: 186 mg/dL — ABNORMAL HIGH (ref 70–99)
Potassium: 3.9 mEq/L (ref 3.5–5.1)
Sodium: 133 mEq/L — ABNORMAL LOW (ref 135–145)

## 2022-09-11 LAB — HEMOGLOBIN A1C: Hgb A1c MFr Bld: 7.2 % — ABNORMAL HIGH (ref 4.6–6.5)

## 2022-09-11 LAB — VITAMIN B12: Vitamin B-12: 415 pg/mL (ref 211–911)

## 2022-09-11 NOTE — Progress Notes (Signed)
Established Patient Office Visit   Subjective:  Patient ID: Steven Yates, male    DOB: 08/11/1940  Age: 82 y.o. MRN: [id number]  Chief Complaint  Patient presents with   Medical Management of Chronic Issues    3 month follow up on BP no concerns. Patient not fasting.     HPI Encounter Diagnoses  Name Primary?   B12 deficiency Yes   Prediabetes    Normocytic anemia    Other fatigue    Iron deficiency    Feeling better.  Improving.  Eating less and losing weight.  Blood pressure is down.  Continues with B12 and iron supplementation.  Purchased cherry Lex[employer].   Review of Systems  Constitutional: Negative.   HENT: Negative.    Eyes:  Negative for blurred vision, discharge and redness.  Respiratory: Negative.    Cardiovascular: Negative.   Gastrointestinal:  Negative for abdominal pain.  Genitourinary: Negative.   Musculoskeletal: Negative.  Negative for myalgias.  Skin:  Negative for rash.  Neurological:  Negative for tingling, loss of consciousness and weakness.  Endo/Heme/Allergies:  Negative for polydipsia.     Current Outpatient Medications:    aspirin EC 81 MG tablet, Take 81 mg by mouth daily. Swallow whole., Disp: , Rfl:    atorvastatin (LIPITOR) 10 MG tablet, TAKE 1 TABLET(10 MG) BY MOUTH DAILY, Disp: 90 tablet, Rfl: 3   cyanocobalamin (VITAMIN B12) 1000 MCG tablet, Take 1 tablet (1,000 mcg total) by mouth daily., Disp: 90 tablet, Rfl: 1   Faricimab-svoa (VABYSMO IZ), by Intravitreal route every 8 (eight) weeks. One injection in left eye every 8 weeks., Disp: , Rfl:    Iron, Ferrous Sulfate, 325 (65 Fe) MG TABS, Take 1 tablet every other day., Disp: 45 tablet, Rfl: 1   losartan-hydrochlorothiazide (HYZAAR) 100-12.5 MG tablet, TAKE 1 TABLET BY MOUTH DAILY, Disp: 90 tablet, Rfl: 0   metFORMIN (GLUCOPHAGE-XR) 500 MG 24 hr tablet, Take 2 tablets (1,000 mg total) by mouth 2 (two) times daily with a meal., Disp: 360 tablet, Rfl: 1   Multiple Vitamins-Minerals (ICAPS  AREDS 2 PO), Take 1 capsule by mouth in the morning and at bedtime., Disp: , Rfl:    pantoprazole (PROTONIX) 40 MG tablet, Take 1 tablet (40 mg total) by mouth daily., Disp: 30 tablet, Rfl: 11   tamsulosin (FLOMAX) 0.4 MG CAPS capsule, Take 0.4 mg by mouth., Disp: , Rfl:    Objective:     BP 122/68 (BP Location: Right Arm, Patient Position: Sitting, Cuff Size: Normal)   Pulse 66   Temp 97.7 F (36.5 C) (Temporal)   Ht 5\' 8"  (1.727 m)   Wt 203 lb 9.6 oz (92.4 kg)   SpO2 97%   BMI 30.96 kg/m  Wt Readings from Last 3 Encounters:  09/11/22 203 lb 9.6 oz (92.4 kg)  07/16/22 207 lb 9.6 oz (94.2 kg)  07/02/22 210 lb (95.3 kg)      Physical Exam Constitutional:      General: He is not in acute distress.    Appearance: Normal appearance. He is not ill-appearing, toxic-appearing or diaphoretic.  HENT:     Head: Normocephalic and atraumatic.     Right Ear: External ear normal.     Left Ear: External ear normal.  Eyes:     General: No scleral icterus.       Right eye: No discharge.        Left eye: No discharge.     Extraocular Movements: Extraocular movements intact.  Conjunctiva/sclera: Conjunctivae normal.  Pulmonary:     Effort: Pulmonary effort is normal. No respiratory distress.  Skin:    General: Skin is warm and dry.  Neurological:     Mental Status: He is alert and oriented to person, place, and time.  Psychiatric:        Mood and Affect: Mood normal.        Behavior: Behavior normal.      Results for orders placed or performed in visit on 09/11/22  Basic metabolic panel  Result Value Ref Range   Sodium 133 (L) 135 - 145 mEq/L   Potassium 3.9 3.5 - 5.1 mEq/L   Chloride 97 96 - 112 mEq/L   CO2 25 19 - 32 mEq/L   Glucose, Bld 186 (H) 70 - 99 mg/dL   BUN 14 6 - 23 mg/dL   Creatinine, Ser 1.61 0.40 - 1.50 mg/dL   GFR 09.60 >45.40 mL/min   Calcium 9.1 8.4 - 10.5 mg/dL  Vitamin J81  Result Value Ref Range   Vitamin B-12 415 211 - 911 pg/mL  CBC  Result  Value Ref Range   WBC 8.5 4.0 - 10.5 K/uL   RBC 3.28 (L) 4.22 - 5.81 Mil/uL   Platelets 182.0 150.0 - 400.0 K/uL   Hemoglobin 11.7 (L) 13.0 - 17.0 g/dL   HCT 19.1 (L) 47.8 - 29.5 %   MCV 102.7 (H) 78.0 - 100.0 fl   MCHC 34.9 30.0 - 36.0 g/dL   RDW 62.1 30.8 - 65.7 %  Hemoglobin A1c  Result Value Ref Range   Hgb A1c MFr Bld 7.2 (H) 4.6 - 6.5 %  Iron, TIBC and Ferritin Panel  Result Value Ref Range   Iron 73 50 - 180 mcg/dL   TIBC 846 962 - 952 mcg/dL (calc)   %SAT 24 20 - 48 % (calc)   Ferritin 97 24 - 380 ng/mL      The ASCVD Risk score (Arnett DK, et al., 2019) failed to calculate for the following reasons:   The 2019 ASCVD risk score is only valid for ages 29 to 93    Assessment & Plan:   B12 deficiency -     Vitamin B12  Prediabetes -     Basic metabolic panel -     Hemoglobin A1c -     metFORMIN HCl ER; Take 2 tablets (1,000 mg total) by mouth 2 (two) times daily with a meal.  Dispense: 360 tablet; Refill: 1  Normocytic anemia -     CBC  Other fatigue  Iron deficiency -     Iron, TIBC and Ferritin Panel    Return in about 3 months (around 12/12/2022), or Continue weight loss efforts.  Continue supplementing with B12 and iron..  Will discontinue amlodipine.   Mliss Sax, MD  6/18 addendum: A1c has increased by a point.  Will increase metformin to 1000 mg twice daily.

## 2022-09-12 LAB — IRON,TIBC AND FERRITIN PANEL
%SAT: 24 % (calc) (ref 20–48)
Ferritin: 97 ng/mL (ref 24–380)
Iron: 73 ug/dL (ref 50–180)
TIBC: 309 mcg/dL (calc) (ref 250–425)

## 2022-09-15 DIAGNOSIS — D2239 Melanocytic nevi of other parts of face: Secondary | ICD-10-CM | POA: Diagnosis not present

## 2022-09-15 DIAGNOSIS — L820 Inflamed seborrheic keratosis: Secondary | ICD-10-CM | POA: Diagnosis not present

## 2022-09-15 DIAGNOSIS — D492 Neoplasm of unspecified behavior of bone, soft tissue, and skin: Secondary | ICD-10-CM | POA: Diagnosis not present

## 2022-09-15 DIAGNOSIS — L821 Other seborrheic keratosis: Secondary | ICD-10-CM | POA: Diagnosis not present

## 2022-09-15 DIAGNOSIS — L538 Other specified erythematous conditions: Secondary | ICD-10-CM | POA: Diagnosis not present

## 2022-09-15 DIAGNOSIS — R58 Hemorrhage, not elsewhere classified: Secondary | ICD-10-CM | POA: Diagnosis not present

## 2022-09-15 MED ORDER — METFORMIN HCL ER 500 MG PO TB24
1000.0000 mg | ORAL_TABLET | Freq: Two times a day (BID) | ORAL | 1 refills | Status: DC
Start: 2022-09-15 — End: 2023-10-26

## 2022-09-15 NOTE — Addendum Note (Signed)
Addended by: Nadene Rubins A on: 09/15/2022 11:20 AM   Modules accepted: Orders

## 2022-09-23 ENCOUNTER — Telehealth: Payer: Self-pay

## 2022-09-23 DIAGNOSIS — R7303 Prediabetes: Secondary | ICD-10-CM

## 2022-09-23 DIAGNOSIS — H353221 Exudative age-related macular degeneration, left eye, with active choroidal neovascularization: Secondary | ICD-10-CM | POA: Diagnosis not present

## 2022-09-23 NOTE — Telephone Encounter (Signed)
Message can wait for Dr. Doreene Burke. Per patient he was taken off Glimepiride patient asking if he should start back on medication with Metformin to help control diabetes? Please advise.

## 2022-09-24 NOTE — Telephone Encounter (Signed)
Patient states that he have been taking Metformin as directed would like something else sent in to help control Diabetes.

## 2022-09-25 MED ORDER — GLIMEPIRIDE 2 MG PO TABS
2.0000 mg | ORAL_TABLET | Freq: Every day | ORAL | 3 refills | Status: DC
Start: 2022-09-25 — End: 2023-01-28

## 2022-09-25 NOTE — Telephone Encounter (Signed)
Please advise message below  °

## 2022-09-25 NOTE — Telephone Encounter (Signed)
Patient aware Rx sent in  

## 2022-10-02 NOTE — Progress Notes (Unsigned)
Cardiology Office Note:    Date:  10/06/2022   ID:  Steven Yates, Steven Yates [birth year], MRN [id number]  PCP:  Mliss Sax, MD  Cardiologist:  Lesleigh Noe, MD (Inactive)  Electrophysiologist:  None   Referring MD: Mliss Sax,*   Chief Complaint  Patient presents with   Coronary Artery Disease    History of Present Illness:    Steven Yates is a 82 y.o. male seen for evaluation of SOB. He has a hx of CAD status post CTO PCI to LAD with DES x 2 in 2016, hypertension, T2DM, hyperlipidemia. He is a former patient of Dr. Katrinka Blazing.    Most recent cath 09/01/2016 showed mild to moderate in-stent restenosis in LAD stent, mild nonobstructive disease in LCx and RCA.  He was seen in Jan by Dr Bjorn Pippin - noted dyspnea at that time. Echo was normal and Myoview was low risk. Dr Bjorn Pippin reviewed and did not see evidence of ischemia. Significantly improved from 2015.   He reports today his breathing is doing much better. He is still working from home in [employer]. Works some in his yard. Travels when he wants. No chest pain. Overall feels well.     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 Katrinka Blazing   CARDIAC CATHETERIZATION  04/04/2014   Procedure: CORONARY/BYPASS GRAFT CTO INTERVENTION;  Surgeon: Vallarie Fei M Swaziland, MD;  Location: [facility] CATH LAB;  Service: Cardiovascular;;   CARPAL TUNNEL RELEASE     bil   2019   CHOLECYSTECTOMY  2010   CORONARY ANGIOPLASTY WITH STENT PLACEMENT  04/04/2014   "2"   CORONARY PRESSURE/FFR STUDY N/A 09/01/2016   Procedure: Intravascular  Pressure Wire/FFR Study;  Surgeon: Lyn Records, MD;  Location: [facility] INVASIVE CV LAB;  Service: Cardiovascular;  Laterality: N/A;   INGUINAL HERNIA REPAIR Bilateral 1966?   LEFT HEART CATH AND CORONARY ANGIOGRAPHY N/A 09/01/2016   Procedure: Left Heart Cath and Coronary Angiography;  Surgeon: Lyn Records, MD;  Location: [facility] INVASIVE CV LAB;  Service: Cardiovascular;  Laterality: N/A;   LEFT HEART CATHETERIZATION WITH CORONARY ANGIOGRAM N/A 01/30/2014   Procedure: LEFT HEART CATHETERIZATION WITH CORONARY ANGIOGRAM;  Surgeon: Lesleigh Noe, MD;  Location: [facility] CATH LAB;  Service: Cardiovascular;  Laterality: N/A;   LUMBAR DISC SURGERY  1980's X 2   "ruptured disc"   PERCUTANEOUS CORONARY STENT INTERVENTION (PCI-S)  01/30/2014   Procedure: PERCUTANEOUS CORONARY STENT INTERVENTION (PCI-S);  Surgeon: Lesleigh Noe, MD;  Location: [facility] CATH LAB;  Service: Cardiovascular;;   SHOULDER OPEN ROTATOR CUFF REPAIR Right 1990's   TOTAL KNEE ARTHROPLASTY Left 06/06/2018   Procedure: TOTAL KNEE ARTHROPLASTY;  Surgeon: Ollen Gross, MD;  Location: [facility] ORS;  Service: Orthopedics;  Laterality: Left;    ULTRASOUND GUIDANCE FOR VASCULAR ACCESS  09/01/2016   Procedure: Ultrasound Guidance For Vascular Access;  Surgeon: Lyn Records, MD;  Location: [facility] INVASIVE CV LAB;  Service: Cardiovascular;;    Current Medications: Current Meds  Medication Sig   aspirin EC 81 MG tablet Take 81 mg by mouth daily. Swallow whole.   atorvastatin (LIPITOR) 10 MG tablet TAKE 1 TABLET(10 MG) BY MOUTH DAILY   cyanocobalamin (VITAMIN B12) 1000 MCG tablet Take 1 tablet (1,000 mcg total) by mouth daily.   Faricimab-svoa (VABYSMO IZ) by Intravitreal route every 8 (eight) weeks. One injection in left eye every 8 weeks.   glimepiride (AMARYL) 2 MG tablet Take 1 tablet (2 mg total) by mouth daily before breakfast.   Iron, Ferrous Sulfate, 325 (65 Fe) MG TABS Take 1 tablet every other day.   losartan-hydrochlorothiazide (HYZAAR)  100-12.5 MG tablet TAKE 1 TABLET BY MOUTH DAILY   metFORMIN (GLUCOPHAGE-XR) 500 MG 24 hr tablet Take 2 tablets (1,000 mg total) by mouth 2 (two) times daily with a meal.   Multiple Vitamins-Minerals (ICAPS AREDS 2 PO) Take 1 capsule by mouth in the morning and at bedtime.   pantoprazole (PROTONIX) 40 MG tablet Take 1 tablet (40 mg total) by mouth daily.   tamsulosin (FLOMAX) 0.4 MG CAPS capsule Take 0.4 mg by mouth.     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  Tobacco Use   Smoking status: Former    Types: Cigars    Quit date: 04/29/2018    Years since quitting: 4.4   Smokeless tobacco: Never  Vaping Use   Vaping Use: Never used  Substance and Sexual Activity   Alcohol use: No    Alcohol/week: 0.0 standard drinks of alcohol   Drug use: No   Sexual activity: Yes  Other Topics Concern   Not on file  Social History Narrative   Not on file   Social Determinants of Health   Financial Resource Strain: Not on file  Food Insecurity: Not on file  Transportation Needs: Not on file  Physical Activity: Not on file  Stress: Not on file  Social Connections: Not on file     Family History: The patient's family history includes Cancer in his brother; Emphysema in his father; Heart disease in his brother and mother; Other in his sister.  ROS:   Please see the history of present illness.     All other systems reviewed and are negative.  EKGs/Labs/Other Studies Reviewed:    The following studies were reviewed today:  Echo 04/24/22: IMPRESSIONS     1. Left ventricular ejection fraction, by estimation, is 60 to 65%. The  left ventricle has normal function. The left ventricle has no regional  wall motion abnormalities. Left ventricular diastolic  parameters are  consistent with Grade I diastolic  dysfunction (impaired relaxation).   2. Right ventricular systolic function is normal. The right ventricular  size is normal. There is normal pulmonary artery systolic pressure.   3. Left atrial size was mildly dilated.   4. The mitral valve is normal in structure. Trivial mitral valve  regurgitation. No evidence of mitral stenosis.   5. The aortic valve is tricuspid. Aortic valve regurgitation is not  visualized. No aortic stenosis is present.   6. The inferior vena cava is normal in size with greater than 50%  respiratory variability, suggesting right atrial pressure of 3 mmHg.   Myoview 04/24/22: Study Highlights  Findings are consistent with infarction with peri-infarct ischemia. The study is intermediate risk.   No ST deviation was noted.   Left ventricular function is normal. Nuclear stress EF: 56 %. The left ventricular ejection fraction is normal (55-65%). End diastolic cavity size is normal.   Prior study available for comparison from 01/29/2014.   small size and mild intensity apical/apical anterior fixed lesion c/w possible scar with peri-infarct ischemia.  Prior nuclear study was low risk, do not have the images to compare.     EKG Interpretation Date/Time:  Tuesday October 06 2022 15:15:22 EDT Ventricular Rate:  55 PR Interval:  146 QRS Duration:  98 QT Interval:  446 QTC Calculation: 426 R Axis:   -18  Text Interpretation: Sinus bradycardia Anteroseptal infarct (cited on or before 04-Apr-2014) When compared with ECG of 05-Apr-2014 06:23, No significant change was found Confirmed by [place], Paquita Printy [phone]) on 10/06/2022 3:23:24 PM     Recent Labs: 01/21/2022: ALT 22 06/09/2022: TSH 1.29 09/11/2022: BUN 14; Creatinine, Ser 1.01; Hemoglobin 11.7; Platelets 182.0; Potassium 3.9; Sodium 133  Recent Lipid Panel    Component Value Date/Time   CHOL 104 01/21/2022 0856   TRIG 116.0 01/21/2022 0856   HDL 34.70 (L)  01/21/2022 5188   CHOLHDL 3 01/21/2022 0856   VLDL 23.2 01/21/2022 0856   LDLCALC 46 01/21/2022 0856    Physical Exam:    VS:  BP (!) 120/58   Pulse (!) 55   Ht 5\' 9"  (1.753 m)   Wt 203 lb 3.2 oz (92.2 kg)   SpO2 95%   BMI 30.01 kg/m     Wt Readings from Last 3 Encounters:  10/06/22 203 lb 3.2 oz (92.2 kg)  09/11/22 203 lb 9.6 oz (92.4 kg)  07/16/22 207 lb 9.6 oz (94.2 kg)     GEN:  Well nourished, well developed in no acute distress HEENT: Normal NECK: No JVD; No carotid bruits LYMPHATICS: No lymphadenopathy CARDIAC: RRR, no murmurs, rubs, gallops RESPIRATORY:  Clear to auscultation without rales, wheezing or rhonchi  ABDOMEN: Soft, non-tender, non-distended MUSCULOSKELETAL:  No edema; No deformity  SKIN: Warm and dry NEUROLOGIC:  Alert and oriented x 3 PSYCHIATRIC:  Normal affect   ASSESSMENT:    1. Coronary artery disease involving native coronary artery of native heart, unspecified whether angina present   2. Hyperlipidemia, unspecified hyperlipidemia type   3. Essential hypertension     PLAN:    Dyspnea: He did report dyspnea with minimal exertion.  Fortunately this has improved significantly. Myoview and Echo looked good. Continue medical therapy  2.   CAD:  status post CTO PCI to LAD with DES x 2 in 2016.  Most recent cath 09/01/2016 showed mild to moderate in-stent restenosis in LAD stent, mild nonobstructive disease in LCx and RCA - recent myoview with small scar minimal ischemia. Low risk -Continue aspirin, statin  3.   Hypertension: On losartan-HCTZ 100-12.5 mg daily and amlodipine 5 mg daily.  Appears controlled  4.   Hyperlipidemia: On atorvastatin 10 mg daily.  LDL 46 on 01/21/2022   Follow-up in 6 months.  Orders Placed This Encounter  Procedures   EKG 12-Lead   No orders of the defined types were placed in this encounter.   There are no Patient Instructions on file for this visit.   Signed, Kayleena Eke [place], MD  10/06/2022 3:37 PM    [facility]

## 2022-10-03 ENCOUNTER — Other Ambulatory Visit: Payer: Self-pay | Admitting: Family Medicine

## 2022-10-03 DIAGNOSIS — I10 Essential (primary) hypertension: Secondary | ICD-10-CM

## 2022-10-06 ENCOUNTER — Ambulatory Visit: Payer: PPO | Attending: Cardiology | Admitting: Cardiology

## 2022-10-06 ENCOUNTER — Encounter: Payer: Self-pay | Admitting: Cardiology

## 2022-10-06 VITALS — BP 120/58 | HR 55 | Ht 69.0 in | Wt 203.2 lb

## 2022-10-06 DIAGNOSIS — E785 Hyperlipidemia, unspecified: Secondary | ICD-10-CM | POA: Diagnosis not present

## 2022-10-06 DIAGNOSIS — I10 Essential (primary) hypertension: Secondary | ICD-10-CM | POA: Diagnosis not present

## 2022-10-06 DIAGNOSIS — I2510 Atherosclerotic heart disease of native coronary artery without angina pectoris: Secondary | ICD-10-CM

## 2022-10-06 NOTE — Patient Instructions (Signed)
Medication Instructions:  Continue same medications *If you need a refill on your cardiac medications before your next appointment, please call your pharmacy*   Lab Work: None ordered   Testing/Procedures: None ordered   Follow-Up: At [facility], you and your health needs are our priority.  As part of our continuing mission to provide you with exceptional heart care, we have created designated Provider Care Teams.  These Care Teams include your primary Cardiologist (physician) and Advanced Practice Providers (APPs -  Physician Assistants and Nurse Practitioners) who all work together to provide you with the care you need, when you need it.  We recommend signing up for the patient portal called "MyChart".  Sign up information is provided on this After Visit Summary.  MyChart is used to connect with patients for Virtual Visits (Telemedicine).  Patients are able to view lab/test results, encounter notes, upcoming appointments, etc.  Non-urgent messages can be sent to your provider as well.   To learn more about what you can do with MyChart, go to [email or web address].    Your next appointment:  6 months    Call in Sept to schedule Jan appointment     Provider:  Dr.Jordan   

## 2022-10-19 DIAGNOSIS — D492 Neoplasm of unspecified behavior of bone, soft tissue, and skin: Secondary | ICD-10-CM | POA: Diagnosis not present

## 2022-10-19 DIAGNOSIS — C44319 Basal cell carcinoma of skin of other parts of face: Secondary | ICD-10-CM | POA: Diagnosis not present

## 2022-10-30 ENCOUNTER — Telehealth: Payer: Self-pay | Admitting: Family Medicine

## 2022-10-30 NOTE — Telephone Encounter (Signed)
Pt wife said pt takes metformin but keeps having diarrhea

## 2022-11-02 ENCOUNTER — Telehealth: Payer: Self-pay | Admitting: Family Medicine

## 2022-11-02 ENCOUNTER — Ambulatory Visit: Payer: PPO | Admitting: Family Medicine

## 2022-11-02 NOTE — Telephone Encounter (Signed)
error 

## 2022-11-02 NOTE — Telephone Encounter (Signed)
8.5.24 no show letter sent

## 2022-11-05 NOTE — Telephone Encounter (Signed)
Pt called same day stating he was feeling better. Did not mark as no show.

## 2022-11-06 DIAGNOSIS — C44319 Basal cell carcinoma of skin of other parts of face: Secondary | ICD-10-CM | POA: Diagnosis not present

## 2022-11-06 DIAGNOSIS — L820 Inflamed seborrheic keratosis: Secondary | ICD-10-CM | POA: Diagnosis not present

## 2022-11-06 DIAGNOSIS — B078 Other viral warts: Secondary | ICD-10-CM | POA: Diagnosis not present

## 2022-11-12 ENCOUNTER — Encounter (INDEPENDENT_AMBULATORY_CARE_PROVIDER_SITE_OTHER): Payer: Self-pay

## 2022-11-18 DIAGNOSIS — H353221 Exudative age-related macular degeneration, left eye, with active choroidal neovascularization: Secondary | ICD-10-CM | POA: Diagnosis not present

## 2022-12-14 ENCOUNTER — Ambulatory Visit (INDEPENDENT_AMBULATORY_CARE_PROVIDER_SITE_OTHER): Payer: PPO | Admitting: Family Medicine

## 2022-12-14 ENCOUNTER — Encounter: Payer: Self-pay | Admitting: Family Medicine

## 2022-12-14 VITALS — BP 132/82 | HR 67 | Temp 97.7°F | Ht 69.0 in | Wt 208.4 lb

## 2022-12-14 DIAGNOSIS — D649 Anemia, unspecified: Secondary | ICD-10-CM

## 2022-12-14 DIAGNOSIS — E782 Mixed hyperlipidemia: Secondary | ICD-10-CM | POA: Diagnosis not present

## 2022-12-14 DIAGNOSIS — R7303 Prediabetes: Secondary | ICD-10-CM | POA: Diagnosis not present

## 2022-12-14 DIAGNOSIS — I10 Essential (primary) hypertension: Secondary | ICD-10-CM

## 2022-12-14 DIAGNOSIS — Z23 Encounter for immunization: Secondary | ICD-10-CM

## 2022-12-14 DIAGNOSIS — E871 Hypo-osmolality and hyponatremia: Secondary | ICD-10-CM | POA: Diagnosis not present

## 2022-12-14 LAB — CBC
HCT: 35.4 % — ABNORMAL LOW (ref 39.0–52.0)
Hemoglobin: 12.1 g/dL — ABNORMAL LOW (ref 13.0–17.0)
MCHC: 34.2 g/dL (ref 30.0–36.0)
MCV: 102.8 fl — ABNORMAL HIGH (ref 78.0–100.0)
Platelets: 168 10*3/uL (ref 150.0–400.0)
RBC: 3.44 Mil/uL — ABNORMAL LOW (ref 4.22–5.81)
RDW: 15.2 % (ref 11.5–15.5)
WBC: 6.5 10*3/uL (ref 4.0–10.5)

## 2022-12-14 LAB — BASIC METABOLIC PANEL
BUN: 12 mg/dL (ref 6–23)
CO2: 26 meq/L (ref 19–32)
Calcium: 8.8 mg/dL (ref 8.4–10.5)
Chloride: 97 meq/L (ref 96–112)
Creatinine, Ser: 0.95 mg/dL (ref 0.40–1.50)
GFR: 75.66 mL/min (ref >60.00)
Glucose, Bld: 128 mg/dL — ABNORMAL HIGH (ref 70–99)
Potassium: 4.1 meq/L (ref 3.5–5.1)
Sodium: 131 meq/L — ABNORMAL LOW (ref 135–145)

## 2022-12-14 LAB — HEMOGLOBIN A1C: Hgb A1c MFr Bld: 5.8 % (ref 4.6–6.5)

## 2022-12-14 LAB — LDL CHOLESTEROL, DIRECT: Direct LDL: 54 mg/dL

## 2022-12-14 LAB — VITAMIN B12: Vitamin B-12: 482 pg/mL (ref 211–911)

## 2022-12-14 NOTE — Progress Notes (Signed)
Established Patient Office Visit   Subjective:  Patient ID: Steven Yates. Karges, male    DOB: June 10, 1940  Age: 82 y.o. MRN: [id number]  Chief Complaint  Patient presents with   Medical Management of Chronic Issues    Follow up. Pt is fasting.     HPI Encounter Diagnoses  Name Primary?   Prediabetes Yes   Normocytic anemia    Essential hypertension    Hyponatremia    Mixed hyperlipidemia    Immunization due    Follow-up of above.  No issues taking the glimepiride.   Review of Systems  Constitutional: Negative.   HENT: Negative.    Eyes:  Negative for blurred vision, discharge and redness.  Respiratory: Negative.    Cardiovascular: Negative.   Gastrointestinal:  Negative for abdominal pain.  Genitourinary: Negative.   Musculoskeletal: Negative.  Negative for myalgias.  Skin:  Negative for rash.  Neurological:  Negative for tingling, loss of consciousness and weakness.  Endo/Heme/Allergies:  Negative for polydipsia.     Current Outpatient Medications:    aspirin EC 81 MG tablet, Take 81 mg by mouth daily. Swallow whole., Disp: , Rfl:    atorvastatin (LIPITOR) 10 MG tablet, TAKE 1 TABLET(10 MG) BY MOUTH DAILY, Disp: 90 tablet, Rfl: 3   cyanocobalamin (VITAMIN B12) 1000 MCG tablet, Take 1 tablet (1,000 mcg total) by mouth daily., Disp: 90 tablet, Rfl: 1   Faricimab-svoa (VABYSMO IZ), by Intravitreal route every 8 (eight) weeks. One injection in left eye every 8 weeks., Disp: , Rfl:    glimepiride (AMARYL) 2 MG tablet, Take 1 tablet (2 mg total) by mouth daily before breakfast., Disp: 30 tablet, Rfl: 3   Iron, Ferrous Sulfate, 325 (65 Fe) MG TABS, Take 1 tablet every other day., Disp: 45 tablet, Rfl: 1   losartan-hydrochlorothiazide (HYZAAR) 100-12.5 MG tablet, TAKE 1 TABLET BY MOUTH DAILY, Disp: 90 tablet, Rfl: 0   metFORMIN (GLUCOPHAGE-XR) 500 MG 24 hr tablet, Take 2 tablets (1,000 mg total) by mouth 2 (two) times daily with a meal., Disp: 360 tablet, Rfl: 1   Multiple  Vitamins-Minerals (ICAPS AREDS 2 PO), Take 1 capsule by mouth in the morning and at bedtime., Disp: , Rfl:    pantoprazole (PROTONIX) 40 MG tablet, Take 1 tablet (40 mg total) by mouth daily., Disp: 30 tablet, Rfl: 11   tamsulosin (FLOMAX) 0.4 MG CAPS capsule, Take 0.4 mg by mouth., Disp: , Rfl:    Objective:     BP 132/82   Pulse 67   Temp 97.7 F (36.5 C)   Ht 5\' 9"  (1.753 m)   Wt 208 lb 6.4 oz (94.5 kg)   SpO2 97%   BMI 30.78 kg/m  Wt Readings from Last 3 Encounters:  12/14/22 208 lb 6.4 oz (94.5 kg)  10/06/22 203 lb 3.2 oz (92.2 kg)  09/11/22 203 lb 9.6 oz (92.4 kg)      Physical Exam Constitutional:      General: He is not in acute distress.    Appearance: Normal appearance. He is not ill-appearing, toxic-appearing or diaphoretic.  HENT:     Head: Normocephalic and atraumatic.     Right Ear: External ear normal.     Left Ear: External ear normal.  Eyes:     General: No scleral icterus.       Right eye: No discharge.        Left eye: No discharge.     Extraocular Movements: Extraocular movements intact.     Conjunctiva/sclera: Conjunctivae normal.  Pulmonary:     Effort: Pulmonary effort is normal. No respiratory distress.  Skin:    General: Skin is warm and dry.  Neurological:     Mental Status: He is alert and oriented to person, place, and time.  Psychiatric:        Mood and Affect: Mood normal.        Behavior: Behavior normal.      No results found for any visits on 12/14/22.    The ASCVD Risk score (Arnett DK, et al., 2019) failed to calculate for the following reasons:   The 2019 ASCVD risk score is only valid for ages 36 to 85    Assessment & Plan:   Prediabetes -     Basic metabolic panel -     Hemoglobin A1c  Normocytic anemia -     Vitamin B12 -     CBC  Essential hypertension -     Basic metabolic panel  Hyponatremia -     Basic metabolic panel  Mixed hyperlipidemia -     LDL cholesterol, direct  Immunization due -      Pneumococcal conjugate vaccine 20-valent    Return in about 3 months (around 03/15/2023), or if symptoms worsen or fail to improve.  Follow-up on A1c status post adding glimepiride to metformin.  Follow-up on hyperlipidemia.  Follow-up on hyponatremia.  Discussed discontinuation of HCTZ if sodium remains low.  Rechecking CBC and B12 levels.  Mliss Sax, MD

## 2022-12-15 MED ORDER — LOSARTAN POTASSIUM 100 MG PO TABS
100.0000 mg | ORAL_TABLET | Freq: Every day | ORAL | 2 refills | Status: DC
Start: 2022-12-15 — End: 2023-04-08

## 2022-12-15 NOTE — Addendum Note (Signed)
Addended by: Andrez Grime on: 12/15/2022 03:25 PM   Modules accepted: Orders

## 2022-12-17 ENCOUNTER — Other Ambulatory Visit: Payer: Self-pay

## 2022-12-17 DIAGNOSIS — E7800 Pure hypercholesterolemia, unspecified: Secondary | ICD-10-CM

## 2022-12-17 MED ORDER — ATORVASTATIN CALCIUM 10 MG PO TABS
ORAL_TABLET | ORAL | 3 refills | Status: DC
Start: 2022-12-17 — End: 2023-01-19

## 2023-01-01 ENCOUNTER — Ambulatory Visit: Payer: PPO | Admitting: Cardiology

## 2023-01-06 ENCOUNTER — Telehealth: Payer: Self-pay | Admitting: Cardiology

## 2023-01-06 NOTE — Telephone Encounter (Signed)
Pt c/o Shortness Of Breath: STAT if SOB developed within the last 24 hours or pt is noticeably SOB on the phone  1. Are you currently SOB (can you hear that pt is SOB on the phone)? yes 2. How long have you been experiencing SOB?  3 or 4 days 3. Are you SOB when sitting or when up moving around? When he moves around or does anything 4.  Are you currently experiencing any other symptoms?  Headache off and on

## 2023-01-06 NOTE — Telephone Encounter (Signed)
Patient C/I SOB since weekend. He states can walk around but if any exertion at all then he is winded.  No chest pain at this time.  He states once this week woke up with H/A and slight chest pain, not tight just uncomfortable.  He is SOB walking to his car but resolves with rest.  He is set for DOD for tomorrow.  ED precautions given.

## 2023-01-07 ENCOUNTER — Encounter: Payer: Self-pay | Admitting: Internal Medicine

## 2023-01-07 ENCOUNTER — Ambulatory Visit: Payer: PPO | Attending: Internal Medicine | Admitting: Internal Medicine

## 2023-01-07 VITALS — BP 150/74 | HR 56 | Ht 71.0 in | Wt 208.8 lb

## 2023-01-07 DIAGNOSIS — I2510 Atherosclerotic heart disease of native coronary artery without angina pectoris: Secondary | ICD-10-CM

## 2023-01-07 DIAGNOSIS — Z01812 Encounter for preprocedural laboratory examination: Secondary | ICD-10-CM | POA: Diagnosis not present

## 2023-01-07 MED ORDER — FUROSEMIDE 20 MG PO TABS: 20.0000 mg | ORAL_TABLET | Freq: Every day | ORAL | 3 refills | Status: DC

## 2023-01-07 MED ORDER — ISOSORBIDE MONONITRATE ER 30 MG PO TB24: 30.0000 mg | ORAL_TABLET | Freq: Every day | ORAL | 3 refills | Status: DC

## 2023-01-07 NOTE — H&P (View-Only) (Signed)
  Cardiology Office Note:  .   Date:  01/07/2023  ID:  Steven Yates. Steven, Yates [birth year], MRN [id number] PCP: Mliss Sax, MD  [facility] Providers Cardiologist:  Lesleigh Noe, MD (Inactive)    History of Present Illness: .   Steven Yates is a 82 y.o. male He has a hx of CAD status post CTO PCI to LAD with DES x 2 in 2016, hypertension, T2DM, hyperlipidemia. He is a former patient of Dr. Katrinka Blazing and now sees Dr. Swaziland. He's been seen in Jan of this year by Dr. Bjorn Pippin for dyspnea. Echo was unremarkable and Lexiscan SPECT.  An area of possible scar apically as well as possible peri-infarct ischemia however Dr. Bjorn Pippin felt it was very mild. He can walk to the parking lot from here, but any further can get some SOB. He notes increased fatigue with carrying groceries. He notes shoulder pain. He has increased indigestion.  He notes sleeping in a chair 2-3 hours per night. He is not on a diuretic. His ankles have been swelling. His wife is concerned about progressive coronary disease.  ROS:  per HPI otherwise negative   Studies Reviewed: .        Per above Risk Assessment/Calculations:    Physical Exam:   VS:   Vitals:   01/07/23 1429  BP: (!) 150/74  Pulse: (!) 56  SpO2: 98%    Wt Readings from Last 3 Encounters:  12/14/22 208 lb 6.4 oz (94.5 kg)  10/06/22 203 lb 3.2 oz (92.2 kg)  09/11/22 203 lb 9.6 oz (92.4 kg)    GEN: Well nourished, well developed in no acute distress NECK: No sig JVD CARDIAC: RRR, no murmurs, rubs, gallops RESPIRATORY:  Clear to auscultation without rales, wheezing or rhonchi  ABDOMEN: Soft, non-tender, non-distended EXTREMITIES:  trace LE edema; No deformity   ASSESSMENT AND PLAN: .   Chronic dyspnea He reports that his dyspnea has progressed since January he also reports symptoms of orthopnea.  He is also endorsing lower extremity edema.  His lungs are clear.  may have mild decompensation.  Considering his symptoms have  progressed, I will opt to repeat his left heart cath. --> BNP, lasix 20 mg daily, LHC  CAD post CTO PCI to LAD with DES x 2 in 2016 LHC per above ---> start imdur 30 mg daily - no changes with GDMT  HTN - elevated today, starting imdur - continue current therapy      Dispo: Continue to follow with Dr. Swaziland  Signed, Clarabelle Oscarson, Alben Spittle, MD

## 2023-01-07 NOTE — Patient Instructions (Addendum)
Medication Instructions:  - Start furosemide (Lasix) 20mg , once daily    *If you need a refill on your cardiac medications before your next appointment, please call your pharmacy*   Lab Work: - Brain Natriuretic Peptide  - CBC -BMP   If you have labs (blood work) drawn today and your tests are completely normal, you will receive your results only by: MyChart Message (if you have MyChart) OR A paper copy in the mail If you have any lab test that is abnormal or we need to change your treatment, we will call you to review the results.   Testing/Procedures:  [place] A DEPT OF [place]. [facility] AT [facility] [address] [place] [address] [place] [address] Dept: [phone] Loc: [phone]  Beverely Pace. Waggle  01/07/2023  You are scheduled for a Cardiac Catheterization on Tuesday, October 22 with Dr. Peter [place].  1. Please arrive at the [facility] (Main Entrance A) at [facility]: [address] [place], [place] [address] at 8:30 AM (This time is 2 hour(s) before your procedure to ensure your preparation). Free valet parking service is available. You will check in at ADMITTING. The support person will be asked to wait in the waiting room.  It is OK to have someone drop you off and come back when you are ready to be discharged.    Special note: Every effort is made to have your procedure done on time. Please understand that emergencies sometimes delay scheduled procedures.  2. Diet: Do not eat solid foods after midnight.  The patient may have clear liquids until 5am upon the day of the procedure.  3. Labs: You will need to have blood drawn on Thursday, October 10 at [facility] [address], [place]  Open: 8am - 5pm (Lunch 12:30 - 1:30)   Phone: [phone]. You do not need to be fasting.  4. Medication instructions in preparation for your procedure:   Contrast Allergy: No    Stop taking, on  Sunday, October 21 Cozaar (Losartan) Lasix (Furosemide)     Do not take Diabetes Med Glucophage (Metformin) on the day of the procedure and HOLD 48 HOURS AFTER THE PROCEDURE. Stop taking Glimipride (AMARYL)   On the morning of your procedure, take your Aspirin 81 mg and any morning medicines NOT listed above.  You may use sips of water.  5. Plan to go home the same day, you will only stay overnight if medically necessary. 6. Bring a current list of your medications and current insurance cards. 7. You MUST have a responsible person to drive you home. 8. Someone MUST be with you the first 24 hours after you arrive home or your discharge will be delayed. 9. Please wear clothes that are easy to get on and off and wear slip-on shoes.  Thank you for allowing [place] to care for you!   -- [facility]    Follow-Up: At [facility], you and your health needs are our priority.  As part of our continuing mission to provide you with exceptional heart care, we have created designated Provider Care Teams.  These Care Teams include your primary Cardiologist (physician) and Advanced Practice Providers (APPs -  Physician Assistants and Nurse Practitioners) who all work together to provide you with the care you need, when you need it.  We recommend signing up for the patient portal called "MyChart".  Sign up information is provided on this After Visit  Summary.  MyChart is used to connect with patients for Virtual Visits (Telemedicine).  Patients are able to view lab/test results, encounter notes, upcoming appointments, etc.  Non-urgent messages can be sent to your provider as well.   To learn more about what you can do with MyChart, go to [email or web address].    Your next appointment:   1 month(s)  The format for your next appointment:   In Person  Provider:   Peter [place], MD    Other Instructions

## 2023-01-07 NOTE — Progress Notes (Signed)
  Cardiology Office Note:  .   Date:  01/07/2023  ID:  Steven Yates, Steven Yates [birth year], MRN [id number] PCP: Mliss Sax, MD  [facility] Providers Cardiologist:  Lesleigh Noe, MD (Inactive)    History of Present Illness: .   Steven Yates is a 82 y.o. male He has a hx of CAD status post CTO PCI to LAD with DES x 2 in 2016, hypertension, T2DM, hyperlipidemia. He is a former patient of Dr. Katrinka Blazing and now sees Dr. Swaziland. He's been seen in Jan of this year by Dr. Bjorn Pippin for dyspnea. Echo was unremarkable and Lexiscan SPECT.  An area of possible scar apically as well as possible peri-infarct ischemia however Dr. Bjorn Pippin felt it was very mild. He can walk to the parking lot from here, but any further can get some SOB. He notes increased fatigue with carrying groceries. He notes shoulder pain. He has increased indigestion.  He notes sleeping in a chair 2-3 hours per night. He is not on a diuretic. His ankles have been swelling. His wife is concerned about progressive coronary disease.  ROS:  per HPI otherwise negative   Studies Reviewed: .        Per above Risk Assessment/Calculations:    Physical Exam:   VS:   Vitals:   01/07/23 1429  BP: (!) 150/74  Pulse: (!) 56  SpO2: 98%    Wt Readings from Last 3 Encounters:  12/14/22 208 lb 6.4 oz (94.5 kg)  10/06/22 203 lb 3.2 oz (92.2 kg)  09/11/22 203 lb 9.6 oz (92.4 kg)    GEN: Well nourished, well developed in no acute distress NECK: No sig JVD CARDIAC: RRR, no murmurs, rubs, gallops RESPIRATORY:  Clear to auscultation without rales, wheezing or rhonchi  ABDOMEN: Soft, non-tender, non-distended EXTREMITIES:  trace LE edema; No deformity   ASSESSMENT AND PLAN: .   Chronic dyspnea He reports that his dyspnea has progressed since January he also reports symptoms of orthopnea.  He is also endorsing lower extremity edema.  His lungs are clear.  may have mild decompensation.  Considering his symptoms have  progressed, I will opt to repeat his left heart cath. --> BNP, lasix 20 mg daily, LHC  CAD post CTO PCI to LAD with DES x 2 in 2016 LHC per above ---> start imdur 30 mg daily - no changes with GDMT  HTN - elevated today, starting imdur - continue current therapy      Dispo: Continue to follow with Dr. Swaziland  Signed, Clarabelle Oscarson, Alben Spittle, MD

## 2023-01-08 LAB — BRAIN NATRIURETIC PEPTIDE: BNP: 80.5 pg/mL (ref 0.0–100.0)

## 2023-01-08 LAB — BASIC METABOLIC PANEL
BUN/Creatinine Ratio: 12 (ref 10–24)
BUN: 13 mg/dL (ref 8–27)
CO2: 22 mmol/L (ref 20–29)
Calcium: 9.3 mg/dL (ref 8.6–10.2)
Chloride: 102 mmol/L (ref 96–106)
Creatinine, Ser: 1.06 mg/dL (ref 0.76–1.27)
Glucose: 82 mg/dL (ref 70–99)
Potassium: 4.7 mmol/L (ref 3.5–5.2)
Sodium: 140 mmol/L (ref 134–144)
eGFR: 71 mL/min/{1.73_m2} (ref >59)

## 2023-01-08 LAB — CBC
Hematocrit: 32.9 % — ABNORMAL LOW (ref 37.5–51.0)
Hemoglobin: 11.3 g/dL — ABNORMAL LOW (ref 13.0–17.7)
MCH: 35.6 pg — ABNORMAL HIGH (ref 26.6–33.0)
MCHC: 34.3 g/dL (ref 31.5–35.7)
MCV: 104 fL — ABNORMAL HIGH (ref 79–97)
Platelets: 147 10*3/uL — ABNORMAL LOW (ref 150–450)
RBC: 3.17 x10E6/uL — ABNORMAL LOW (ref 4.14–5.80)
RDW: 14.1 % (ref 11.6–15.4)
WBC: 8.2 10*3/uL (ref 3.4–10.8)

## 2023-01-13 DIAGNOSIS — H353221 Exudative age-related macular degeneration, left eye, with active choroidal neovascularization: Secondary | ICD-10-CM | POA: Diagnosis not present

## 2023-01-18 ENCOUNTER — Telehealth: Payer: Self-pay | Admitting: *Deleted

## 2023-01-18 NOTE — Telephone Encounter (Signed)
Cardiac Catheterization scheduled at [facility] for: Tuesday January 19, 2023 10:30 AM Arrival time [facility] Main Entrance A at: 8:30 AM  Nothing to eat after midnight prior to procedure, clear liquids until 5 AM day of procedure.  Medication instructions: -Hold:  Metformin-day of procedure and 48 hours post procedure  Amaryl/Lasix -AM of procedure -Other usual morning medications can be taken with sips of water including aspirin 81 mg.  Plan to go home the same day, you will only stay overnight if medically necessary.  You must have responsible adult to drive you home.  Someone must be with you the first 24 hours after you arrive home.  Reviewed procedure instructions with patient.  Patient reports he woke up during the night last night with fast heart beat and chest discomfort. Patient reports symptoms resolved during the night, he did not use NTG, no symptoms at this time, feels okay.  Patient advised to take it easy today, stay well hydrated, use NTG prn for cardiac symptoms, call 911 for any concerning/unrelieved/persistent symptoms.

## 2023-01-19 ENCOUNTER — Other Ambulatory Visit (HOSPITAL_COMMUNITY): Payer: Self-pay

## 2023-01-19 ENCOUNTER — Encounter (HOSPITAL_COMMUNITY)
Admission: RE | Disposition: A | Discharge status: Home / Self Care | Payer: Self-pay | Source: Home / Self Care | Attending: Cardiology

## 2023-01-19 ENCOUNTER — Ambulatory Visit: Payer: PPO

## 2023-01-19 ENCOUNTER — Ambulatory Visit (HOSPITAL_COMMUNITY)
Admission: RE | Admit: 2023-01-19 | Discharge: 2023-01-19 | Disposition: A | Discharge status: Home / Self Care | Payer: PPO | Attending: Cardiology | Admitting: Cardiology

## 2023-01-19 ENCOUNTER — Other Ambulatory Visit: Payer: Self-pay

## 2023-01-19 DIAGNOSIS — Y832 Surgical operation with anastomosis, bypass or graft as the cause of abnormal reaction of the patient, or of later complication, without mention of misadventure at the time of the procedure: Secondary | ICD-10-CM | POA: Insufficient documentation

## 2023-01-19 DIAGNOSIS — T82855A Stenosis of coronary artery stent, initial encounter: Secondary | ICD-10-CM | POA: Insufficient documentation

## 2023-01-19 DIAGNOSIS — I25119 Atherosclerotic heart disease of native coronary artery with unspecified angina pectoris: Secondary | ICD-10-CM | POA: Diagnosis present

## 2023-01-19 DIAGNOSIS — Z955 Presence of coronary angioplasty implant and graft: Secondary | ICD-10-CM | POA: Diagnosis not present

## 2023-01-19 DIAGNOSIS — Z9861 Coronary angioplasty status: Secondary | ICD-10-CM

## 2023-01-19 DIAGNOSIS — E785 Hyperlipidemia, unspecified: Secondary | ICD-10-CM | POA: Diagnosis not present

## 2023-01-19 DIAGNOSIS — E119 Type 2 diabetes mellitus without complications: Secondary | ICD-10-CM | POA: Diagnosis not present

## 2023-01-19 DIAGNOSIS — Z79899 Other long term (current) drug therapy: Secondary | ICD-10-CM | POA: Insufficient documentation

## 2023-01-19 DIAGNOSIS — I2510 Atherosclerotic heart disease of native coronary artery without angina pectoris: Secondary | ICD-10-CM | POA: Diagnosis not present

## 2023-01-19 DIAGNOSIS — R0609 Other forms of dyspnea: Secondary | ICD-10-CM | POA: Insufficient documentation

## 2023-01-19 DIAGNOSIS — I10 Essential (primary) hypertension: Secondary | ICD-10-CM | POA: Insufficient documentation

## 2023-01-19 DIAGNOSIS — E7800 Pure hypercholesterolemia, unspecified: Secondary | ICD-10-CM

## 2023-01-19 HISTORY — PX: LEFT HEART CATH AND CORONARY ANGIOGRAPHY: CATH118249

## 2023-01-19 HISTORY — PX: CORONARY ULTRASOUND/IVUS: CATH118244

## 2023-01-19 HISTORY — PX: CORONARY BALLOON ANGIOPLASTY: CATH118233

## 2023-01-19 LAB — POCT ACTIVATED CLOTTING TIME
Activated Clotting Time: 275 s
Activated Clotting Time: 287 s

## 2023-01-19 LAB — GLUCOSE, CAPILLARY
Glucose-Capillary: 106 mg/dL — ABNORMAL HIGH (ref 70–99)
Glucose-Capillary: 84 mg/dL (ref 70–99)

## 2023-01-19 SURGERY — LEFT HEART CATH AND CORONARY ANGIOGRAPHY
Anesthesia: LOCAL

## 2023-01-19 MED ORDER — FENTANYL CITRATE (PF) 100 MCG/2ML IJ SOLN
INTRAMUSCULAR | Status: AC
  Filled 2023-01-19: qty 2

## 2023-01-19 MED ORDER — VERAPAMIL HCL 2.5 MG/ML IV SOLN
INTRAVENOUS | Status: AC
  Filled 2023-01-19: qty 2

## 2023-01-19 MED ORDER — ACETAMINOPHEN 325 MG PO TABS
650.0000 mg | ORAL_TABLET | ORAL | Status: DC | PRN
Start: 2023-01-19 — End: 2023-01-20

## 2023-01-19 MED ORDER — SODIUM CHLORIDE 0.9% FLUSH: 3.0000 mL | Freq: Two times a day (BID) | INTRAVENOUS | Status: DC

## 2023-01-19 MED ORDER — HEPARIN SODIUM (PORCINE) 1000 UNIT/ML IJ SOLN
INTRAMUSCULAR | Status: DC | PRN
  Administered 2023-01-19: 5000 [IU] via INTRAVENOUS
  Administered 2023-01-19: 2000 [IU] via INTRAVENOUS
  Administered 2023-01-19: 4000 [IU] via INTRAVENOUS

## 2023-01-19 MED ORDER — MIDAZOLAM HCL 2 MG/2ML IJ SOLN
INTRAMUSCULAR | Status: AC
  Filled 2023-01-19: qty 2

## 2023-01-19 MED ORDER — NITROGLYCERIN 1 MG/10 ML FOR IR/CATH LAB
INTRA_ARTERIAL | Status: DC | PRN
  Administered 2023-01-19: 200 ug via INTRACORONARY

## 2023-01-19 MED ORDER — SODIUM CHLORIDE 0.9% FLUSH: 3.0000 mL | INTRAVENOUS | Status: DC | PRN

## 2023-01-19 MED ORDER — LABETALOL HCL 5 MG/ML IV SOLN: 10.0000 mg | INTRAVENOUS | Status: DC | PRN

## 2023-01-19 MED ORDER — CLOPIDOGREL BISULFATE 75 MG PO TABS
75.0000 mg | ORAL_TABLET | Freq: Every day | ORAL | 1 refills | Status: DC
  Filled 2023-01-19: qty 90, 90d supply, fill #0

## 2023-01-19 MED ORDER — CLOPIDOGREL BISULFATE 300 MG PO TABS
ORAL_TABLET | ORAL | Status: DC | PRN
  Administered 2023-01-19: 600 mg via ORAL

## 2023-01-19 MED ORDER — FENTANYL CITRATE (PF) 100 MCG/2ML IJ SOLN
INTRAMUSCULAR | Status: DC | PRN
  Administered 2023-01-19: 25 ug via INTRAVENOUS

## 2023-01-19 MED ORDER — CLOPIDOGREL BISULFATE 300 MG PO TABS
ORAL_TABLET | ORAL | Status: AC
  Filled 2023-01-19: qty 2

## 2023-01-19 MED ORDER — EMPAGLIFLOZIN 10 MG PO TABS
10.0000 mg | ORAL_TABLET | Freq: Every day | ORAL | 0 refills | Status: DC
  Filled 2023-01-19: qty 30, 30d supply, fill #0

## 2023-01-19 MED ORDER — SODIUM CHLORIDE 0.9 % WEIGHT BASED INFUSION: 1.0000 mL/kg/h | INTRAVENOUS | Status: DC

## 2023-01-19 MED ORDER — MIDAZOLAM HCL 2 MG/2ML IJ SOLN
INTRAMUSCULAR | Status: DC | PRN
  Administered 2023-01-19: 1 mg via INTRAVENOUS

## 2023-01-19 MED ORDER — LIDOCAINE HCL (PF) 1 % IJ SOLN
INTRAMUSCULAR | Status: AC
  Filled 2023-01-19: qty 30

## 2023-01-19 MED ORDER — CLOPIDOGREL BISULFATE 75 MG PO TABS: 75.0000 mg | ORAL_TABLET | Freq: Every day | ORAL | Status: DC

## 2023-01-19 MED ORDER — SODIUM CHLORIDE 0.9 % WEIGHT BASED INFUSION
3.0000 mL/kg/h | INTRAVENOUS | Status: AC
  Administered 2023-01-19: 3 mL/kg/h via INTRAVENOUS

## 2023-01-19 MED ORDER — HYDRALAZINE HCL 20 MG/ML IJ SOLN: 10.0000 mg | INTRAMUSCULAR | Status: DC | PRN

## 2023-01-19 MED ORDER — ONDANSETRON HCL 4 MG/2ML IJ SOLN: 4.0000 mg | Freq: Four times a day (QID) | INTRAMUSCULAR | Status: DC | PRN

## 2023-01-19 MED ORDER — VERAPAMIL HCL 2.5 MG/ML IV SOLN
INTRAVENOUS | Status: DC | PRN
  Administered 2023-01-19: 10 mL via INTRA_ARTERIAL

## 2023-01-19 MED ORDER — NITROGLYCERIN 1 MG/10 ML FOR IR/CATH LAB
INTRA_ARTERIAL | Status: AC
  Filled 2023-01-19: qty 10

## 2023-01-19 MED ORDER — HEPARIN (PORCINE) IN NACL 1000-0.9 UT/500ML-% IV SOLN
INTRAVENOUS | Status: DC | PRN
  Administered 2023-01-19 (×2): 500 mL

## 2023-01-19 MED ORDER — SODIUM CHLORIDE 0.9 % IV SOLN: 250.0000 mL | INTRAVENOUS | Status: DC | PRN

## 2023-01-19 MED ORDER — LIDOCAINE HCL (PF) 1 % IJ SOLN
INTRAMUSCULAR | Status: DC | PRN
  Administered 2023-01-19: 2 mL

## 2023-01-19 MED ORDER — ASPIRIN 81 MG PO CHEW: 81.0000 mg | CHEWABLE_TABLET | ORAL | Status: DC

## 2023-01-19 MED ORDER — IOHEXOL 350 MG/ML SOLN
INTRAVENOUS | Status: DC | PRN
  Administered 2023-01-19: 65 mL via INTRA_ARTERIAL

## 2023-01-19 MED ORDER — ATORVASTATIN CALCIUM 40 MG PO TABS
40.0000 mg | ORAL_TABLET | Freq: Every day | ORAL | 11 refills | Status: DC
Start: 2023-01-19 — End: 2023-01-28

## 2023-01-19 MED ORDER — EMPAGLIFLOZIN 10 MG PO TABS: 10.0000 mg | ORAL_TABLET | Freq: Every day | ORAL | 1 refills | Status: DC

## 2023-01-19 SURGICAL SUPPLY — 16 items
BALLN SCOREFLEX 3.0X10 (BALLOONS) ×1
BALLOON SCOREFLEX 3.0X10 (BALLOONS) IMPLANT
CATH 5FR JL3.5 JR4 ANG PIG MP (CATHETERS) IMPLANT
CATH OPTICROSS HD (CATHETERS) IMPLANT
CATH VISTA GUIDE 6FR XBLAD3.5 (CATHETERS) IMPLANT
DEVICE RAD COMP TR BAND LRG (VASCULAR PRODUCTS) IMPLANT
GLIDESHEATH SLEND SS 6F .021 (SHEATH) IMPLANT
GUIDEWIRE INQWIRE 1.5J.035X260 (WIRE) IMPLANT
INQWIRE 1.5J .035X260CM (WIRE) ×2
KIT ENCORE 26 ADVANTAGE (KITS) IMPLANT
PACK CARDIAC CATHETERIZATION (CUSTOM PROCEDURE TRAY) ×2 IMPLANT
SET ATX-X65L (MISCELLANEOUS) IMPLANT
SHEATH PROBE COVER 6X72 (BAG) IMPLANT
SLED PULL BACK IVUS (MISCELLANEOUS) IMPLANT
TUBING CIL FLEX 10 FLL-RA (TUBING) IMPLANT
WIRE ASAHI PROWATER 180CM (WIRE) IMPLANT

## 2023-01-19 NOTE — Interval H&P Note (Signed)
History and Physical Interval Note:  01/19/2023 12:24 PM  Steven Yates. [name]  has presented today for surgery, with the diagnosis of dyspnea.  The various methods of treatment have been discussed with the patient and family. After consideration of risks, benefits and other options for treatment, the patient has consented to  Procedure(s): LEFT HEART CATH AND CORONARY ANGIOGRAPHY (N/A) as a surgical intervention.  The patient's history has been reviewed, patient examined, no change in status, stable for surgery.  I have reviewed the patient's chart and labs.  Questions were answered to the patient's satisfaction.   Cath Lab Visit (complete for each Cath Lab visit)  Clinical Evaluation Leading to the Procedure:   ACS: No.  Non-ACS:    Anginal Classification: CCS III  Anti-ischemic medical therapy: Minimal Therapy (1 class of medications)  Non-Invasive Test Results: No non-invasive testing performed  Prior CABG: No previous CABG        Theron Arista [facility] 01/19/2023 12:24 PM

## 2023-01-19 NOTE — Discharge Summary (Signed)
Discharge Summary for Same Day PCI   Patient ID: Steven Yates. Morini MRN: [id number]; DOB: 09/15/1940  Admit date: 01/19/2023 Discharge date: 01/19/2023  Primary Care Provider: Mliss Sax, MD  Primary Cardiologist: Peter Swaziland, MD  Primary Electrophysiologist:  None   Discharge Diagnoses    Active Problems:   Coronary artery disease involving native coronary artery of native heart with angina pectoris [facility])  Diagnostic Studies/Procedures    Cardiac Catheterization 01/19/2023:    Prox RCA lesion is 30% stenosed.   Prox LAD lesion is 90% stenosed.   Previously placed Prox LAD to Mid LAD stent of unknown type is  widely patent.   Scoring balloon angioplasty was performed using a BALLN SCOREFLEX 3.0X10.   Post intervention, there is a 0% residual stenosis.   LV end diastolic pressure is severely elevated.   Recommend uninterrupted dual antiplatelet therapy with Aspirin 81mg  daily and Clopidogrel 75mg  daily for a minimum of 6 months (stable ischemic heart disease-Class I recommendation).   Single vessel obstructive CAD involving the proximal LAD with focal in stent restenosis High LVEDP Successful PCI of the proximal LAD with scoring balloon with IVUS guidance   Plan: DAPT for 6 months with ASA and Plavix. Needs diuresis. Strongly consider adding SGLT 2 inhibitor. Patient is a candidate for same day DC  Diagnostic Dominance: Right  Intervention   _____________   History of Present Illness     Steven Yates. Stengel is a 82 y.o. male with  a hx of CAD status post CTO PCI to LAD with DES x 2 in 2016, hypertension, T2DM, hyperlipidemia. He is a former patient of Dr. Katrinka Blazing and now sees Dr. Swaziland. He's been seen in Jan of this year by Dr. Bjorn Pippin for dyspnea. Echo was unremarkable and Lexiscan SPECT.  An area of possible scar apically as well as possible peri-infarct ischemia however Dr. Bjorn Pippin felt it was very mild. He can walk to the parking lot from here, but any  further can get some SOB. He notes increased fatigue with carrying groceries. He notes shoulder pain. He has increased indigestion.  He notes sleeping in a chair 2-3 hours per night. He is not on a diuretic. His ankles have been swelling. His wife is concerned about progressive coronary disease. Given his complaints of worsening dyspnea, it was recommended that he undergo cardiac cath.     Hospital Course     The patient underwent cardiac cath as noted above with 90% pLAD of previously placed stent. Plan for DAPT with ASA/plavix for at least 6 months. LVEDP was elevated at . The patient was seen by cardiac rehab while in short stay. There were no observed complications post cath. Radial cath site was re-evaluated prior to discharge and found to be stable without any complications. Instructions/precautions regarding cath site care were given prior to discharge.  Steven Yates was seen by Dr. Swaziland and determined stable for discharge home. Follow up with our office has been arranged. Medications are listed below. Pertinent changes include addition of plavix, along with jardiance. Instructed to continue lasix 20mg  daily. He reported a headache while on Imdur, therefore this was stopped post intervention. Atorvastatin was increased to 40mg  daily at discharge.  _____________  Cath/PCI Registry Performance & Quality Measures: Aspirin prescribed? - Yes ADP Receptor Inhibitor (Plavix/Clopidogrel, Brilinta/Ticagrelor or Effient/Prasugrel) prescribed (includes medically managed patients)? - Yes High Intensity Statin (Lipitor 40-80mg  or Crestor 20-40mg ) prescribed? - Yes For EF <40%, was ACEI/ARB prescribed? - Not Applicable (EF >/= 40%)  For EF <40%, Aldosterone Antagonist (Spironolactone or Eplerenone) prescribed? - Not Applicable (EF >/= 40%) Cardiac Rehab Phase II ordered (Included Medically managed Patients)? - Yes  _____________   Discharge Vitals Blood pressure (!) 166/133, pulse 66,  resp. rate 17, height 5\' 11"  (1.803 m), weight 94.3 kg, SpO2 100%.  Filed Weights   01/19/23 0923  Weight: 94.3 kg    Last Labs & Radiologic Studies    CBC No results for input(s): "WBC", "NEUTROABS", "HGB", "HCT", "MCV", "PLT" in the last 72 hours. Basic Metabolic Panel No results for input(s): "NA", "K", "CL", "CO2", "GLUCOSE", "BUN", "CREATININE", "CALCIUM", "MG", "PHOS" in the last 72 hours. Liver Function Tests No results for input(s): "AST", "ALT", "ALKPHOS", "BILITOT", "PROT", "ALBUMIN" in the last 72 hours. No results for input(s): "LIPASE", "AMYLASE" in the last 72 hours. High Sensitivity Troponin:   No results for input(s): "TROPONINIHS" in the last 720 hours.  BNP Invalid input(s): "POCBNP" D-Dimer No results for input(s): "DDIMER" in the last 72 hours. Hemoglobin A1C No results for input(s): "HGBA1C" in the last 72 hours. Fasting Lipid Panel No results for input(s): "CHOL", "HDL", "LDLCALC", "TRIG", "CHOLHDL", "LDLDIRECT" in the last 72 hours. Thyroid Function Tests No results for input(s): "TSH", "T4TOTAL", "T3FREE", "THYROIDAB" in the last 72 hours.  Invalid input(s): "FREET3" _____________  CARDIAC CATHETERIZATION  Result Date: 01/19/2023   Prox RCA lesion is 30% stenosed.   Prox LAD lesion is 90% stenosed.   Previously placed Prox LAD to Mid LAD stent of unknown type is  widely patent.   Scoring balloon angioplasty was performed using a BALLN SCOREFLEX 3.0X10.   Post intervention, there is a 0% residual stenosis.   LV end diastolic pressure is severely elevated.   Recommend uninterrupted dual antiplatelet therapy with Aspirin 81mg  daily and Clopidogrel 75mg  daily for a minimum of 6 months (stable ischemic heart disease-Class I recommendation). Single vessel obstructive CAD involving the proximal LAD with focal in stent restenosis High LVEDP Successful PCI of the proximal LAD with scoring balloon with IVUS guidance Plan: DAPT for 6 months with ASA and Plavix. Needs  diuresis. Strongly consider adding SGLT 2 inhibitor. Patient is a candidate for same day DC    Disposition   Pt is being discharged home today in good condition.  Follow-up Plans & Appointments     Follow-up Information     Jodelle Gross, NP Follow up on 01/28/2023.   Specialties: Cardiology, Radiology, Cardiology Why: at 8:25am for your follow up appt with cardiology Contact information: [address] [place] [address] [phone]                Discharge Instructions     Amb Referral to Cardiac Rehabilitation   Complete by: As directed    Diagnosis: PTCA   After initial evaluation and assessments completed: Virtual Based Care may be provided alone or in conjunction with Phase 2 Cardiac Rehab based on patient barriers.: Yes   Intensive Cardiac Rehabilitation (ICR) [facility] location only OR Traditional Cardiac Rehabilitation (TCR) *If criteria for ICR are not met will enroll in TCR [facility] only): Yes        Discharge Medications   Allergies as of 01/19/2023       Reactions   Lisinopril    dizziness   Tape Rash        Medication List     STOP taking these medications    isosorbide mononitrate 30 MG 24 hr tablet Commonly known as: IMDUR  TAKE these medications    acetaminophen 650 MG CR tablet Commonly known as: TYLENOL Take 650 mg by mouth at bedtime.   aspirin EC 81 MG tablet Take 81 mg by mouth daily. Swallow whole.   atorvastatin 40 MG tablet Commonly known as: Lipitor Take 1 tablet (40 mg total) by mouth daily. What changed:  medication strength how much to take how to take this when to take this additional instructions   clopidogrel 75 MG tablet Commonly known as: PLAVIX Take 1 tablet (75 mg total) by mouth daily with breakfast. Start taking on: January 20, 2023   empagliflozin 10 MG Tabs tablet Commonly known as: Jardiance Take 1 tablet (10 mg total) by mouth daily before breakfast.   furosemide 20 MG  tablet Commonly known as: LASIX Take 1 tablet (20 mg total) by mouth daily.   glimepiride 2 MG tablet Commonly known as: AMARYL Take 1 tablet (2 mg total) by mouth daily before breakfast.   ICAPS AREDS 2 PO Take 1 capsule by mouth in the morning and at bedtime.   losartan 100 MG tablet Commonly known as: COZAAR Take 1 tablet (100 mg total) by mouth daily.   metFORMIN 500 MG 24 hr tablet Commonly known as: GLUCOPHAGE-XR Take 2 tablets (1,000 mg total) by mouth 2 (two) times daily with a meal. What changed: when to take this   pantoprazole 40 MG tablet Commonly known as: PROTONIX Take 1 tablet (40 mg total) by mouth daily.   tamsulosin 0.4 MG Caps capsule Commonly known as: FLOMAX Take 0.4 mg by mouth daily.   VABYSMO IZ by Intravitreal route every 8 (eight) weeks. One injection in left eye every 8 weeks.         Allergies Allergies  Allergen Reactions   Lisinopril     dizziness    Tape Rash    Outstanding Labs/Studies   FLP/LFTs in 8 weeks  Duration of Discharge Encounter   Greater than 30 minutes including physician time.  Signed, Laverda Page, NP 01/19/2023, 4:09 PM

## 2023-01-19 NOTE — Progress Notes (Signed)
Patient and wife was given discharge instructions. Both verbalized understanding. 

## 2023-01-19 NOTE — TOC Benefit Eligibility Note (Signed)
Patient Product/process development scientist completed.    The patient is insured through [employer]/ [employer]. Patient has Medicare and is not eligible for a copay card, but may be able to apply for patient assistance, if available.    Ran test claim for Farxiga 10 mg and the current 30 day co-pay is $47.00.  Ran test claim for Jardiance 10 mg and the current 30 day co-pay is $47.00.  This test claim was processed through [facility]- copay amounts may vary at other pharmacies due to pharmacy/plan contracts, or as the patient moves through the different stages of their insurance plan.     Roland Earl, CPHT Pharmacy Technician III Certified Patient Advocate [facility] Patient Advocate Team Direct Number: [phone]  Fax: [phone]

## 2023-01-19 NOTE — Progress Notes (Signed)
CARDIAC REHAB PHASE I     Post stent education including site care, restrictions, risk factors, exercise guidelines, NTG use, antiplatelet therapy importance, heart healthy diabetic diet and CRP2 reviewed. All questions and concerns addressed. Will refer to [facility] for CRP2. Plan for home later today.    [phone] Woodroe Chen, RN BSN 01/19/2023 2:28 PM

## 2023-01-19 NOTE — Discharge Instructions (Signed)

## 2023-01-20 ENCOUNTER — Encounter (HOSPITAL_COMMUNITY): Payer: Self-pay | Admitting: Cardiology

## 2023-01-22 ENCOUNTER — Telehealth (HOSPITAL_COMMUNITY): Payer: Self-pay

## 2023-01-22 NOTE — Telephone Encounter (Signed)
Pt insurance is active and benefits verified through [employer] Co-pay $15, DED 0/0 met, out of pocket $3,200/$2,122.21 met, co-insurance 0%. no pre-authorization required, Valerie/[employer] 10/25@10 :77, REF# [id number]   How many CR sessions are covered? (for ICR)72 Is this a lifetime maximum or an annual maximum? annual Has the member used any of these services to date? no Is there a time limit (weeks/months) on start of program and/or program completion? no     Will contact patient to see if he is interested in the Cardiac Rehab Program. If interested, patient will need to complete follow up appt. Once completed, patient will be contacted for scheduling upon review by the RN Navigator.

## 2023-01-22 NOTE — Telephone Encounter (Signed)
Called patient to see if he is interested in the Cardiac Rehab Program. Patient expressed interest. Explained scheduling process and went over insurance, patient verbalized understanding. Will contact patient for scheduling once f/u has been completed.  °

## 2023-01-25 NOTE — Progress Notes (Unsigned)
Cardiology Office Note:  .   Date:  01/28/2023  ID:  Steven Yates. Steven, Yates July 15, 1940, MRN [id number] PCP: Steven Sax, MD  [facility] Providers Cardiologist:  Peter Swaziland MD }   History of Present Illness: .   Steven Yates Retterer is a 82 y.o. male with CAD s/p cardiac cath on 01/19/2023 with DES to proximal LAD 01/19/2023, status post CTO PCI to LAD with DES x 2 in 2016, hypertension, T2DM, hyperlipidemia.   He comes today feeling very well.  He denies any further discomfort in his chest.  He states he has felt so much better since having the angioplasty.  He is able to walk through [place] without being short of breath or having chest pressure.  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  He denies any new symptoms.  He is medically compliant and requires refills to the [facility] in [place]  ROS:   Studies Reviewed: .   Cardiac Catheterization 01/19/2023:     Prox RCA lesion is 30% stenosed.   Prox LAD lesion is 90% stenosed.   Previously placed Prox LAD to Mid LAD stent of unknown type is  widely patent.   Scoring balloon angioplasty was performed using a BALLN SCOREFLEX 3.0X10.   Post intervention, there is a 0% residual stenosis.   LV end diastolic pressure is severely elevated.   Recommend uninterrupted dual antiplatelet therapy with Aspirin 81mg  daily and Clopidogrel 75mg  daily for a minimum of 6 months (stable ischemic heart disease-Class I recommendation).   Single vessel obstructive CAD involving the proximal LAD with focal in stent restenosis High LVEDP Successful PCI of the proximal LAD with scoring balloon with IVUS guidance   Plan: DAPT for 6 months with ASA and Plavix. Needs diuresis. Strongly consider adding SGLT 2 inhibitor. Patient is a candidate for same day DC    EKG Interpretation Date/Time:  Thursday January 28 2023 08:29:20 EDT Ventricular Rate:  59 PR Interval:  174 QRS Duration:  112 QT  Interval:  434 QTC Calculation: 429 R Axis:   -28  Text Interpretation: Sinus bradycardia with occasional Premature ventricular complexes Minimal voltage criteria for LVH, may be normal variant ( Cornell product ) Anteroseptal infarct , age undetermined When compared with ECG of 19-Jan-2023 09:28, Premature ventricular complexes are now Present Anteroseptal infarct is now Present ST elevation now present in Anterior leads Confirmed by Joni Reining [phone]) on 01/28/2023 10:09:39 AM    Physical Exam:   VS:  BP (!) 140/58   Pulse (!) 59   Ht 5\' 9"  (1.753 m)   Wt 204 lb (92.5 kg)   SpO2 97%   BMI 30.13 kg/m    Wt Readings from Last 3 Encounters:  01/28/23 204 lb (92.5 kg)  01/19/23 208 lb (94.3 kg)  01/07/23 208 lb 12.8 oz (94.7 kg)    GEN: Well nourished, well developed in no acute distress NECK: No JVD; No carotid bruits CARDIAC: RRR,occasional extra systole,  no murmurs, rubs, gallops RESPIRATORY:  Clear to auscultation without rales, wheezing or rhonchi  ABDOMEN: Soft, non-tender, non-distended EXTREMITIES:  Right wrist catheter insertion is well healed.Extensive ecchymosis proximal to the insertion site on the right wrist and also on left antecubital at prior IV site.   ASSESSMENT AND PLAN: .   Coronary artery disease: Status post cardiac catheterization with angioplasty of the proximal LAD stent decreasing from 90% to 0%.  I have reviewed the cardiac catheterization report  with the patient and given him a printed illustration with explanation.  He will continue dual antiplatelet therapy as directed, he refuses cardiac rehab.  He will follow-up with Dr. Swaziland on previously scheduled appointment.  He will need to have a follow-up BMET in 1 month for kidney function as Jardiance was added and he continues on Lasix.  2.  Hypertension: Blood pressure slightly elevated today.  At home it is usually lower.  Will continue him on the losartan as directed.  3.  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  Follow-up BMET should be completed in 1 month.  4.  Non-insulin-dependent diabetes: Followed by PCP.     Cardiac Rehabilitation Eligibility Assessment           Signed, Bettey Mare. Liborio Nixon, ANP, AACC

## 2023-01-27 ENCOUNTER — Ambulatory Visit (INDEPENDENT_AMBULATORY_CARE_PROVIDER_SITE_OTHER): Payer: PPO

## 2023-01-27 DIAGNOSIS — Z23 Encounter for immunization: Secondary | ICD-10-CM

## 2023-01-27 NOTE — Progress Notes (Signed)
High Dose flu shot given at left deltoid.

## 2023-01-28 ENCOUNTER — Ambulatory Visit: Payer: PPO | Attending: Adult Health | Admitting: Adult Health

## 2023-01-28 ENCOUNTER — Encounter: Payer: Self-pay | Admitting: Family Medicine

## 2023-01-28 ENCOUNTER — Ambulatory Visit (INDEPENDENT_AMBULATORY_CARE_PROVIDER_SITE_OTHER): Payer: PPO | Admitting: Family Medicine

## 2023-01-28 ENCOUNTER — Encounter: Payer: Self-pay | Admitting: Adult Health

## 2023-01-28 VITALS — BP 140/72 | HR 65 | Temp 97.8°F | Ht 69.0 in | Wt 204.4 lb

## 2023-01-28 VITALS — BP 140/58 | HR 59 | Ht 69.0 in | Wt 204.0 lb

## 2023-01-28 DIAGNOSIS — I25119 Atherosclerotic heart disease of native coronary artery with unspecified angina pectoris: Secondary | ICD-10-CM

## 2023-01-28 DIAGNOSIS — R7303 Prediabetes: Secondary | ICD-10-CM | POA: Diagnosis not present

## 2023-01-28 DIAGNOSIS — I10 Essential (primary) hypertension: Secondary | ICD-10-CM

## 2023-01-28 MED ORDER — CLOPIDOGREL BISULFATE 75 MG PO TABS: 75.0000 mg | ORAL_TABLET | Freq: Every day | ORAL | 1 refills | Status: DC

## 2023-01-28 MED ORDER — ATORVASTATIN CALCIUM 40 MG PO TABS: 40.0000 mg | ORAL_TABLET | Freq: Every day | ORAL | 11 refills | Status: DC

## 2023-01-28 MED ORDER — EMPAGLIFLOZIN 10 MG PO TABS: 10.0000 mg | ORAL_TABLET | Freq: Every day | ORAL | 1 refills | Status: DC

## 2023-01-28 NOTE — Patient Instructions (Addendum)
Medication Instructions:  No Changes *If you need a refill on your cardiac medications before your next appointment, please call your pharmacy*   Lab Work: BMET 1 month If you have labs (blood work) drawn today and your tests are completely normal, you will receive your results only by: MyChart Message (if you have MyChart) OR A paper copy in the mail If you have any lab test that is abnormal or we need to change your treatment, we will call you to review the results.   Testing/Procedures: No Testing   Follow-Up: At [facility], you and your health needs are our priority.  As part of our continuing mission to provide you with exceptional heart care, we have created designated Provider Care Teams.  These Care Teams include your primary Cardiologist (physician) and Advanced Practice Providers (APPs -  Physician Assistants and Nurse Practitioners) who all work together to provide you with the care you need, when you need it.  We recommend signing up for the patient portal called "MyChart".  Sign up information is provided on this After Visit Summary.  MyChart is used to connect with patients for Virtual Visits (Telemedicine).  Patients are able to view lab/test results, encounter notes, upcoming appointments, etc.  Non-urgent messages can be sent to your provider as well.   To learn more about what you can do with MyChart, go to [email or web address].    Your next appointment:   Keep Scheduled Appointment  Provider:    Peter Swaziland, MD

## 2023-01-28 NOTE — Progress Notes (Signed)
Established Patient Office Visit   Subjective:  Patient ID: Steven Yates, male    DOB: 16-Aug-1940  Age: 82 y.o. MRN: [id number]  Chief Complaint  Patient presents with   Medical Management of Chronic Issues    Pt Requested follow up from Cardiology.     HPI Encounter Diagnoses  Name Primary?   Prediabetes Yes   Essential hypertension    Coronary artery disease involving native coronary artery of native heart with angina pectoris (HCC)    For follow-up status post heart catheterization that revealed a proximal 90% lesion in his LAD.  Status post stenting on dual acting anti platelet therapy.  He is doing well.  Cardiology started [employer] [facility] [employer].  I just started Amaryl.   Review of Systems  Constitutional: Negative.  Negative for malaise/fatigue.  HENT: Negative.    Eyes:  Negative for blurred vision, discharge and redness.  Respiratory: Negative.  Negative for shortness of breath.   Cardiovascular: Negative.  Negative for chest pain.  Gastrointestinal:  Negative for abdominal pain.  Genitourinary: Negative.   Musculoskeletal: Negative.  Negative for myalgias.  Skin:  Negative for rash.  Neurological:  Negative for tingling, loss of consciousness and weakness.  Endo/Heme/Allergies:  Negative for polydipsia.     Current Outpatient Medications:    acetaminophen (TYLENOL) 650 MG CR tablet, Take 650 mg by mouth at bedtime., Disp: , Rfl:    aspirin EC 81 MG tablet, Take 81 mg by mouth daily. Swallow whole., Disp: , Rfl:    atorvastatin (LIPITOR) 40 MG tablet, Take 1 tablet (40 mg total) by mouth daily., Disp: 30 tablet, Rfl: 11   clopidogrel (PLAVIX) 75 MG tablet, Take 1 tablet (75 mg total) by mouth daily with breakfast., Disp: 90 tablet, Rfl: 1   empagliflozin (JARDIANCE) 10 MG TABS tablet, Take 1 tablet (10 mg total) by mouth daily before breakfast., Disp: 90 tablet, Rfl: 1   Faricimab-svoa (VABYSMO IZ), by Intravitreal route every 8 (eight) weeks. One injection in left eye  every 8 weeks., Disp: , Rfl:    furosemide (LASIX) 20 MG tablet, Take 1 tablet (20 mg total) by mouth daily., Disp: 90 tablet, Rfl: 3   losartan (COZAAR) 100 MG tablet, Take 1 tablet (100 mg total) by mouth daily., Disp: 90 tablet, Rfl: 2   metFORMIN (GLUCOPHAGE-XR) 500 MG 24 hr tablet, Take 2 tablets (1,000 mg total) by mouth 2 (two) times daily with a meal. (Patient taking differently: Take 1,000 mg by mouth daily.), Disp: 360 tablet, Rfl: 1   Multiple Vitamins-Minerals (ICAPS AREDS 2 PO), Take 1 capsule by mouth in the morning and at bedtime., Disp: , Rfl:    pantoprazole (PROTONIX) 40 MG tablet, Take 1 tablet (40 mg total) by mouth daily., Disp: 30 tablet, Rfl: 11   tamsulosin (FLOMAX) 0.4 MG CAPS capsule, Take 0.4 mg by mouth daily., Disp: , Rfl:    Objective:     BP (!) 140/72   Pulse 65   Temp 97.8 F (36.6 C)   Ht 5\' 9"  (1.753 m)   Wt 204 lb 6.4 oz (92.7 kg)   SpO2 97%   BMI 30.18 kg/m    Physical Exam Constitutional:      General: He is not in acute distress.    Appearance: Normal appearance. He is not ill-appearing, toxic-appearing or diaphoretic.  HENT:     Head: Normocephalic and atraumatic.     Right Ear: External ear normal.     Left Ear: External ear normal.  Eyes:  General: No scleral icterus.       Right eye: No discharge.        Left eye: No discharge.     Extraocular Movements: Extraocular movements intact.     Conjunctiva/sclera: Conjunctivae normal.  Pulmonary:     Effort: Pulmonary effort is normal. No respiratory distress.  Skin:    General: Skin is warm and dry.  Neurological:     Mental Status: He is alert and oriented to person, place, and time.  Psychiatric:        Mood and Affect: Mood normal.        Behavior: Behavior normal.      No results found for any visits on 01/28/23.    The ASCVD Risk score (Arnett DK, et al., 2019) failed to calculate for the following reasons:   The 2019 ASCVD risk score is only valid for ages 35 to  72    Assessment & Plan:   Prediabetes  Essential hypertension  Coronary artery disease involving native coronary artery of native heart with angina pectoris (HCC)    Return in about 10 weeks (around 04/08/2023).  Last A1c was down to 5.8.  With the Jardiance, can discontinue Amaryl.  Continue metformin.  Blood pressure under better control.  Continue all other medicines as directed.  Mliss Sax, MD

## 2023-02-02 ENCOUNTER — Encounter: Payer: Self-pay | Admitting: Cardiology

## 2023-02-02 MED ORDER — ATORVASTATIN CALCIUM 40 MG PO TABS: 40.0000 mg | ORAL_TABLET | Freq: Every day | ORAL | 2 refills | Status: AC

## 2023-02-15 ENCOUNTER — Telehealth: Payer: Self-pay | Admitting: Cardiology

## 2023-02-15 NOTE — Telephone Encounter (Signed)
Pt c/o medication issue:  1. Name of Medication: empagliflozin (JARDIANCE) 10 MG TABS tablet   2. How are you currently taking this medication (dosage and times per day)? Take 1 tablet (10 mg total) by mouth daily before breakfast.   3. Are you having a reaction (difficulty breathing--STAT)? No  4. What is your medication issue? [facility] because [facility] for [facility]. Patient stated Dr. Esperanza Richters fax number is [phone]. Patient would like a call back for an update. Please advise.

## 2023-02-15 NOTE — Telephone Encounter (Signed)
Patient identification verified by 2 forms. Steven Rail, RN    Called and spoke to patient  Patient request visit note/documentation sent to Dr. Willa Rough [address] [phone])  Patient states needs completed so Dr. Willa Rough can prescribe some medications  Patient states he completed ROI today in office  Patient states he will be out of medications in 3 day  Informed patient message sent

## 2023-02-15 NOTE — Telephone Encounter (Signed)
Patient identification verified by 2 forms. Marilynn Rail, RN    Called and spoke to patient  Patient states:   -needs some OV note faxed to [place] Dr. Esperanza Richters  Informed patient he will need to sign ROI to give permission for records to be faxed  Patient agrees, will present to office today  Patient has no further questions at this time

## 2023-02-22 DIAGNOSIS — I10 Essential (primary) hypertension: Secondary | ICD-10-CM | POA: Diagnosis not present

## 2023-02-23 ENCOUNTER — Other Ambulatory Visit: Payer: PPO

## 2023-02-25 LAB — BASIC METABOLIC PANEL
BUN/Creatinine Ratio: 13 (ref 10–24)
BUN: 12 mg/dL (ref 8–27)
CO2: 18 mmol/L — ABNORMAL LOW (ref 20–29)
Calcium: 9.1 mg/dL (ref 8.6–10.2)
Chloride: 104 mmol/L (ref 96–106)
Creatinine, Ser: 0.9 mg/dL (ref 0.76–1.27)
Glucose: 157 mg/dL — ABNORMAL HIGH (ref 70–99)
Potassium: 3.9 mmol/L (ref 3.5–5.2)
Sodium: 141 mmol/L (ref 134–144)
eGFR: 86 mL/min/{1.73_m2} (ref >59)

## 2023-03-01 ENCOUNTER — Telehealth: Payer: Self-pay

## 2023-03-01 NOTE — Telephone Encounter (Addendum)
Results viewed by patient via Mychart.----- Message from Joni Reining sent at 02/26/2023  5:54 PM EST ----- Labs have been reviewed,  Normal except for glucose elevation Not done fasting.   KL

## 2023-03-01 NOTE — Telephone Encounter (Addendum)
Results viewed by patient via MyChart.----- Message from Joni Reining sent at 02/26/2023  5:54 PM EST ----- Labs have been reviewed,  Normal except for glucose elevation Not done fasting.   KL

## 2023-03-17 DIAGNOSIS — H353221 Exudative age-related macular degeneration, left eye, with active choroidal neovascularization: Secondary | ICD-10-CM | POA: Diagnosis not present

## 2023-03-31 NOTE — Progress Notes (Signed)
 Cardiology Office Note:    Date:  04/08/2023   ID:  Steven Yates, Steven Yates May 06, 1940, MRN [id number]  PCP:  Berneta Elsie Sayre, MD  Cardiologist:  Marcianna Daily, MD  Electrophysiologist:  None   Referring MD: Berneta Elsie Sayre,*   Chief Complaint  Patient presents with   Coronary Artery Disease    History of Present Illness:    Steven Yates is a 83 y.o. male seen for evaluation of SOB. He has a hx of CAD status post CTO PCI to LAD with DES x 2 in 2016, hypertension, T2DM, hyperlipidemia. He is a former patient of Dr. Claudene.    Most recent cath 09/01/2016 showed mild to moderate in-stent restenosis in LAD stent, mild nonobstructive disease in LCx and RCA.  He was seen in Jan by Dr Kate - noted dyspnea at that time. Echo was normal and Myoview  was low risk. Dr Kate reviewed and did not see evidence of ischemia. Significantly improved from 2015.   When I saw him in July he was doing well. In October he presented with worsening DOE. He underwent repeat cardiac cath showing severe focal in stent restenosis at the ostium of the LAD. This was treated with scoring balloon PCI with IVUS guidance. On follow up post procedure he noted a significant improvement.   On follow up today he is feeling well. No dyspnea or chest pain. Noted BP was high and PCP increased losartan  to 100 mg daily. Does not monitor BP at home. No edema. Doesn't take lasix  regularly.   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 Claudene   CARDIAC CATHETERIZATION  04/04/2014   Procedure:  CORONARY/BYPASS GRAFT CTO INTERVENTION;  Surgeon: Garnette Greb M Letta Cargile, MD;  Location: [facility] CATH LAB;  Service: Cardiovascular;;   CARPAL TUNNEL RELEASE     bil   2019   CHOLECYSTECTOMY  2010   CORONARY ANGIOPLASTY WITH STENT PLACEMENT  04/04/2014   2   CORONARY BALLOON ANGIOPLASTY N/A 01/19/2023   Procedure: CORONARY BALLOON ANGIOPLASTY;  Surgeon: Micheala Morissette M, MD;  Location: [facility] INVASIVE CV LAB;  Service: Cardiovascular;  Laterality: N/A;   CORONARY PRESSURE/FFR STUDY N/A 09/01/2016   Procedure: Intravascular Pressure Wire/FFR Study;  Surgeon: Claudene Victory ORN, MD;  Location: [facility] INVASIVE CV LAB;  Service: Cardiovascular;  Laterality: N/A;   CORONARY ULTRASOUND/IVUS N/A 01/19/2023   Procedure: Coronary Ultrasound/IVUS;  Surgeon: Azarel Banner M, MD;  Location: [facility] INVASIVE CV LAB;  Service: Cardiovascular;  Laterality: N/A;   INGUINAL HERNIA REPAIR Bilateral 1966?   LEFT HEART CATH AND CORONARY ANGIOGRAPHY N/A 09/01/2016   Procedure: Left Heart Cath and Coronary Angiography;  Surgeon: Claudene Victory ORN, MD;  Location: [facility] INVASIVE CV LAB;  Service: Cardiovascular;  Laterality: N/A;   LEFT HEART CATH AND CORONARY ANGIOGRAPHY N/A 01/19/2023   Procedure: LEFT HEART CATH AND CORONARY ANGIOGRAPHY;  Surgeon: Zubayr Bednarczyk M, MD;  Location: [facility] INVASIVE CV LAB;  Service: Cardiovascular;  Laterality: N/A;   LEFT HEART CATHETERIZATION WITH CORONARY ANGIOGRAM  N/A 01/30/2014   Procedure: LEFT HEART CATHETERIZATION WITH CORONARY ANGIOGRAM;  Surgeon: Victory LELON Claudene DOUGLAS, MD;  Location: [facility] CATH LAB;  Service: Cardiovascular;  Laterality: N/A;   LUMBAR DISC SURGERY  1980's X 2   ruptured disc   PERCUTANEOUS CORONARY STENT INTERVENTION (PCI-S)  01/30/2014   Procedure: PERCUTANEOUS CORONARY STENT INTERVENTION (PCI-S);  Surgeon: Victory LELON Claudene DOUGLAS, MD;  Location: [facility] CATH LAB;  Service: Cardiovascular;;   SHOULDER OPEN ROTATOR CUFF REPAIR Right 1990's   TOTAL KNEE ARTHROPLASTY Left 06/06/2018   Procedure: TOTAL KNEE ARTHROPLASTY;   Surgeon: Melodi Lerner, MD;  Location: [facility] ORS;  Service: Orthopedics;  Laterality: Left;    ULTRASOUND GUIDANCE FOR VASCULAR ACCESS  09/01/2016   Procedure: Ultrasound Guidance For Vascular Access;  Surgeon: Claudene Victory LELON, MD;  Location: [facility] INVASIVE CV LAB;  Service: Cardiovascular;;    Current Medications: Current Meds  Medication Sig   acetaminophen  (TYLENOL ) 650 MG CR tablet Take 650 mg by mouth at bedtime.   amLODipine  (NORVASC ) 5 MG tablet Take 1 tablet (5 mg total) by mouth daily.   aspirin  EC 81 MG tablet Take 81 mg by mouth daily. Swallow whole.   atorvastatin  (LIPITOR) 40 MG tablet Take 1 tablet (40 mg total) by mouth daily.   clopidogrel  (PLAVIX ) 75 MG tablet Take 1 tablet (75 mg total) by mouth daily with breakfast.   empagliflozin  (JARDIANCE ) 10 MG TABS tablet Take 1 tablet (10 mg total) by mouth daily before breakfast.   Faricimab -svoa (VABYSMO  IZ) by Intravitreal route every 8 (eight) weeks. One injection in left eye every 8 weeks.   metFORMIN  (GLUCOPHAGE -XR) 500 MG 24 hr tablet Take 2 tablets (1,000 mg total) by mouth 2 (two) times daily with a meal. (Patient taking differently: Take 1,000 mg by mouth daily.)   Multiple Vitamins-Minerals (ICAPS AREDS 2 PO) Take 1 capsule by mouth in the morning and at bedtime.   tamsulosin (FLOMAX) 0.4 MG CAPS capsule Take 0.4 mg by mouth daily.   [DISCONTINUED] losartan  (COZAAR ) 100 MG tablet Take 1 tablet (100 mg total) by mouth daily.   [DISCONTINUED] pantoprazole  (PROTONIX ) 40 MG tablet Take 1 tablet (40 mg total) by mouth daily.     Allergies:   Lisinopril  and Tape   Social History   Socioeconomic History   Marital status: Married    Spouse name: Not on file   Number of children: 2   Years of education: Not on file   Highest education level: Some college, no degree  Occupational History   Occupation: [employer]  Tobacco Use   Smoking status: Former    Types: Cigars    Quit date: 04/29/2018    Years since  quitting: 4.9   Smokeless tobacco: Never  Vaping Use   Vaping status: Never Used  Substance and Sexual Activity   Alcohol use: No    Alcohol/week: 0.0 standard drinks of alcohol   Drug use: No   Sexual activity: Yes  Other Topics Concern   Not on file  Social History Narrative   Not on file   Social Drivers of Health   Financial Resource Strain: Low Risk  (01/27/2023)   Overall Financial Resource Strain (CARDIA)    Difficulty of Paying Living Expenses: Not very hard  Food Insecurity: No Food Insecurity (01/27/2023)   Hunger Vital Sign    Worried About Running Out of Food in the Last Year: Never true    Ran Out of Food in the Last Year: Never true  Transportation Needs: No Transportation Needs (01/27/2023)   PRAPARE - Administrator, Civil Service (Medical): No    Lack of Transportation (Non-Medical): No  Physical Activity: Insufficiently Active (01/27/2023)   Exercise Vital Sign    Days of Exercise per Week: 3 days    Minutes of Exercise per Session: 10 min  Stress: No Stress Concern Present (01/27/2023)   [employer] of Occupational Health - Occupational Stress Questionnaire    Feeling of Stress : Not at all  Recent Concern: Stress - Stress Concern Present (12/13/2022)   [employer] of Occupational Health - Occupational Stress Questionnaire    Feeling of Stress : To some extent  Social Connections: Moderately Integrated (01/27/2023)   Social Connection and Isolation Panel [NHANES]    Frequency of Communication with Friends and Family: More than three times a week    Frequency of Social Gatherings with Friends and Family: Three times a week    Attends Religious Services: More than 4 times per year    Active Member of Clubs or Organizations: No    Attends Engineer, Structural: Not on file    Marital Status: Married     Family History: The patient's family history includes Cancer in his brother; Emphysema in his father; Heart disease in his  brother and mother; Other in his sister.  ROS:   Please see the history of present illness.     All other systems reviewed and are negative.  EKGs/Labs/Other Studies Reviewed:    The following studies were reviewed today:  Echo 04/24/22: IMPRESSIONS     1. Left ventricular ejection fraction, by estimation, is 60 to 65%. The  left ventricle has normal function. The left ventricle has no regional  wall motion abnormalities. Left ventricular diastolic parameters are  consistent with Grade I diastolic  dysfunction (impaired relaxation).   2. Right ventricular systolic function is normal. The right ventricular  size is normal. There is normal pulmonary artery systolic pressure.   3. Left atrial size was mildly dilated.   4. The mitral valve is normal in structure. Trivial mitral valve  regurgitation. No evidence of mitral stenosis.   5. The aortic valve is tricuspid. Aortic valve regurgitation is not  visualized. No aortic stenosis is present.   6. The inferior vena cava is normal in size with greater than 50%  respiratory variability, suggesting right atrial pressure of 3 mmHg.   Myoview  04/24/22: Study Highlights      Findings are consistent with infarction with peri-infarct ischemia. The study is intermediate risk.   No ST deviation was noted.   Left ventricular function is normal. Nuclear stress EF: 56 %. The left ventricular ejection fraction is normal (55-65%). End diastolic cavity size is normal.   Prior study available for comparison from 01/29/2014.   small size and mild intensity apical/apical anterior fixed lesion c/w possible scar with peri-infarct ischemia.  Prior nuclear study was low risk, do not have the images to compare.    Cardiac cath 01/19/23:  LEFT HEART CATH AND CORONARY ANGIOGRAPHY  CORONARY BALLOON ANGIOPLASTY  Coronary Ultrasound/IVUS   Conclusion      Prox RCA lesion is 30% stenosed.   Prox LAD lesion is 90% stenosed.   Previously placed Prox LAD to  Mid LAD stent of unknown type is  widely patent.   Scoring balloon angioplasty was performed using a BALLN SCOREFLEX 3.0X10.   Post intervention, there is a 0% residual stenosis.   LV end diastolic pressure is  severely elevated.   Recommend uninterrupted dual antiplatelet therapy with Aspirin  81mg  daily and Clopidogrel  75mg  daily for a minimum of 6 months (stable ischemic heart disease-Class I recommendation).   Single vessel obstructive CAD involving the proximal LAD with focal in stent restenosis High LVEDP Successful PCI of the proximal LAD with scoring balloon with IVUS guidance   Plan: DAPT for 6 months with ASA and Plavix . Needs diuresis. Strongly consider adding SGLT 2 inhibitor. Patient is a candidate for same day DC  Coronary Diagrams  Diagnostic Dominance: Right  Intervention           Recent Labs: 06/09/2022: TSH 1.29 01/07/2023: BNP 80.5; Hemoglobin 11.3; Platelets 147 02/22/2023: BUN 12; Creatinine, Ser 0.90; Potassium 3.9; Sodium 141  Recent Lipid Panel    Component Value Date/Time   CHOL 104 01/21/2022 0856   TRIG 116.0 01/21/2022 0856   HDL 34.70 (L) 01/21/2022 0856   CHOLHDL 3 01/21/2022 0856   VLDL 23.2 01/21/2022 0856   LDLCALC 46 01/21/2022 0856   LDLDIRECT 54.0 12/14/2022 0938    Physical Exam:    VS:  BP (!) 168/80 (BP Location: Left Arm, Patient Position: Sitting, Cuff Size: Normal)   Pulse 64   Ht 5' 9 (1.753 m)   Wt 197 lb (89.4 kg)   BMI 29.09 kg/m     Wt Readings from Last 3 Encounters:  04/08/23 197 lb (89.4 kg)  01/28/23 204 lb 6.4 oz (92.7 kg)  01/28/23 204 lb (92.5 kg)     GEN:  Well nourished, well developed in no acute distress HEENT: Normal NECK: No JVD; No carotid bruits LYMPHATICS: No lymphadenopathy CARDIAC: RRR, no murmurs, rubs, gallops RESPIRATORY:  Clear to auscultation without rales, wheezing or rhonchi  ABDOMEN: Soft, non-tender, non-distended MUSCULOSKELETAL:  No edema; No deformity  SKIN: Warm and  dry NEUROLOGIC:  Alert and oriented x 3 PSYCHIATRIC:  Normal affect   ASSESSMENT:    1. Coronary artery disease of native artery of native heart with stable angina pectoris (HCC)   2. Essential hypertension   3. Abnormal nuclear stress test   4. Pure hypercholesterolemia      PLAN:    1.   CAD:  status post CTO PCI to LAD with DES x 2 in 2016.  Cath 09/01/2016 showed mild to moderate in-stent restenosis in LAD stent, mild nonobstructive disease in LCx and RCA - Myoview  in Jan 2024 with small scar minimal ischemia. Low risk. Echo normal - progressive DOE in October with severe focal in stent restenosis in the ostial LAD treated with Scoring balloon angioplasty. Excellent response with resolution of symptoms. Continue DAPT for at least 6 months.  2.   Hypertension: On losartan  100 mg daily. Poorly  controlled. Will add amlodipine  5 mg daily. Recommend he get a home BP monitor to follow.  3.   Hyperlipidemia: On atorvastatin  40mg  daily.  LDL 54.     Follow-up in 6 months.  No orders of the defined types were placed in this encounter.  Meds ordered this encounter  Medications   amLODipine  (NORVASC ) 5 MG tablet    Sig: Take 1 tablet (5 mg total) by mouth daily.    Dispense:  180 tablet    Refill:  3   losartan  (COZAAR ) 100 MG tablet    Sig: Take 1 tablet (100 mg total) by mouth daily.    Dispense:  90 tablet    Refill:  3   pantoprazole  (PROTONIX ) 40 MG tablet    Sig: Take 1  tablet (40 mg total) by mouth daily.    Dispense:  90 tablet    Refill:  3    There are no Patient Instructions on file for this visit.   Signed, Aditri Louischarles, MD  04/08/2023 9:22 AM    [facility]

## 2023-04-06 ENCOUNTER — Ambulatory Visit: Payer: PPO | Admitting: Cardiology

## 2023-04-08 ENCOUNTER — Ambulatory Visit: Payer: PPO | Attending: Cardiology | Admitting: Cardiology

## 2023-04-08 VITALS — BP 168/80 | HR 64 | Ht 69.0 in | Wt 197.0 lb

## 2023-04-08 DIAGNOSIS — E7800 Pure hypercholesterolemia, unspecified: Secondary | ICD-10-CM | POA: Diagnosis not present

## 2023-04-08 DIAGNOSIS — R9439 Abnormal result of other cardiovascular function study: Secondary | ICD-10-CM

## 2023-04-08 DIAGNOSIS — I10 Essential (primary) hypertension: Secondary | ICD-10-CM

## 2023-04-08 DIAGNOSIS — I25118 Atherosclerotic heart disease of native coronary artery with other forms of angina pectoris: Secondary | ICD-10-CM | POA: Diagnosis not present

## 2023-04-08 MED ORDER — LOSARTAN POTASSIUM 100 MG PO TABS: 100.0000 mg | ORAL_TABLET | Freq: Every day | ORAL | 3 refills | Status: DC

## 2023-04-08 MED ORDER — PANTOPRAZOLE SODIUM 40 MG PO TBEC: 40.0000 mg | DELAYED_RELEASE_TABLET | Freq: Every day | ORAL | 3 refills | Status: AC

## 2023-04-08 MED ORDER — AMLODIPINE BESYLATE 5 MG PO TABS
5.0000 mg | ORAL_TABLET | Freq: Every day | ORAL | 3 refills | Status: DC
Start: 2023-04-08 — End: 2023-10-08

## 2023-04-08 NOTE — Patient Instructions (Signed)
 Medication Instructions:  Start Amlodipine  5 mg daily Continue all other medications *If you need a refill on your cardiac medications before your next appointment, please call your pharmacy*   Lab Work: None ordered   Testing/Procedures: None ordered   Follow-Up: At [facility], you and your health needs are our priority.  As part of our continuing mission to provide you with exceptional heart care, we have created designated Provider Care Teams.  These Care Teams include your primary Cardiologist (physician) and Advanced Practice Providers (APPs -  Physician Assistants and Nurse Practitioners) who all work together to provide you with the care you need, when you need it.  We recommend signing up for the patient portal called MyChart.  Sign up information is provided on this After Visit Summary.  MyChart is used to connect with patients for Virtual Visits (Telemedicine).  Patients are able to view lab/test results, encounter notes, upcoming appointments, etc.  Non-urgent messages can be sent to your provider as well.   To learn more about what you can do with MyChart, go to [email or web address].    Your next appointment:  6 months     Call in Oct to schedule Jan appointment     Provider:  Dr.Jordan

## 2023-04-14 DIAGNOSIS — Z01818 Encounter for other preprocedural examination: Secondary | ICD-10-CM | POA: Diagnosis not present

## 2023-04-14 DIAGNOSIS — H2513 Age-related nuclear cataract, bilateral: Secondary | ICD-10-CM | POA: Diagnosis not present

## 2023-04-14 DIAGNOSIS — H2512 Age-related nuclear cataract, left eye: Secondary | ICD-10-CM | POA: Diagnosis not present

## 2023-04-14 DIAGNOSIS — H353221 Exudative age-related macular degeneration, left eye, with active choroidal neovascularization: Secondary | ICD-10-CM | POA: Diagnosis not present

## 2023-04-15 ENCOUNTER — Encounter: Payer: Self-pay | Admitting: Family Medicine

## 2023-04-15 ENCOUNTER — Ambulatory Visit (INDEPENDENT_AMBULATORY_CARE_PROVIDER_SITE_OTHER): Payer: PPO | Admitting: Family Medicine

## 2023-04-15 VITALS — BP 136/68 | HR 64 | Temp 97.7°F | Ht 69.0 in | Wt 194.5 lb

## 2023-04-15 DIAGNOSIS — I25119 Atherosclerotic heart disease of native coronary artery with unspecified angina pectoris: Secondary | ICD-10-CM | POA: Diagnosis not present

## 2023-04-15 DIAGNOSIS — R7303 Prediabetes: Secondary | ICD-10-CM

## 2023-04-15 DIAGNOSIS — I10 Essential (primary) hypertension: Secondary | ICD-10-CM

## 2023-04-15 LAB — BASIC METABOLIC PANEL
BUN: 17 mg/dL (ref 6–23)
CO2: 28 meq/L (ref 19–32)
Calcium: 9.3 mg/dL (ref 8.4–10.5)
Chloride: 102 meq/L (ref 96–112)
Creatinine, Ser: 0.96 mg/dL (ref 0.40–1.50)
GFR: 74.54 mL/min (ref >60.00)
Glucose, Bld: 192 mg/dL — ABNORMAL HIGH (ref 70–99)
Potassium: 3.8 meq/L (ref 3.5–5.1)
Sodium: 139 meq/L (ref 135–145)

## 2023-04-15 LAB — URINALYSIS, ROUTINE W REFLEX MICROSCOPIC
Bilirubin Urine: NEGATIVE
Hgb urine dipstick: NEGATIVE
Ketones, ur: NEGATIVE
Leukocytes,Ua: NEGATIVE
Nitrite: NEGATIVE
RBC / HPF: NONE SEEN (ref 0–?)
Specific Gravity, Urine: 1.015 (ref 1.000–1.030)
Total Protein, Urine: NEGATIVE
Urine Glucose: >=1000 — AB
Urobilinogen, UA: 0.2 (ref 0.0–1.0)
pH: 6 (ref 5.0–8.0)

## 2023-04-15 LAB — LDL CHOLESTEROL, DIRECT: Direct LDL: 41 mg/dL

## 2023-04-15 LAB — MICROALBUMIN / CREATININE URINE RATIO
Creatinine,U: 60.9 mg/dL
Microalb Creat Ratio: 1.1 mg/g (ref 0.0–30.0)
Microalb, Ur: <0.7 mg/dL (ref 0.0–1.9)

## 2023-04-15 NOTE — Progress Notes (Signed)
Established Patient Office Visit   Subjective:  Patient ID: Steven Yates. Pound, male    DOB: 01/10/1941  Age: 83 y.o. MRN: [id number]  Chief Complaint  Patient presents with   Medical Management of Chronic Issues    10 week follow up. Pt is not fasting. A1C HTN    HPI Encounter Diagnoses  Name Primary?   Prediabetes Yes   Essential hypertension    Coronary artery disease involving native coronary artery of native heart with angina pectoris (HCC)    For follow-up of above.  Continues with empagliflozin and metformin for prediabetes.  Amaryl has been held.  Cardiology recently added amlodipine 5 mg for improved blood pressure control.  Status post eye check yesterday without diabetic retinopathy.  Scheduled for extraction of nuclear sclerosis.  LDL cholesterol back in September was 54.  Nonfasting today.   Review of Systems  Constitutional: Negative.   HENT: Negative.    Eyes:  Negative for blurred vision, discharge and redness.  Respiratory: Negative.    Cardiovascular: Negative.   Gastrointestinal:  Negative for abdominal pain.  Genitourinary: Negative.   Musculoskeletal: Negative.  Negative for myalgias.  Skin:  Negative for rash.  Neurological:  Negative for tingling, loss of consciousness and weakness.  Endo/Heme/Allergies:  Negative for polydipsia.     Current Outpatient Medications:    acetaminophen (TYLENOL) 650 MG CR tablet, Take 650 mg by mouth at bedtime., Disp: , Rfl:    amLODipine (NORVASC) 5 MG tablet, Take 1 tablet (5 mg total) by mouth daily., Disp: 180 tablet, Rfl: 3   aspirin EC 81 MG tablet, Take 81 mg by mouth daily. Swallow whole., Disp: , Rfl:    atorvastatin (LIPITOR) 40 MG tablet, Take 1 tablet (40 mg total) by mouth daily., Disp: 90 tablet, Rfl: 2   clopidogrel (PLAVIX) 75 MG tablet, Take 1 tablet (75 mg total) by mouth daily with breakfast., Disp: 90 tablet, Rfl: 1   empagliflozin (JARDIANCE) 10 MG TABS tablet, Take 1 tablet (10 mg total) by mouth  daily before breakfast., Disp: 90 tablet, Rfl: 1   Faricimab-svoa (VABYSMO IZ), by Intravitreal route every 8 (eight) weeks. One injection in left eye every 8 weeks., Disp: , Rfl:    losartan (COZAAR) 100 MG tablet, Take 1 tablet (100 mg total) by mouth daily., Disp: 90 tablet, Rfl: 3   metFORMIN (GLUCOPHAGE-XR) 500 MG 24 hr tablet, Take 2 tablets (1,000 mg total) by mouth 2 (two) times daily with a meal. (Patient taking differently: Take 1,000 mg by mouth daily.), Disp: 360 tablet, Rfl: 1   Multiple Vitamins-Minerals (ICAPS AREDS 2 PO), Take 1 capsule by mouth in the morning and at bedtime., Disp: , Rfl:    pantoprazole (PROTONIX) 40 MG tablet, Take 1 tablet (40 mg total) by mouth daily., Disp: 90 tablet, Rfl: 3   tamsulosin (FLOMAX) 0.4 MG CAPS capsule, Take 0.4 mg by mouth daily., Disp: , Rfl:    Objective:     BP 136/68   Pulse 64   Temp 97.7 F (36.5 C)   Ht 5\' 9"  (1.753 m)   Wt 194 lb 8 oz (88.2 kg)   SpO2 97%   BMI 28.72 kg/m  BP Readings from Last 3 Encounters:  04/15/23 136/68  04/08/23 (!) 168/80  01/28/23 (!) 140/72   Wt Readings from Last 3 Encounters:  04/15/23 194 lb 8 oz (88.2 kg)  04/08/23 197 lb (89.4 kg)  01/28/23 204 lb 6.4 oz (92.7 kg)      Physical  Exam Constitutional:      General: He is not in acute distress.    Appearance: Normal appearance. He is not ill-appearing, toxic-appearing or diaphoretic.  HENT:     Head: Normocephalic and atraumatic.     Right Ear: External ear normal.     Left Ear: External ear normal.  Eyes:     General: No scleral icterus.       Right eye: No discharge.        Left eye: No discharge.     Extraocular Movements: Extraocular movements intact.     Conjunctiva/sclera: Conjunctivae normal.  Pulmonary:     Effort: Pulmonary effort is normal. No respiratory distress.  Skin:    General: Skin is warm and dry.  Neurological:     Mental Status: He is alert and oriented to person, place, and time.  Psychiatric:        Mood  and Affect: Mood normal.        Behavior: Behavior normal.      No results found for any visits on 04/15/23.    The ASCVD Risk score (Arnett DK, et al., 2019) failed to calculate for the following reasons:   The 2019 ASCVD risk score is only valid for ages 35 to 7    Assessment & Plan:   Prediabetes -     Urinalysis, Routine w reflex microscopic -     Microalbumin / creatinine urine ratio -     Basic metabolic panel -     Hemoglobin A1c  Essential hypertension -     Basic metabolic panel  Coronary artery disease involving native coronary artery of native heart with angina pectoris (HCC) -     LDL cholesterol, direct    Return in about 6 months (around 10/13/2023).    Mliss Sax, MD

## 2023-04-16 ENCOUNTER — Other Ambulatory Visit (INDEPENDENT_AMBULATORY_CARE_PROVIDER_SITE_OTHER): Payer: PPO

## 2023-04-16 DIAGNOSIS — R7303 Prediabetes: Secondary | ICD-10-CM

## 2023-04-16 LAB — HEMOGLOBIN A1C: Hgb A1c MFr Bld: 6.4 % (ref 4.6–6.5)

## 2023-04-23 DIAGNOSIS — E1136 Type 2 diabetes mellitus with diabetic cataract: Secondary | ICD-10-CM | POA: Diagnosis not present

## 2023-04-23 DIAGNOSIS — I10 Essential (primary) hypertension: Secondary | ICD-10-CM | POA: Diagnosis not present

## 2023-04-23 DIAGNOSIS — H2512 Age-related nuclear cataract, left eye: Secondary | ICD-10-CM | POA: Diagnosis not present

## 2023-04-23 DIAGNOSIS — I2510 Atherosclerotic heart disease of native coronary artery without angina pectoris: Secondary | ICD-10-CM | POA: Diagnosis not present

## 2023-04-23 DIAGNOSIS — H25812 Combined forms of age-related cataract, left eye: Secondary | ICD-10-CM | POA: Diagnosis not present

## 2023-05-07 DIAGNOSIS — I10 Essential (primary) hypertension: Secondary | ICD-10-CM | POA: Diagnosis not present

## 2023-05-07 DIAGNOSIS — E1136 Type 2 diabetes mellitus with diabetic cataract: Secondary | ICD-10-CM | POA: Diagnosis not present

## 2023-05-07 DIAGNOSIS — H2511 Age-related nuclear cataract, right eye: Secondary | ICD-10-CM | POA: Diagnosis not present

## 2023-05-12 DIAGNOSIS — H353221 Exudative age-related macular degeneration, left eye, with active choroidal neovascularization: Secondary | ICD-10-CM | POA: Diagnosis not present

## 2023-06-09 DIAGNOSIS — H353221 Exudative age-related macular degeneration, left eye, with active choroidal neovascularization: Secondary | ICD-10-CM | POA: Diagnosis not present

## 2023-07-27 ENCOUNTER — Ambulatory Visit (INDEPENDENT_AMBULATORY_CARE_PROVIDER_SITE_OTHER): Admitting: Family Medicine

## 2023-07-27 ENCOUNTER — Encounter: Payer: Self-pay | Admitting: Family Medicine

## 2023-07-27 VITALS — BP 124/78 | HR 65 | Temp 97.5°F | Ht 69.0 in | Wt 193.6 lb

## 2023-07-27 DIAGNOSIS — R7303 Prediabetes: Secondary | ICD-10-CM | POA: Diagnosis not present

## 2023-07-27 DIAGNOSIS — E538 Deficiency of other specified B group vitamins: Secondary | ICD-10-CM

## 2023-07-27 DIAGNOSIS — S9031XD Contusion of right foot, subsequent encounter: Secondary | ICD-10-CM

## 2023-07-27 DIAGNOSIS — I10 Essential (primary) hypertension: Secondary | ICD-10-CM | POA: Diagnosis not present

## 2023-07-27 DIAGNOSIS — D539 Nutritional anemia, unspecified: Secondary | ICD-10-CM | POA: Diagnosis not present

## 2023-07-27 LAB — B12 AND FOLATE PANEL
Folate: 15.9 ng/mL (ref >5.9)
Vitamin B-12: 180 pg/mL — ABNORMAL LOW (ref 211–911)

## 2023-07-27 NOTE — Progress Notes (Addendum)
 Established Patient Office Visit   Subjective:  Patient ID: Steven Yates. Steven Yates, male    DOB: July 03, 1940  Age: 83 y.o. MRN: [id number]  Chief Complaint  Patient presents with   Medical Management of Chronic Issues    3 month follow up. Pt is  not fasting.    Foot Injury    Pt injured right foot 2 weeks ago. A wooden board fell on his foot. Pt did go to [place] and had Xrays not fractures, or broken bones.     Foot Injury  Pertinent negatives include no tingling.   Encounter Diagnoses  Name Primary?   Prediabetes Yes   Essential hypertension    Contusion of right foot, subsequent encounter    Macrocytic anemia    B12 deficiency    For follow-up of above.  Status post contusion of foot with creatinine board 4 weeks ago.  Board struck the patient's foot on the lateral side of the foot.  Seen at the [facility] hospital and x-rays were negative.  Swelling is slowly resolving.  And is doing better.  CBC with CMP was drawn at the [facility] on 28 March.  Hemoglobin of 13.1 MCV of 99.7.  A1c was 6.6.  GFR of 72.  Blood pressure controlled with losartan  100 mg.  Continues Jardiance  10 mg and metformin  1000 both daily.   Review of Systems  Constitutional: Negative.   HENT: Negative.    Eyes:  Negative for blurred vision, discharge and redness.  Respiratory: Negative.    Cardiovascular: Negative.   Gastrointestinal:  Negative for abdominal pain.  Genitourinary: Negative.   Musculoskeletal: Negative.  Negative for myalgias.  Skin:  Negative for rash.  Neurological:  Negative for tingling, loss of consciousness and weakness.  Endo/Heme/Allergies:  Negative for polydipsia.     Current Outpatient Medications:    aspirin  EC 81 MG tablet, Take 81 mg by mouth daily. Swallow whole., Disp: , Rfl:    atorvastatin  (LIPITOR) 40 MG tablet, Take 1 tablet (40 mg total) by mouth daily., Disp: 90 tablet, Rfl: 2   clopidogrel  (PLAVIX ) 75 MG tablet, Take 1 tablet (75 mg total) by mouth daily with breakfast., Disp: 90  tablet, Rfl: 1   cyanocobalamin  (VITAMIN B12) 1000 MCG tablet, Take 1 tablet (1,000 mcg total) by mouth daily., Disp: 90 tablet, Rfl: 1   empagliflozin  (JARDIANCE ) 10 MG TABS tablet, Take 1 tablet (10 mg total) by mouth daily before breakfast., Disp: 90 tablet, Rfl: 1   Faricimab -svoa (VABYSMO  IZ), by Intravitreal route every 8 (eight) weeks. One injection in left eye every 8 weeks., Disp: , Rfl:    losartan  (COZAAR ) 100 MG tablet, Take 1 tablet (100 mg total) by mouth daily., Disp: 90 tablet, Rfl: 3   metFORMIN  (GLUCOPHAGE -XR) 500 MG 24 hr tablet, Take 2 tablets (1,000 mg total) by mouth 2 (two) times daily with a meal. (Patient taking differently: Take 1,000 mg by mouth daily.), Disp: 360 tablet, Rfl: 1   Multiple Vitamins-Minerals (ICAPS AREDS 2 PO), Take 1 capsule by mouth in the morning and at bedtime., Disp: , Rfl:    pantoprazole  (PROTONIX ) 40 MG tablet, Take 1 tablet (40 mg total) by mouth daily., Disp: 90 tablet, Rfl: 3   tamsulosin (FLOMAX) 0.4 MG CAPS capsule, Take 0.4 mg by mouth daily., Disp: , Rfl:    acetaminophen  (TYLENOL ) 650 MG CR tablet, Take 650 mg by mouth at bedtime., Disp: , Rfl:    amLODipine  (NORVASC ) 5 MG tablet, Take 1 tablet (5 mg total) by  mouth daily., Disp: 180 tablet, Rfl: 3   Objective:     BP 124/78 (Cuff Size: Normal)   Pulse 65   Temp (!) 97.5 F (36.4 C) (Temporal)   Ht 5\' 9"  (1.753 m)   Wt 193 lb 9.6 oz (87.8 kg)   SpO2 97%   BMI 28.59 kg/m    Physical Exam Constitutional:      General: He is not in acute distress.    Appearance: Normal appearance. He is not ill-appearing, toxic-appearing or diaphoretic.  HENT:     Head: Normocephalic and atraumatic.     Right Ear: External ear normal.     Left Ear: External ear normal.  Eyes:     General: No scleral icterus.       Right eye: No discharge.        Left eye: No discharge.     Extraocular Movements: Extraocular movements intact.     Conjunctiva/sclera: Conjunctivae normal.  Pulmonary:      Effort: Pulmonary effort is normal. No respiratory distress.  Musculoskeletal:     Right foot: Swelling present. No deformity or laceration.       Legs:  Skin:    General: Skin is warm and dry.  Neurological:     Mental Status: He is alert and oriented to person, place, and time.  Psychiatric:        Mood and Affect: Mood normal.        Behavior: Behavior normal.      Results for orders placed or performed in visit on 07/27/23  B12 and Folate Panel  Result Value Ref Range   Vitamin B-12 180 (L) 211 - 911 pg/mL   Folate 15.9 >5.9 ng/mL      The ASCVD Risk score (Arnett DK, et al., 2019) failed to calculate for the following reasons:   The 2019 ASCVD risk score is only valid for ages 34 to 41    Assessment & Plan:   Prediabetes  Essential hypertension  Contusion of right foot, subsequent encounter  Macrocytic anemia -     B12 and Folate Panel -     Vitamin B-12; Take 1 tablet (1,000 mcg total) by mouth daily.  Dispense: 90 tablet; Refill: 1  B12 deficiency -     Vitamin B-12; Take 1 tablet (1,000 mcg total) by mouth daily.  Dispense: 90 tablet; Refill: 1    Return in about 3 months (around 10/26/2023).    Tonna Frederic, MD

## 2023-07-29 MED ORDER — VITAMIN B-12 1000 MCG PO TABS: 1000.0000 ug | ORAL_TABLET | Freq: Every day | ORAL | 1 refills | Status: AC

## 2023-07-29 NOTE — Addendum Note (Signed)
 Addended by: Delene Feinstein on: 07/29/2023 11:52 AM   Modules accepted: Orders

## 2023-08-11 DIAGNOSIS — H353221 Exudative age-related macular degeneration, left eye, with active choroidal neovascularization: Secondary | ICD-10-CM | POA: Diagnosis not present

## 2023-09-17 ENCOUNTER — Ambulatory Visit (INDEPENDENT_AMBULATORY_CARE_PROVIDER_SITE_OTHER): Admitting: Nurse Practitioner

## 2023-09-17 ENCOUNTER — Ambulatory Visit: Payer: Self-pay

## 2023-09-17 ENCOUNTER — Encounter: Payer: Self-pay | Admitting: Nurse Practitioner

## 2023-09-17 VITALS — BP 148/64 | HR 61 | Temp 99.2°F | Ht 69.0 in | Wt 193.4 lb

## 2023-09-17 DIAGNOSIS — J069 Acute upper respiratory infection, unspecified: Secondary | ICD-10-CM

## 2023-09-17 LAB — POC COVID19 BINAXNOW: SARS Coronavirus 2 Ag: NEGATIVE

## 2023-09-17 NOTE — Telephone Encounter (Signed)
 Noted.  Patient is scheduled with Lauren today at 920am.

## 2023-09-17 NOTE — Progress Notes (Signed)
 Acute Office Visit  Subjective:     Patient ID: Steven Yates, male    DOB: 07/08/1940, 83 y.o.   MRN: [id number]  Chief Complaint  Patient presents with   Sore Throat    With head congestion for 2 days    HPI Discussed the use of AI scribe software for clinical note transcription with the patient, who gave verbal consent to proceed.  History of Present Illness   Steven Yates is an 83 year old male who presents with nasal congestion and sore throat.  He has experienced nasal congestion and sore throat since Wednesday night, with a dripping sensation requiring frequent use of a napkin. There is no fever. Alka-Seltzer cold and flu tablets taken last night relieved nasal symptoms but caused significant throat dryness, which improved with coffee. He has a slight, aggravating headache and feels generally unwell with low energy. There is no ear pain or significant cough, only a dry hack. He works from home and has limited exposure to others, with no recent illness in his surroundings.      ROS See pertinent positives and negatives per HPI.     Objective:    BP (!) 148/64 (BP Location: Left Arm, Patient Position: Sitting, Cuff Size: Normal) Comment: took BP med. 30 min. prior  Pulse 61   Temp 99.2 F (37.3 C)   Ht 5' 9 (1.753 m)   Wt 193 lb 6.4 oz (87.7 kg)   SpO2 97%   BMI 28.56 kg/m    Physical Exam Vitals and nursing note reviewed.  Constitutional:      Appearance: Normal appearance.  HENT:     Head: Normocephalic.     Right Ear: Tympanic membrane, ear canal and external ear normal.     Left Ear: Tympanic membrane, ear canal and external ear normal.     Mouth/Throat:     Mouth: Mucous membranes are moist.     Pharynx: Posterior oropharyngeal erythema (slight) present.   Eyes:     Conjunctiva/sclera: Conjunctivae normal.    Cardiovascular:     Rate and Rhythm: Normal rate and regular rhythm.     Pulses: Normal pulses.     Heart sounds: Normal  heart sounds.  Pulmonary:     Effort: Pulmonary effort is normal.     Breath sounds: Normal breath sounds.   Musculoskeletal:     Cervical back: Normal range of motion and neck supple. No tenderness.  Lymphadenopathy:     Cervical: No cervical adenopathy.   Skin:    General: Skin is warm.   Neurological:     General: No focal deficit present.     Mental Status: He is alert and oriented to person, place, and time.   Psychiatric:        Mood and Affect: Mood normal.        Behavior: Behavior normal.        Thought Content: Thought content normal.        Judgment: Judgment normal.     Results for orders placed or performed in visit on 09/17/23  POC COVID-19 BinaxNow  Result Value Ref Range   SARS Coronavirus 2 Ag Negative Negative        Assessment & Plan:   Upper Respiratory Tract Infection   Acute nasal congestion and sore throat with a negative COVID-19 test suggest a viral etiology. No antibiotics are needed. Continue Alka-Seltzer cold and flu tablets as needed, monitoring for throat dryness. Increase fluid intake and  advise rest. Return if symptoms worsen.    No orders of the defined types were placed in this encounter.   Return if symptoms worsen or fail to improve.  Odette Benjamin, NP

## 2023-09-17 NOTE — Patient Instructions (Signed)
It was great to see you!  Your symptoms and exam findings are most consistent with a viral upper respiratory infection. These usually run their course in 5-7 days. Unfortunately, antibiotics don't work against viruses and just increase your risk of other issues such as diarrhea, yeast infections, and resistant infections.  If you start feeling worse with facial pain, high fever, cough, shortness of breath or start feeling significantly worse, please call us right away to be further evaluated.  Some things that can make you feel better are: - Increased rest - Increasing Fluids - Acetaminophen / ibuprofen as needed for fever/pain.  - Salt water gargling, chloraseptic spray and throat lozenges - OTC pseudoephedrine or coricidin if you have a history of high blood pressure or take blood pressure medications - Mucinex.  - Saline sinus flushes or a neti pot.  - Humidifying the air.  Let's follow-up if your symptoms worsen or don't improve.   Take care,  Anner Baity, NP  

## 2023-09-17 NOTE — Telephone Encounter (Signed)
 FYI Only or Action Required?: FYI only for provider.  Patient was last seen in primary care on 07/27/2023 by Steven Frederic, MD. Called Nurse Triage reporting Sore Throat. Symptoms began x 3 days. Interventions attempted: OTC medications: alkalizer. Symptoms are: gradually worsening.  Triage Disposition: See Physician Within 24 Hours  Patient/caregiver understands and will follow disposition?: Yes    Copied from CRM [id number]. Topic: Clinical - Red Word Triage >> Sep 17, 2023  8:28 AM [name] E wrote: [facility] that prompted transfer to Nurse Triage: Throat pain. Patient stated his throat has been hurting for the past day and experiencing nasal congestion. Rated pain an 8 out of 10. Reason for Disposition  Fever present > 3 days (72 hours)  SEVERE (e.g., excruciating) throat pain  Answer Assessment - Initial Assessment Questions 1. ONSET: When did the throat start hurting? (Hours or days ago)      X 3 days 2. SEVERITY: How bad is the sore throat? (Scale 1-10; mild, moderate or severe)   - MILD (1-3):  Doesn't interfere with eating or normal activities.   - MODERATE (4-7): Interferes with eating some solids and normal activities.   - SEVERE (8-10):  Excruciating pain, interferes with most normal activities.   - SEVERE WITH DYSPHAGIA (10): Can't swallow liquids, drooling.     8/10 3. STREP EXPOSURE: Has there been any exposure to strep within the past week? If Yes, ask: What type of contact occurred?      N/a 4.  VIRAL SYMPTOMS: Are there any symptoms of a cold, such as a runny nose, cough, hoarse voice or red eyes?      Hoarse, cough,congestion, headache,  5. FEVER: Do you have a fever? If Yes, ask: What is your temperature, how was it measured, and when did it start?     no 6. PUS ON THE TONSILS: Is there pus on the tonsils in the back of your throat?     no 7. OTHER SYMPTOMS: Do you have any other symptoms? (e.g., difficulty breathing, headache, rash)      headache 8. PREGNANCY: Is there any chance you are pregnant? When was your last menstrual period?     no  Answer Assessment - Initial Assessment Questions 1. LOCATION: Where does it hurt?      Headache  2. ONSET: When did the sinus pain start?  (e.g., hours, days)      X couple of day 3. SEVERITY: How bad is the pain?   (Scale 1-10; mild, moderate or severe)   - MILD (1-3): doesn't interfere with normal activities    - MODERATE (4-7): interferes with normal activities (e.g., work or school) or awakens from sleep   - SEVERE (8-10): excruciating pain and patient unable to do any normal activities        5/10 4. RECURRENT SYMPTOM: Have you ever had sinus problems before? If Yes, ask: When was the last time? and What happened that time?      N/a 5. NASAL CONGESTION: Is the nose blocked? If Yes, ask: Can you open it or must you breathe through your mouth?     yes 6. NASAL DISCHARGE: Do you have discharge from your nose? If so ask, What color?     clear 7. FEVER: Do you have a fever? If Yes, ask: What is it, how was it measured, and when did it start?      no 8. OTHER SYMPTOMS: Do you have any other symptoms? (e.g., sore throat, cough,  earache, difficulty breathing)     Cough, sore throat, runny nose, headache 9. PREGNANCY: Is there any chance you are pregnant? When was your last menstrual period?     N/a  Protocols used: Sore Throat-A-AH, Sinus Pain or Congestion-A-AH

## 2023-09-20 DIAGNOSIS — M65351 Trigger finger, right little finger: Secondary | ICD-10-CM | POA: Diagnosis not present

## 2023-09-20 DIAGNOSIS — Z4789 Encounter for other orthopedic aftercare: Secondary | ICD-10-CM | POA: Diagnosis not present

## 2023-09-20 DIAGNOSIS — M65341 Trigger finger, right ring finger: Secondary | ICD-10-CM | POA: Diagnosis not present

## 2023-10-04 NOTE — Progress Notes (Signed)
 Cardiology Office Note:    Date:  10/08/2023   ID:  Steven Yates. Yates, Steven [birth year], MRN [id number]  PCP:  Berneta Elsie Sayre, MD  Cardiologist:  Angelgabriel Willmore [place], MD  Electrophysiologist:  None   Referring MD: Berneta Elsie Sayre,*   Chief Complaint  Patient presents with   Coronary Artery Disease    History of Present Illness:    Steven Yates. Cona is a 83 y.o. male seen for evaluation of SOB. He has a hx of CAD status post CTO PCI to LAD with DES x 2 in 2016, hypertension, T2DM, hyperlipidemia. He is a former patient of Dr. Claudene.    Most recent cath 09/01/2016 showed mild to moderate in-stent restenosis in LAD stent, mild nonobstructive disease in LCx and RCA.  He was seen in Jan 2024 by Dr Kate - noted dyspnea at that time. Echo was normal and Myoview  was low risk. Dr Kate reviewed and did not see evidence of ischemia. Significantly improved from 2015.   When I saw him in July 2024 he was doing well. In October he presented with worsening DOE. He underwent repeat cardiac cath showing severe focal in stent restenosis at the ostium of the LAD. This was treated with scoring balloon PCI with IVUS guidance. On follow up post procedure he noted a significant improvement.   On follow up today he is feeling well. He states he has some SOB every once in awhile but nothing like prior to his PCI. No chest pain. Does not monitor BP at home although has a cuff. Still working. Does some yard work. Just got over a case of bronchitis. States he quit taking amlodipine  because it made him sick.   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 Claudene   CARDIAC CATHETERIZATION  04/04/2014   Procedure: CORONARY/BYPASS GRAFT CTO INTERVENTION;  Surgeon: Dequarius Jeffries M [place], MD;  Location: [facility] CATH LAB;  Service: Cardiovascular;;   CARPAL TUNNEL RELEASE     bil   2019   CHOLECYSTECTOMY  2010   CORONARY ANGIOPLASTY WITH STENT PLACEMENT  04/04/2014   2   CORONARY BALLOON ANGIOPLASTY N/A 01/19/2023   Procedure: CORONARY BALLOON ANGIOPLASTY;  Surgeon: [place], Adonia Porada M, MD;  Location: [facility] INVASIVE CV LAB;  Service: Cardiovascular;  Laterality: N/A;   CORONARY PRESSURE/FFR STUDY N/A 09/01/2016   Procedure: Intravascular Pressure Wire/FFR Study;  Surgeon: Claudene Victory ORN, MD;  Location: [facility] INVASIVE CV LAB;  Service: Cardiovascular;  Laterality: N/A;   CORONARY ULTRASOUND/IVUS N/A 01/19/2023   Procedure: Coronary Ultrasound/IVUS;  Surgeon: [place], Furqan Gosselin M, MD;  Location: [facility] INVASIVE CV LAB;  Service: Cardiovascular;  Laterality: N/A;   INGUINAL HERNIA REPAIR Bilateral 1966?   LEFT HEART CATH AND CORONARY ANGIOGRAPHY N/A 09/01/2016   Procedure: Left Heart Cath and Coronary Angiography;  Surgeon: Claudene Victory ORN, MD;  Location: [facility] INVASIVE CV LAB;  Service: Cardiovascular;  Laterality: N/A;   LEFT HEART CATH AND CORONARY ANGIOGRAPHY N/A 01/19/2023   Procedure: LEFT HEART CATH AND CORONARY ANGIOGRAPHY;  Surgeon: [place], Jalei Shibley M, MD;  Location: [facility] INVASIVE CV LAB;  Service: Cardiovascular;  Laterality: N/A;   LEFT HEART CATHETERIZATION WITH CORONARY ANGIOGRAM N/A 01/30/2014   Procedure: LEFT HEART CATHETERIZATION WITH CORONARY ANGIOGRAM;  Surgeon: Victory LELON Claudene DOUGLAS, MD;  Location: [facility] CATH LAB;  Service: Cardiovascular;  Laterality: N/A;   LUMBAR DISC SURGERY  1980's X 2   ruptured disc   PERCUTANEOUS CORONARY STENT INTERVENTION (PCI-S)  01/30/2014   Procedure: PERCUTANEOUS CORONARY STENT INTERVENTION (PCI-S);  Surgeon: Victory LELON Claudene DOUGLAS, MD;  Location: [facility] CATH LAB;  Service: Cardiovascular;;    SHOULDER OPEN ROTATOR CUFF REPAIR Right 1990's   TOTAL KNEE ARTHROPLASTY Left 06/06/2018   Procedure: TOTAL KNEE ARTHROPLASTY;  Surgeon: Melodi Lerner, MD;  Location: [facility] ORS;  Service: Orthopedics;  Laterality: Left;    ULTRASOUND GUIDANCE FOR VASCULAR ACCESS  09/01/2016   Procedure: Ultrasound Guidance For Vascular Access;  Surgeon: Claudene Victory LELON, MD;  Location: [facility] INVASIVE CV LAB;  Service: Cardiovascular;;    Current Medications: Current Meds  Medication Sig   acetaminophen  (TYLENOL ) 650 MG CR tablet Take 650 mg by mouth at bedtime.   aspirin  EC 81 MG tablet Take 81 mg by mouth daily. Swallow whole.   atorvastatin  (LIPITOR) 40 MG tablet Take 1 tablet (40 mg total) by mouth daily.   cyanocobalamin  (VITAMIN B12) 1000 MCG tablet Take 1 tablet (1,000 mcg total) by mouth daily.   empagliflozin  (JARDIANCE ) 10 MG TABS tablet Take 1 tablet (10 mg total) by mouth daily before breakfast.   Faricimab -svoa (VABYSMO  IZ) by Intravitreal route every 8 (eight) weeks. One injection in left eye every 8 weeks.   losartan  (COZAAR ) 100 MG tablet Take 1 tablet (100 mg total) by mouth daily.   metFORMIN  (GLUCOPHAGE -XR) 500 MG 24 hr tablet Take 2 tablets (1,000 mg total) by mouth 2 (two) times daily with a meal. (Patient taking differently: Take 1,000 mg by mouth daily.)   Multiple Vitamins-Minerals (ICAPS AREDS 2 PO) Take 1 capsule by mouth in the morning and at bedtime.   ofloxacin (OCUFLOX) 0.3 % ophthalmic solution INSTILL 1 DROP INTO THE RIGHT EYE FOUR TIMES DAILY FOR 1 WEEK   pantoprazole  (PROTONIX ) 40 MG tablet Take 1 tablet (40 mg total) by mouth daily.   tamsulosin (FLOMAX) 0.4 MG CAPS capsule Take 0.4 mg by mouth daily.   [DISCONTINUED] clopidogrel  (PLAVIX ) 75 MG tablet Take 1 tablet (75 mg total) by mouth daily with breakfast.     Allergies:   Lisinopril  and Tape   Social History   Socioeconomic History   Marital status: Married    Spouse name: Not on file   Number of children: 2   Years  of education: Not on file   Highest education level: Some college, no degree  Occupational History   Occupation: [employer]  Tobacco Use   Smoking status: Former    Types: Cigars    Quit date: 04/29/2018    Years since quitting: 5.4   Smokeless tobacco: Never  Vaping Use   Vaping status: Never Used  Substance and Sexual Activity   Alcohol use: No    Alcohol/week: 0.0 standard drinks of alcohol   Drug use: No   Sexual activity: Yes  Other Topics Concern   Not on file  Social History Narrative   Not on file   Social Drivers of Health   Financial Resource Strain: Low Risk  (01/27/2023)   Overall Financial Resource Strain (CARDIA)    Difficulty of Paying  Living Expenses: Not very hard  Food Insecurity: No Food Insecurity (04/12/2023)   Hunger Vital Sign    Worried About Running Out of Food in the Last Year: Never true    Ran Out of Food in the Last Year: Never true  Transportation Needs: No Transportation Needs (04/12/2023)   PRAPARE - Administrator, Civil Service (Medical): No    Lack of Transportation (Non-Medical): No  Physical Activity: Insufficiently Active (04/12/2023)   Exercise Vital Sign    Days of Exercise per Week: 3 days    Minutes of Exercise per Session: 20 min  Stress: No Stress Concern Present (04/12/2023)   [employer] of Occupational Health - Occupational Stress Questionnaire    Feeling of Stress : Not at all  Social Connections: Moderately Integrated (04/12/2023)   Social Connection and Isolation Panel    Frequency of Communication with Friends and Family: More than three times a week    Frequency of Social Gatherings with Friends and Family: Twice a week    Attends Religious Services: More than 4 times per year    Active Member of [employer] or Organizations: No    Attends Engineer, structural: Not on file    Marital Status: Married     Family History: The patient's family history includes Cancer in his brother; Emphysema  in his father; Heart disease in his brother and mother; Other in his sister.  ROS:   Please see the history of present illness.     All other systems reviewed and are negative.  EKGs/Labs/Other Studies Reviewed:    The following studies were reviewed today:  Echo 04/24/22: IMPRESSIONS     1. Left ventricular ejection fraction, by estimation, is 60 to 65%. The  left ventricle has normal function. The left ventricle has no regional  wall motion abnormalities. Left ventricular diastolic parameters are  consistent with Grade I diastolic  dysfunction (impaired relaxation).   2. Right ventricular systolic function is normal. The right ventricular  size is normal. There is normal pulmonary artery systolic pressure.   3. Left atrial size was mildly dilated.   4. The mitral valve is normal in structure. Trivial mitral valve  regurgitation. No evidence of mitral stenosis.   5. The aortic valve is tricuspid. Aortic valve regurgitation is not  visualized. No aortic stenosis is present.   6. The inferior vena cava is normal in size with greater than 50%  respiratory variability, suggesting right atrial pressure of 3 mmHg.   Myoview  04/24/22: Study Highlights      Findings are consistent with infarction with peri-infarct ischemia. The study is intermediate risk.   No ST deviation was noted.   Left ventricular function is normal. Nuclear stress EF: 56 %. The left ventricular ejection fraction is normal (55-65%). End diastolic cavity size is normal.   Prior study available for comparison from 01/29/2014.   small size and mild intensity apical/apical anterior fixed lesion c/w possible scar with peri-infarct ischemia.  Prior nuclear study was low risk, do not have the images to compare.    Cardiac cath 01/19/23:  LEFT HEART CATH AND CORONARY ANGIOGRAPHY  CORONARY BALLOON ANGIOPLASTY  Coronary Ultrasound/IVUS   Conclusion      Prox RCA lesion is 30% stenosed.   Prox LAD lesion is 90%  stenosed.   Previously placed Prox LAD to Mid LAD stent of unknown type is  widely patent.   Scoring balloon angioplasty was performed using a BALLN SCOREFLEX 3.0X10.   Post  intervention, there is a 0% residual stenosis.   LV end diastolic pressure is severely elevated.   Recommend uninterrupted dual antiplatelet therapy with Aspirin  81mg  daily and Clopidogrel  75mg  daily for a minimum of 6 months (stable ischemic heart disease-Class I recommendation).   Single vessel obstructive CAD involving the proximal LAD with focal in stent restenosis High LVEDP Successful PCI of the proximal LAD with scoring balloon with IVUS guidance   Plan: DAPT for 6 months with ASA and Plavix . Needs diuresis. Strongly consider adding SGLT 2 inhibitor. Patient is a candidate for same day DC  Coronary Diagrams  Diagnostic Dominance: Right  Intervention           Recent Labs: 01/07/2023: BNP 80.5; Hemoglobin 11.3; Platelets 147 04/15/2023: BUN 17; Creatinine, Ser 0.96; Potassium 3.8; Sodium 139  Recent Lipid Panel    Component Value Date/Time   CHOL 104 01/21/2022 0856   TRIG 116.0 01/21/2022 0856   HDL 34.70 (L) 01/21/2022 0856   CHOLHDL 3 01/21/2022 0856   VLDL 23.2 01/21/2022 0856   LDLCALC 46 01/21/2022 0856   LDLDIRECT 41.0 04/15/2023 0938    Physical Exam:    VS:  BP 138/66   Pulse 74   Ht 5' 9.5 (1.765 m)   Wt 189 lb 3.2 oz (85.8 kg)   SpO2 98%   BMI 27.54 kg/m     Wt Readings from Last 3 Encounters:  10/08/23 189 lb 3.2 oz (85.8 kg)  09/17/23 193 lb 6.4 oz (87.7 kg)  07/27/23 193 lb 9.6 oz (87.8 kg)     GEN:  Well nourished, well developed in no acute distress HEENT: Normal NECK: No JVD; No carotid bruits LYMPHATICS: No lymphadenopathy CARDIAC: RRR, no murmurs, rubs, gallops RESPIRATORY:  Clear to auscultation without rales, wheezing or rhonchi  ABDOMEN: Soft, non-tender, non-distended MUSCULOSKELETAL:  No edema; No deformity  SKIN: Warm and dry NEUROLOGIC:  Alert and  oriented x 3 PSYCHIATRIC:  Normal affect   ASSESSMENT:    1. Coronary artery disease of native artery of native heart with stable angina pectoris (HCC)   2. Essential hypertension   3. Pure hypercholesterolemia       PLAN:    1.   CAD:  status post CTO PCI to LAD with DES x 2 in 2016.  Cath 09/01/2016 showed mild to moderate in-stent restenosis in LAD stent, mild nonobstructive disease in LCx and RCA - Myoview  in Jan 2024 with small scar minimal ischemia. Low risk. Echo normal - progressive DOE in October 2024 with severe focal in stent restenosis in the ostial LAD treated with Scoring balloon angioplasty. Excellent response with resolution of symptoms. OK to discontinue Plavix  at this point.   2.   Hypertension: On losartan  100 mg daily. BP is mildly elevated today. Intolerant of amlodipine . I asked him to keep a diary of BP at home so when he sees his PCP back can review and see if additional therapy needed.   3.   Hyperlipidemia: On atorvastatin  40mg  daily.  LDL 41   Follow-up in 6 months.  No orders of the defined types were placed in this encounter.  No orders of the defined types were placed in this encounter.   There are no Patient Instructions on file for this visit.   Signed, Johnattan Strassman [place], MD  10/08/2023 9:18 AM    [facility]

## 2023-10-08 ENCOUNTER — Encounter: Payer: Self-pay | Admitting: Cardiology

## 2023-10-08 ENCOUNTER — Ambulatory Visit: Attending: Cardiology | Admitting: Cardiology

## 2023-10-08 ENCOUNTER — Other Ambulatory Visit (HOSPITAL_COMMUNITY): Payer: Self-pay

## 2023-10-08 VITALS — BP 138/66 | HR 74 | Ht 69.5 in | Wt 189.2 lb

## 2023-10-08 DIAGNOSIS — E7800 Pure hypercholesterolemia, unspecified: Secondary | ICD-10-CM

## 2023-10-08 DIAGNOSIS — I25118 Atherosclerotic heart disease of native coronary artery with other forms of angina pectoris: Secondary | ICD-10-CM

## 2023-10-08 DIAGNOSIS — I10 Essential (primary) hypertension: Secondary | ICD-10-CM

## 2023-10-08 NOTE — Patient Instructions (Addendum)
 Medication Instructions:  Stop Plavix  Continue all other medications *If you need a refill on your cardiac medications before your next appointment, please call your pharmacy*  Lab Work: None ordered  Testing/Procedures: None ordered  Follow-Up: At [facility], you and your health needs are our priority.  As part of our continuing mission to provide you with exceptional heart care, our providers are all part of one team.  This team includes your primary Cardiologist (physician) and Advanced Practice Providers or APPs (Physician Assistants and Nurse Practitioners) who all work together to provide you with the care you need, when you need it.  Your next appointment:  6 months      Call in Sept to schedule Jan appointment     Provider:  Dr.Jordan        Check blood pressure daily and keep a diary Take readings to your PCP   We recommend signing up for the patient portal called MyChart.  Sign up information is provided on this After Visit Summary.  MyChart is used to connect with patients for Virtual Visits (Telemedicine).  Patients are able to view lab/test results, encounter notes, upcoming appointments, etc.  Non-urgent messages can be sent to your provider as well.   To learn more about what you can do with MyChart, go to [email or web address].

## 2023-10-15 ENCOUNTER — Ambulatory Visit: Payer: PPO | Admitting: Family Medicine

## 2023-10-20 DIAGNOSIS — H353221 Exudative age-related macular degeneration, left eye, with active choroidal neovascularization: Secondary | ICD-10-CM | POA: Diagnosis not present

## 2023-10-26 ENCOUNTER — Ambulatory Visit (INDEPENDENT_AMBULATORY_CARE_PROVIDER_SITE_OTHER): Admitting: Family Medicine

## 2023-10-26 ENCOUNTER — Encounter: Payer: Self-pay | Admitting: Family Medicine

## 2023-10-26 ENCOUNTER — Other Ambulatory Visit (HOSPITAL_COMMUNITY): Payer: Self-pay

## 2023-10-26 VITALS — BP 126/78 | HR 58 | Temp 96.5°F | Ht 69.0 in | Wt 190.4 lb

## 2023-10-26 DIAGNOSIS — R7303 Prediabetes: Secondary | ICD-10-CM | POA: Diagnosis not present

## 2023-10-26 DIAGNOSIS — E7800 Pure hypercholesterolemia, unspecified: Secondary | ICD-10-CM

## 2023-10-26 DIAGNOSIS — E538 Deficiency of other specified B group vitamins: Secondary | ICD-10-CM

## 2023-10-26 DIAGNOSIS — D539 Nutritional anemia, unspecified: Secondary | ICD-10-CM

## 2023-10-26 DIAGNOSIS — I10 Essential (primary) hypertension: Secondary | ICD-10-CM | POA: Diagnosis not present

## 2023-10-26 LAB — CBC WITH DIFFERENTIAL/PLATELET
Basophils Absolute: 0 K/uL (ref 0.0–0.1)
Basophils Relative: 0.3 % (ref 0.0–3.0)
Eosinophils Absolute: 0 K/uL (ref 0.0–0.7)
Eosinophils Relative: 0.8 % (ref 0.0–5.0)
HCT: 37.1 % — ABNORMAL LOW (ref 39.0–52.0)
Hemoglobin: 12.5 g/dL — ABNORMAL LOW (ref 13.0–17.0)
Lymphocytes Relative: 34.6 % (ref 12.0–46.0)
Lymphs Abs: 1.8 K/uL (ref 0.7–4.0)
MCHC: 33.6 g/dL (ref 30.0–36.0)
MCV: 104.7 fl — ABNORMAL HIGH (ref 78.0–100.0)
Monocytes Absolute: 0.5 K/uL (ref 0.1–1.0)
Monocytes Relative: 9.8 % (ref 3.0–12.0)
Neutro Abs: 2.8 K/uL (ref 1.4–7.7)
Neutrophils Relative %: 54.5 % (ref 43.0–77.0)
Platelets: 132 K/uL — ABNORMAL LOW (ref 150.0–400.0)
RBC: 3.54 Mil/uL — ABNORMAL LOW (ref 4.22–5.81)
RDW: 17.7 % — ABNORMAL HIGH (ref 11.5–15.5)
WBC: 5.1 K/uL (ref 4.0–10.5)

## 2023-10-26 LAB — LIPID PANEL
Cholesterol: 95 mg/dL (ref 0–200)
HDL: 33.6 mg/dL — ABNORMAL LOW (ref >39.00)
LDL Cholesterol: 40 mg/dL (ref 0–99)
NonHDL: 61.26
Total CHOL/HDL Ratio: 3
Triglycerides: 108 mg/dL (ref 0.0–149.0)
VLDL: 21.6 mg/dL (ref 0.0–40.0)

## 2023-10-26 LAB — COMPREHENSIVE METABOLIC PANEL WITH GFR
ALT: 16 U/L (ref 0–53)
AST: 14 U/L (ref 0–37)
Albumin: 4.6 g/dL (ref 3.5–5.2)
Alkaline Phosphatase: 44 U/L (ref 39–117)
BUN: 15 mg/dL (ref 6–23)
CO2: 25 meq/L (ref 19–32)
Calcium: 9.3 mg/dL (ref 8.4–10.5)
Chloride: 102 meq/L (ref 96–112)
Creatinine, Ser: 0.96 mg/dL (ref 0.40–1.50)
GFR: 74.26 mL/min (ref >60.00)
Glucose, Bld: 160 mg/dL — ABNORMAL HIGH (ref 70–99)
Potassium: 4.1 meq/L (ref 3.5–5.1)
Sodium: 137 meq/L (ref 135–145)
Total Bilirubin: 1.2 mg/dL (ref 0.2–1.2)
Total Protein: 6.9 g/dL (ref 6.0–8.3)

## 2023-10-26 LAB — HEMOGLOBIN A1C: Hgb A1c MFr Bld: 6.9 % — ABNORMAL HIGH (ref 4.6–6.5)

## 2023-10-26 MED ORDER — METFORMIN HCL ER 500 MG PO TB24
1000.0000 mg | ORAL_TABLET | Freq: Two times a day (BID) | ORAL | 3 refills | Status: AC
  Filled 2023-10-26 – 2024-01-31 (×4): qty 360, 90d supply, fill #0

## 2023-10-26 MED ORDER — LOSARTAN POTASSIUM 100 MG PO TABS
100.0000 mg | ORAL_TABLET | Freq: Every day | ORAL | 3 refills | Status: AC
  Filled 2023-10-26 – 2024-01-31 (×3): qty 90, 90d supply, fill #0

## 2023-10-26 NOTE — Progress Notes (Signed)
 Established Patient Office Visit   Subjective:  Patient ID: Steven Yates, male    DOB: 02-Sep-1940  Age: 83 y.o. MRN: [id number]  Chief Complaint  Patient presents with   Medical Management of Chronic Issues    3 month follow up. Pt is not fasting. No concerns.      HPI Encounter Diagnoses  Name Primary?   B12 deficiency Yes   Essential hypertension    Prediabetes    Macrocytic anemia    Elevated cholesterol    For follow-up of the above.  Blood pressure well-controlled losartan .  History of elevated cholesterol with history of coronary artery disease treated with atorvastatin  40 mg daily.  Prediabetes treated with empagliflozin  and metformin  1000 twice daily.  Macrocytic anemia with B12 deficiency treated with cyanocobalamin  mean 1000 mcg daily.   Review of Systems  Constitutional: Negative.   HENT: Negative.    Eyes:  Negative for blurred vision, discharge and redness.  Respiratory: Negative.    Cardiovascular: Negative.   Gastrointestinal:  Negative for abdominal pain.  Genitourinary: Negative.   Musculoskeletal: Negative.  Negative for myalgias.  Skin:  Negative for rash.  Neurological:  Negative for tingling, loss of consciousness and weakness.  Endo/Heme/Allergies:  Negative for polydipsia.     Current Outpatient Medications:    acetaminophen  (TYLENOL ) 650 MG CR tablet, Take 650 mg by mouth at bedtime., Disp: , Rfl:    aspirin  EC 81 MG tablet, Take 81 mg by mouth daily. Swallow whole., Disp: , Rfl:    atorvastatin  (LIPITOR) 40 MG tablet, Take 1 tablet (40 mg total) by mouth daily., Disp: 90 tablet, Rfl: 2   cyanocobalamin  (VITAMIN B12) 1000 MCG tablet, Take 1 tablet (1,000 mcg total) by mouth daily., Disp: 90 tablet, Rfl: 1   empagliflozin  (JARDIANCE ) 10 MG TABS tablet, Take 1 tablet (10 mg total) by mouth daily before breakfast., Disp: 90 tablet, Rfl: 1   Faricimab -svoa (VABYSMO  IZ), by Intravitreal route every 8 (eight) weeks. One injection in left eye every  8 weeks., Disp: , Rfl:    Multiple Vitamins-Minerals (ICAPS AREDS 2 PO), Take 1 capsule by mouth in the morning and at bedtime., Disp: , Rfl:    pantoprazole  (PROTONIX ) 40 MG tablet, Take 1 tablet (40 mg total) by mouth daily., Disp: 90 tablet, Rfl: 3   tamsulosin (FLOMAX) 0.4 MG CAPS capsule, Take 0.4 mg by mouth daily., Disp: , Rfl:    losartan  (COZAAR ) 100 MG tablet, Take 1 tablet (100 mg total) by mouth daily., Disp: 90 tablet, Rfl: 3   metFORMIN  (GLUCOPHAGE -XR) 500 MG 24 hr tablet, Take 2 tablets (1,000 mg total) by mouth 2 (two) times daily with a meal., Disp: 360 tablet, Rfl: 3   ofloxacin (OCUFLOX) 0.3 % ophthalmic solution, INSTILL 1 DROP INTO THE RIGHT EYE FOUR TIMES DAILY FOR 1 WEEK, Disp: , Rfl:    Objective:     BP 126/78 (Cuff Size: Normal)   Pulse (!) 58   Temp (!) 96.5 F (35.8 C) (Temporal)   Ht 5' 9 (1.753 m)   Wt 190 lb 6.4 oz (86.4 kg)   SpO2 98%   BMI 28.12 kg/m    Physical Exam Constitutional:      General: He is not in acute distress.    Appearance: Normal appearance. He is not ill-appearing, toxic-appearing or diaphoretic.  HENT:     Head: Normocephalic and atraumatic.     Right Ear: External ear normal.     Left Ear: External ear normal.  Eyes:     General: No scleral icterus.       Right eye: No discharge.        Left eye: No discharge.     Extraocular Movements: Extraocular movements intact.     Conjunctiva/sclera: Conjunctivae normal.  Cardiovascular:     Pulses:          Dorsalis pedis pulses are 1+ on the right side and 1+ on the left side.       Posterior tibial pulses are 1+ on the right side and 1+ on the left side.  Pulmonary:     Effort: Pulmonary effort is normal. No respiratory distress.  Skin:    General: Skin is warm and dry.  Neurological:     Mental Status: He is alert and oriented to person, place, and time.  Psychiatric:        Mood and Affect: Mood normal.        Behavior: Behavior normal.    Diabetic Foot Exam - Simple    Simple Foot Form Diabetic Foot exam was performed with the following findings: Yes 10/26/2023  9:55 AM  Visual Inspection See comments: Yes Sensation Testing Intact to touch and monofilament testing bilaterally: Yes Pulse Check Posterior Tibialis and Dorsalis pulse intact bilaterally: Yes Comments Feet are cavus bilaterally.  There are no lesions or ulcerations.  There is hammering of all the toes of the right foot.       No results found for any visits on 10/26/23.    The ASCVD Risk score (Arnett DK, et al., 2019) failed to calculate for the following reasons:   The 2019 ASCVD risk score is only valid for ages 64 to 89    Assessment & Plan:   B12 deficiency -     Vitamin B12  Essential hypertension -     Losartan  Potassium; Take 1 tablet (100 mg total) by mouth daily.  Dispense: 90 tablet; Refill: 3  Prediabetes -     Hemoglobin A1c -     metFORMIN  HCl ER; Take 2 tablets (1,000 mg total) by mouth 2 (two) times daily with a meal.  Dispense: 360 tablet; Refill: 3 -     Comprehensive metabolic panel with GFR  Macrocytic anemia -     Vitamin B12 -     CBC with Differential/Platelet  Elevated cholesterol -     Comprehensive metabolic panel with GFR -     Lipid panel    Return in about 6 months (around 04/27/2024), or if symptoms worsen or fail to improve.  Adjustments made pending results.  Elsie Sim Lent, MD  7/31 addendum: A1c increased.  increased empagliflozin  to 25 mg daily.

## 2023-10-27 LAB — VITAMIN B12: Vitamin B-12: 706 pg/mL (ref 211–911)

## 2023-10-28 ENCOUNTER — Ambulatory Visit: Payer: Self-pay | Admitting: Family Medicine

## 2023-10-28 ENCOUNTER — Other Ambulatory Visit (HOSPITAL_COMMUNITY): Payer: Self-pay

## 2023-10-28 MED ORDER — EMPAGLIFLOZIN 25 MG PO TABS
25.0000 mg | ORAL_TABLET | Freq: Every day | ORAL | 2 refills | Status: AC
  Filled 2023-10-28 – 2023-11-01 (×2): qty 90, 90d supply, fill #0

## 2023-10-28 NOTE — Addendum Note (Signed)
 Addended by: BERNETA ELSIE LABOR on: 10/28/2023 08:26 AM   Modules accepted: Orders

## 2023-10-29 ENCOUNTER — Other Ambulatory Visit (HOSPITAL_COMMUNITY): Payer: Self-pay

## 2023-11-01 ENCOUNTER — Other Ambulatory Visit: Payer: Self-pay

## 2023-11-01 ENCOUNTER — Other Ambulatory Visit (HOSPITAL_COMMUNITY): Payer: Self-pay

## 2023-11-01 ENCOUNTER — Other Ambulatory Visit (HOSPITAL_BASED_OUTPATIENT_CLINIC_OR_DEPARTMENT_OTHER): Payer: Self-pay

## 2023-11-02 ENCOUNTER — Other Ambulatory Visit (HOSPITAL_COMMUNITY): Payer: Self-pay

## 2023-12-16 ENCOUNTER — Other Ambulatory Visit (HOSPITAL_COMMUNITY): Payer: Self-pay

## 2023-12-23 DIAGNOSIS — M65351 Trigger finger, right little finger: Secondary | ICD-10-CM | POA: Diagnosis not present

## 2023-12-23 DIAGNOSIS — Z4789 Encounter for other orthopedic aftercare: Secondary | ICD-10-CM | POA: Diagnosis not present

## 2023-12-29 DIAGNOSIS — H353221 Exudative age-related macular degeneration, left eye, with active choroidal neovascularization: Secondary | ICD-10-CM | POA: Diagnosis not present

## 2024-01-05 ENCOUNTER — Ambulatory Visit

## 2024-01-28 ENCOUNTER — Ambulatory Visit (INDEPENDENT_AMBULATORY_CARE_PROVIDER_SITE_OTHER)

## 2024-01-28 DIAGNOSIS — Z23 Encounter for immunization: Secondary | ICD-10-CM

## 2024-01-28 NOTE — Progress Notes (Addendum)
 After obtaining consent, and per orders of Tinnie Harada, NP, High Dose Flu Shot given to pt in right deltoid by Avelina Finder. Patient tolerated the injection well and was instructed to report any adverse reaction.

## 2024-01-31 ENCOUNTER — Other Ambulatory Visit (HOSPITAL_COMMUNITY): Payer: Self-pay

## 2024-02-15 ENCOUNTER — Telehealth: Payer: Self-pay

## 2024-02-15 NOTE — Progress Notes (Signed)
   02/15/2024  Patient ID: Steven Yates. Scarfo, male   DOB: March 30, 1941, 83 y.o.   MRN: [id number]  This patient is appearing on a report for being at risk of failing the adherence measure for diabetes medications this calendar year.   Medication: metformin  XR 1000mg  BID Last fill date: 02/01/24 for 90 day supply  Insurance report was not up to date. No action needed at this time.   Channing DELENA Mealing, PharmD, DPLA

## 2024-02-23 NOTE — Progress Notes (Deleted)
 Cardiology Office Note:    Date:  02/23/2024   ID:  Steven Yates, Steven Yates 1940-09-06, MRN [id number]  PCP:  Berneta Elsie Sayre, MD  Cardiologist:  Maple Odaniel, MD  Electrophysiologist:  None   Referring MD: Berneta Elsie Sayre DEWAINE   No chief complaint on file.   History of Present Illness:    Steven Yates is a 83 y.o. male seen for evaluation of SOB. He has a hx of CAD status post CTO PCI to LAD with DES x 2 in 2016, hypertension, T2DM, hyperlipidemia. He is a former patient of Dr. Claudene.    Most recent cath 09/01/2016 showed mild to moderate in-stent restenosis in LAD stent, mild nonobstructive disease in LCx and RCA.  He was seen in Jan 2024 by Dr Kate - noted dyspnea at that time. Echo was normal and Myoview  was low risk. Dr Kate reviewed and did not see evidence of ischemia. Significantly improved from 2015.   When I saw him in July 2024 he was doing well. In October he presented with worsening DOE. He underwent repeat cardiac cath showing severe focal in stent restenosis at the ostium of the LAD. This was treated with scoring balloon PCI with IVUS guidance. On follow up post procedure he noted a significant improvement.   On follow up today he is feeling well. He states he has some SOB every once in awhile but nothing like prior to his PCI. No chest pain. Does not monitor BP at home although has a cuff. Still working. Does some yard work. Just got over a case of bronchitis. States he quit taking amlodipine  because it made him sick.   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 Claudene   CARDIAC CATHETERIZATION  04/04/2014   Procedure: CORONARY/BYPASS GRAFT CTO INTERVENTION;  Surgeon: Onie Kasparek M Kariel Skillman, MD;  Location: [facility] CATH LAB;  Service: Cardiovascular;;   CARPAL TUNNEL RELEASE     bil   2019   CHOLECYSTECTOMY  2010   CORONARY ANGIOPLASTY WITH STENT PLACEMENT  04/04/2014   2   CORONARY BALLOON ANGIOPLASTY N/A 01/19/2023   Procedure: CORONARY BALLOON ANGIOPLASTY;  Surgeon: Katy Brickell M, MD;  Location: [facility] INVASIVE CV LAB;  Service: Cardiovascular;  Laterality: N/A;   CORONARY PRESSURE/FFR STUDY N/A 09/01/2016   Procedure: Intravascular Pressure Wire/FFR Study;  Surgeon: Claudene Victory ORN, MD;  Location: [facility] INVASIVE CV LAB;  Service: Cardiovascular;  Laterality: N/A;   CORONARY ULTRASOUND/IVUS N/A 01/19/2023   Procedure: Coronary Ultrasound/IVUS;  Surgeon: Carlynn Leduc M, MD;  Location: [facility] INVASIVE CV LAB;  Service: Cardiovascular;  Laterality: N/A;   INGUINAL HERNIA REPAIR Bilateral 1966?   LEFT HEART CATH AND CORONARY ANGIOGRAPHY N/A 09/01/2016   Procedure: Left Heart Cath and Coronary Angiography;  Surgeon: Claudene Victory ORN, MD;  Location: [facility] INVASIVE CV LAB;  Service: Cardiovascular;  Laterality: N/A;   LEFT HEART CATH AND CORONARY ANGIOGRAPHY N/A 01/19/2023   Procedure: LEFT HEART CATH AND CORONARY ANGIOGRAPHY;  Surgeon: Ellese Julius M, MD;  Location: [facility] INVASIVE CV LAB;  Service: Cardiovascular;  Laterality: N/A;   LEFT HEART CATHETERIZATION WITH CORONARY ANGIOGRAM N/A 01/30/2014   Procedure: LEFT HEART CATHETERIZATION WITH CORONARY ANGIOGRAM;  Surgeon: Victory LELON Claudene DOUGLAS, MD;  Location: [facility] CATH LAB;  Service: Cardiovascular;  Laterality: N/A;   LUMBAR DISC SURGERY  1980's X 2   ruptured disc   PERCUTANEOUS CORONARY STENT INTERVENTION (PCI-S)  01/30/2014   Procedure: PERCUTANEOUS CORONARY STENT INTERVENTION (PCI-S);  Surgeon: Victory LELON Claudene DOUGLAS, MD;  Location: [facility] CATH LAB;  Service: Cardiovascular;;   SHOULDER OPEN ROTATOR CUFF REPAIR Right  1990's   TOTAL KNEE ARTHROPLASTY Left 06/06/2018   Procedure: TOTAL KNEE ARTHROPLASTY;  Surgeon: Melodi Lerner, MD;  Location: [facility] ORS;  Service: Orthopedics;  Laterality: Left;    ULTRASOUND GUIDANCE FOR VASCULAR ACCESS  09/01/2016   Procedure: Ultrasound Guidance For Vascular Access;  Surgeon: Claudene Victory LELON, MD;  Location: [facility] INVASIVE CV LAB;  Service: Cardiovascular;;    Current Medications: No outpatient medications have been marked as taking for the 02/29/24 encounter (Appointment) with Druanne Bosques M, MD.     Allergies:   Lisinopril  and Tape   Social History   Socioeconomic History   Marital status: Married    Spouse name: Not on file   Number of children: 2   Years of education: Not on file   Highest education level: 12th grade  Occupational History   Occupation: [employer]  Tobacco Use   Smoking status: Former    Types: Cigars    Quit date: 04/29/2018    Years since quitting: 5.8   Smokeless tobacco: Never  Vaping Use   Vaping status: Never Used  Substance and Sexual Activity   Alcohol use: No    Alcohol/week: 0.0 standard drinks of alcohol   Drug use: No   Sexual activity: Yes  Other Topics Concern   Not on file  Social History Narrative   Not on file   Social Drivers of Health   Financial Resource Strain: Low Risk  (10/12/2023)   Overall Financial Resource Strain (CARDIA)    Difficulty of Paying Living Expenses: Not very hard  Food Insecurity: No Food Insecurity (10/12/2023)   Hunger Vital Sign    Worried About Running Out of Food in the Last Year: Never true    Ran Out of Food in the Last Year: Never true  Transportation Needs: No Transportation Needs (10/12/2023)   PRAPARE - Administrator, Civil Service (Medical): No    Lack of Transportation (Non-Medical): No  Physical Activity: Insufficiently Active (10/12/2023)   Exercise Vital Sign    Days of Exercise per Week: 3 days    Minutes of Exercise per Session: 30 min  Stress: No  Stress Concern Present (10/12/2023)   [employer] of Occupational Health - Occupational Stress Questionnaire    Feeling of Stress: Not at all  Social Connections: Moderately Integrated (10/12/2023)   Social Connection and Isolation Panel    Frequency of Communication with Friends and Family: More than three times a week    Frequency of Social Gatherings with Friends and Family: More than three times a week    Attends Religious Services: More than 4 times per year    Active Member of [employer] or Organizations: No    Attends Engineer, Structural: Not on file    Marital Status: Married     Family History: The patient's family history includes Cancer in his brother; Emphysema in his father;  Heart disease in his brother and mother; Other in his sister.  ROS:   Please see the history of present illness.     All other systems reviewed and are negative.  EKGs/Labs/Other Studies Reviewed:    The following studies were reviewed today:  Echo 04/24/22: IMPRESSIONS     1. Left ventricular ejection fraction, by estimation, is 60 to 65%. The  left ventricle has normal function. The left ventricle has no regional  wall motion abnormalities. Left ventricular diastolic parameters are  consistent with Grade I diastolic  dysfunction (impaired relaxation).   2. Right ventricular systolic function is normal. The right ventricular  size is normal. There is normal pulmonary artery systolic pressure.   3. Left atrial size was mildly dilated.   4. The mitral valve is normal in structure. Trivial mitral valve  regurgitation. No evidence of mitral stenosis.   5. The aortic valve is tricuspid. Aortic valve regurgitation is not  visualized. No aortic stenosis is present.   6. The inferior vena cava is normal in size with greater than 50%  respiratory variability, suggesting right atrial pressure of 3 mmHg.   Myoview  04/24/22: Study Highlights      Findings are consistent with infarction with  peri-infarct ischemia. The study is intermediate risk.   No ST deviation was noted.   Left ventricular function is normal. Nuclear stress EF: 56 %. The left ventricular ejection fraction is normal (55-65%). End diastolic cavity size is normal.   Prior study available for comparison from 01/29/2014.   small size and mild intensity apical/apical anterior fixed lesion c/w possible scar with peri-infarct ischemia.  Prior nuclear study was low risk, do not have the images to compare.    Cardiac cath 01/19/23:  LEFT HEART CATH AND CORONARY ANGIOGRAPHY  CORONARY BALLOON ANGIOPLASTY  Coronary Ultrasound/IVUS   Conclusion      Prox RCA lesion is 30% stenosed.   Prox LAD lesion is 90% stenosed.   Previously placed Prox LAD to Mid LAD stent of unknown type is  widely patent.   Scoring balloon angioplasty was performed using a BALLN SCOREFLEX 3.0X10.   Post intervention, there is a 0% residual stenosis.   LV end diastolic pressure is severely elevated.   Recommend uninterrupted dual antiplatelet therapy with Aspirin  81mg  daily and Clopidogrel  75mg  daily for a minimum of 6 months (stable ischemic heart disease-Class I recommendation).   Single vessel obstructive CAD involving the proximal LAD with focal in stent restenosis High LVEDP Successful PCI of the proximal LAD with scoring balloon with IVUS guidance   Plan: DAPT for 6 months with ASA and Plavix . Needs diuresis. Strongly consider adding SGLT 2 inhibitor. Patient is a candidate for same day DC  Coronary Diagrams  Diagnostic Dominance: Right  Intervention           Recent Labs: 10/26/2023: ALT 16; BUN 15; Creatinine, Ser 0.96; Hemoglobin 12.5; Platelets 132.0; Potassium 4.1; Sodium 137  Recent Lipid Panel    Component Value Date/Time   CHOL 95 10/26/2023 0954   TRIG 108.0 10/26/2023 0954   HDL 33.60 (L) 10/26/2023 0954   CHOLHDL 3 10/26/2023 0954   VLDL 21.6 10/26/2023 0954   LDLCALC 40 10/26/2023 0954   LDLDIRECT 41.0  04/15/2023 0938    Physical Exam:    VS:  There were no vitals taken for this visit.    Wt Readings from Last 3 Encounters:  10/26/23 190 lb 6.4 oz (86.4 kg)  10/08/23 189 lb 3.2 oz (85.8 kg)  09/17/23 193 lb 6.4 oz (87.7 kg)     GEN:  Well nourished, well developed in no acute distress HEENT: Normal NECK: No JVD; No carotid bruits LYMPHATICS: No lymphadenopathy CARDIAC: RRR, no murmurs, rubs, gallops RESPIRATORY:  Clear to auscultation without rales, wheezing or rhonchi  ABDOMEN: Soft, non-tender, non-distended MUSCULOSKELETAL:  No edema; No deformity  SKIN: Warm and dry NEUROLOGIC:  Alert and oriented x 3 PSYCHIATRIC:  Normal affect   ASSESSMENT:    No diagnosis found.     PLAN:    1.   CAD:  status post CTO PCI to LAD with DES x 2 in 2016.  Cath 09/01/2016 showed mild to moderate in-stent restenosis in LAD stent, mild nonobstructive disease in LCx and RCA - Myoview  in Jan 2024 with small scar minimal ischemia. Low risk. Echo normal - progressive DOE in October 2024 with severe focal in stent restenosis in the ostial LAD treated with Scoring balloon angioplasty. Excellent response with resolution of symptoms. OK to discontinue Plavix  at this point.   2.   Hypertension: On losartan  100 mg daily. BP is mildly elevated today. Intolerant of amlodipine . I asked him to keep a diary of BP at home so when he sees his PCP back can review and see if additional therapy needed.   3.   Hyperlipidemia: On atorvastatin  40mg  daily.  LDL 41   Follow-up in 6 months.  No orders of the defined types were placed in this encounter.  No orders of the defined types were placed in this encounter.   There are no Patient Instructions on file for this visit.   Signed, Chelise Hanger, MD  02/23/2024 7:57 AM    [facility]

## 2024-02-29 ENCOUNTER — Ambulatory Visit: Admitting: Cardiology

## 2024-03-15 ENCOUNTER — Other Ambulatory Visit (HOSPITAL_COMMUNITY): Payer: Self-pay

## 2024-04-27 ENCOUNTER — Ambulatory Visit: Admitting: Family Medicine

## 2024-05-12 ENCOUNTER — Ambulatory Visit: Admitting: Cardiology

## 2024-05-26 ENCOUNTER — Ambulatory Visit: Admitting: Family Medicine
